# Patient Record
Sex: Female | Born: 1940
Health system: Southern US, Community
[De-identification: ages and names within clinical notes are randomized; demographics above are authoritative.]

## PROBLEM LIST (undated history)

## (undated) DIAGNOSIS — M1990 Unspecified osteoarthritis, unspecified site: Secondary | ICD-10-CM

## (undated) DIAGNOSIS — Z860100 Personal history of colon polyps, unspecified: Secondary | ICD-10-CM

## (undated) DIAGNOSIS — Q428 Congenital absence, atresia and stenosis of other parts of large intestine: Secondary | ICD-10-CM

## (undated) DIAGNOSIS — E7800 Pure hypercholesterolemia, unspecified: Secondary | ICD-10-CM

## (undated) DIAGNOSIS — I441 Atrioventricular block, second degree: Secondary | ICD-10-CM

## (undated) DIAGNOSIS — K589 Irritable bowel syndrome without diarrhea: Secondary | ICD-10-CM

## (undated) DIAGNOSIS — M810 Age-related osteoporosis without current pathological fracture: Secondary | ICD-10-CM

## (undated) DIAGNOSIS — M542 Cervicalgia: Secondary | ICD-10-CM

## (undated) DIAGNOSIS — Z973 Presence of spectacles and contact lenses: Secondary | ICD-10-CM

## (undated) DIAGNOSIS — I471 Supraventricular tachycardia, unspecified: Secondary | ICD-10-CM

## (undated) DIAGNOSIS — K449 Diaphragmatic hernia without obstruction or gangrene: Secondary | ICD-10-CM

## (undated) DIAGNOSIS — Z8719 Personal history of other diseases of the digestive system: Secondary | ICD-10-CM

## (undated) DIAGNOSIS — I10 Essential (primary) hypertension: Secondary | ICD-10-CM

## (undated) DIAGNOSIS — G4733 Obstructive sleep apnea (adult) (pediatric): Secondary | ICD-10-CM

## (undated) DIAGNOSIS — K219 Gastro-esophageal reflux disease without esophagitis: Secondary | ICD-10-CM

## (undated) HISTORY — DX: Essential (primary) hypertension: I10

## (undated) HISTORY — PX: CATARACT EXTRACTION W/ INTRAOCULAR LENS  IMPLANT, BILATERAL: SHX1307

## (undated) HISTORY — DX: Pure hypercholesterolemia, unspecified: E78.00

## (undated) HISTORY — PX: TUBAL LIGATION: SHX77

## (undated) HISTORY — DX: Age-related osteoporosis without current pathological fracture: M81.0

## (undated) HISTORY — PX: FOOT SURGERY: SHX648

## (undated) HISTORY — PX: COLONOSCOPY: SHX174

## (undated) HISTORY — DX: Unspecified osteoarthritis, unspecified site: M19.90

## (undated) HISTORY — PX: OTHER SURGICAL HISTORY: SHX169

## (undated) HISTORY — DX: Cervicalgia: M54.2

## (undated) HISTORY — PX: ESOPHAGOGASTRODUODENOSCOPY: SHX1529

## (undated) HISTORY — DX: Diaphragmatic hernia without obstruction or gangrene: K44.9

---

## 1998-05-15 ENCOUNTER — Other Ambulatory Visit: Admission: RE | Admit: 1998-05-15 | Discharge: 1998-05-15 | Payer: Self-pay

## 2000-10-06 ENCOUNTER — Ambulatory Visit (HOSPITAL_COMMUNITY): Admission: RE | Admit: 2000-10-06 | Discharge: 2000-10-06 | Payer: Self-pay | Admitting: Gastroenterology

## 2000-10-18 ENCOUNTER — Encounter: Admission: RE | Admit: 2000-10-18 | Discharge: 2000-10-18 | Payer: Self-pay | Admitting: Gastroenterology

## 2000-10-18 ENCOUNTER — Encounter: Payer: Self-pay | Admitting: Gastroenterology

## 2000-11-24 ENCOUNTER — Other Ambulatory Visit: Admission: RE | Admit: 2000-11-24 | Discharge: 2000-11-24 | Payer: Self-pay | Admitting: Gynecology

## 2000-11-24 ENCOUNTER — Encounter (INDEPENDENT_AMBULATORY_CARE_PROVIDER_SITE_OTHER): Payer: Self-pay

## 2001-01-19 ENCOUNTER — Encounter: Payer: Self-pay | Admitting: Gastroenterology

## 2001-01-19 ENCOUNTER — Encounter: Admission: RE | Admit: 2001-01-19 | Discharge: 2001-01-19 | Payer: Self-pay | Admitting: Gastroenterology

## 2001-10-19 ENCOUNTER — Encounter: Admission: RE | Admit: 2001-10-19 | Discharge: 2001-10-19 | Payer: Self-pay | Admitting: Family Medicine

## 2001-10-19 ENCOUNTER — Encounter: Payer: Self-pay | Admitting: Family Medicine

## 2001-12-04 ENCOUNTER — Encounter: Admission: RE | Admit: 2001-12-04 | Discharge: 2001-12-04 | Payer: Self-pay | Admitting: Neurosurgery

## 2001-12-04 ENCOUNTER — Encounter: Payer: Self-pay | Admitting: Neurosurgery

## 2002-01-16 ENCOUNTER — Encounter: Admission: RE | Admit: 2002-01-16 | Discharge: 2002-01-16 | Payer: Self-pay | Admitting: Neurosurgery

## 2002-01-16 ENCOUNTER — Encounter: Payer: Self-pay | Admitting: Neurosurgery

## 2002-01-31 ENCOUNTER — Encounter: Payer: Self-pay | Admitting: Neurosurgery

## 2002-01-31 ENCOUNTER — Encounter: Admission: RE | Admit: 2002-01-31 | Discharge: 2002-01-31 | Payer: Self-pay | Admitting: Neurosurgery

## 2002-02-13 ENCOUNTER — Other Ambulatory Visit: Admission: RE | Admit: 2002-02-13 | Discharge: 2002-02-13 | Payer: Self-pay | Admitting: Gynecology

## 2003-03-06 ENCOUNTER — Other Ambulatory Visit: Admission: RE | Admit: 2003-03-06 | Discharge: 2003-03-06 | Payer: Self-pay | Admitting: Gynecology

## 2004-01-01 ENCOUNTER — Ambulatory Visit (HOSPITAL_BASED_OUTPATIENT_CLINIC_OR_DEPARTMENT_OTHER): Admission: RE | Admit: 2004-01-01 | Discharge: 2004-01-01 | Payer: Self-pay | Admitting: Family Medicine

## 2004-01-13 ENCOUNTER — Ambulatory Visit (HOSPITAL_BASED_OUTPATIENT_CLINIC_OR_DEPARTMENT_OTHER): Admission: RE | Admit: 2004-01-13 | Discharge: 2004-01-13 | Payer: Self-pay | Admitting: Family Medicine

## 2004-04-14 ENCOUNTER — Other Ambulatory Visit: Admission: RE | Admit: 2004-04-14 | Discharge: 2004-04-14 | Payer: Self-pay | Admitting: Gynecology

## 2004-07-07 ENCOUNTER — Other Ambulatory Visit: Admission: RE | Admit: 2004-07-07 | Discharge: 2004-07-07 | Payer: Self-pay | Admitting: Gynecology

## 2005-05-23 ENCOUNTER — Other Ambulatory Visit: Admission: RE | Admit: 2005-05-23 | Discharge: 2005-05-23 | Payer: Self-pay | Admitting: Gynecology

## 2005-06-30 ENCOUNTER — Ambulatory Visit (HOSPITAL_COMMUNITY): Admission: RE | Admit: 2005-06-30 | Discharge: 2005-06-30 | Payer: Self-pay | Admitting: Orthopedic Surgery

## 2005-06-30 ENCOUNTER — Ambulatory Visit (HOSPITAL_BASED_OUTPATIENT_CLINIC_OR_DEPARTMENT_OTHER): Admission: RE | Admit: 2005-06-30 | Discharge: 2005-06-30 | Payer: Self-pay | Admitting: Orthopedic Surgery

## 2007-05-22 ENCOUNTER — Encounter: Admission: RE | Admit: 2007-05-22 | Discharge: 2007-05-22 | Payer: Self-pay | Admitting: Orthopedic Surgery

## 2010-10-06 ENCOUNTER — Encounter
Admission: RE | Admit: 2010-10-06 | Discharge: 2010-10-06 | Payer: Self-pay | Source: Home / Self Care | Attending: Family Medicine | Admitting: Family Medicine

## 2010-10-12 ENCOUNTER — Encounter
Admission: RE | Admit: 2010-10-12 | Discharge: 2010-10-12 | Payer: Self-pay | Source: Home / Self Care | Attending: Family Medicine | Admitting: Family Medicine

## 2011-02-11 NOTE — Op Note (Signed)
NAMEGUSTIE, Erika [name]                    ACCOUNT NO.:  [id number]   MEDICAL RECORD NO.:  [id number]          PATIENT TYPE:  AMB   LOCATION:  DSC                          FACILITY:  [facility]   PHYSICIAN:  Nadara Mustard, MD     DATE OF BIRTH:  1941-01-24   DATE OF PROCEDURE:  06/30/2005  DATE OF DISCHARGE:                                 OPERATIVE REPORT   PREOPERATIVE DIAGNOSIS:  1.  Right foot bunion, right great toe.  2.  Clawing of the right second toe.   PROCEDURE:  1.  Right first metatarsal Ludloff osteotomy.  2.  Right first proximal phalanx Aiken osteotomy.  3.  Second metatarsal Weil osteotomy.   SURGEON:  Nadara Mustard, M.D.   ANESTHESIA:  Popliteal block plus general.   ESTIMATED BLOOD LOSS:  Minimal.   ANTIBIOTICS:  1 gram of Kefzol.   TOURNIQUET TIME:  None.   DISPOSITION:  To PACU in stable condition.   INDICATIONS FOR PROCEDURE:  The patient is a 70 year old woman with a severe  hallux valgus deformity of the great toe on the right foot and clawing of  the second toe. She was overlapping the great toe over the second toe. She  has inner metatarsal angle of 20 degrees, hallux valgus angle of 40 degrees.  She has failed conservative care and presents at this time for surgical  intervention. The risks and benefits were discussed including infection,  neurovascular injury, persistent pain, need for additional surgery. The  patient states he understands and wished proceed at this time.   DESCRIPTION OF PROCEDURE:  The patient was brought to OR room #1 after  undergoing a popliteal block. The patient then underwent general anesthetic.  After adequate level of anesthesia obtained, the patient's right lower  extremity was prepped using DuraPrep and draped into a sterile field. A  medial longitudinal incision was made over the first metatarsal and base of  the proximal phalanx. An ostectomy was first performed and then a Ludloff  osteotomy was performed. This was rotated  and secured with 2.7 screws x2 and  this was lag screw technique to stabilize the rotation osteotomy. The  incision was made over the first web space to release the soft tissue  laterally to allow for the rotation of the Ludloff osteotomy. After securing  the osteotomy, the remainder of the overlapping bone was removed with the  saw. The wound was irrigated with normal saline. The capsule was closed  using 2-0 Vicryl. Skin was closed using 3-0 nylon with a 4 far-near-near-far  suture. A Aiken osteotomy was performed over the proximal phalanx. A medial  wedge was removed. The toe was straightened and a 1.6 mm K-wire was used to  stabilize the Aiken osteotomy. Attention was then focused on the second toe.  A Weil osteotomy was performed over the second metatarsal. This was secured  with a 2 mm screw 12 mm in length. The wound was irrigated. The incision  was closed using 3-0 nylon. The wounds were covered with Adaptic orthopedic  sponges, sterile Webril and a  Coban dressing. The patient was extubated,  taken to PACU in stable condition. Plan for discharge to home ice,  elevation, touchdown weightbearing on the right with crutches. Follow-up in  the office in two weeks.      Nadara Mustard, MD  Electronically Signed     MVD/MEDQ  D:  06/30/2005  T:  06/30/2005  Job:  [id number]

## 2011-02-11 NOTE — Procedures (Signed)
[facility]  Patient:    Erika [name], Erika [name]                             MRN: [id number] Proc. Date: 10/06/00 Adm. Date:  [id number] Attending:  Nelda Marseille CC:         Hadassah Pais. [name], M.D.   Procedure Report  PROCEDURE:  Colonoscopy.  INDICATION:  Mild change in bowel habits, abdominal pain since August. Consent was signed after risks, benefits, methods, and options thoroughly discussed in the office.  MEDICINES USED:  Demerol 50 mg, Versed 5 mg.  DESCRIPTION OF PROCEDURE:  Rectal inspection is pertinent for external hemorrhoids.  Digital exam was negative.  Video colonoscope was inserted and with mild difficulty due to a tortuous colon was able to advance to the cecum, which required rolling her on her back and some abdominal pressure.  Cecum was identified by the appendiceal orifice and the ileocecal valve.  In fact, the scope was inserted a short way into the terminal ileum, which was normal. Photo documentation was obtained.  No obvious abnormality was seen on insertion.  The scope was slowly withdrawn.  On slow withdrawal through the colon, the prep was good.  There were some bubbles that required washing with Mylicon wash and another little bit of liquid stool that required washing and suctioning, but on slow withdrawal through the colon no abnormalities were seen except for a rare diverticulum in both the transverse and the descending. There were no masses, polyps, or other abnormalities.  Once back in the rectum, the scope was then retroflexed, pertinent for some internal hemorrhoids.  The scope was straightened, readvanced a short way up the sigmoid, air was suctioned, and the scope removed.  The patient tolerated the procedure well.  There was no obvious immediate complication.  ENDOSCOPIC DIAGNOSES: 1. Internal-external hemorrhoids. 2. Rare transverse and descending diverticula seen. 3. Otherwise within normal limits to the  terminal ileum.  PLAN:  Repeat screening in five to 10 years.  Yearly rectals and guaiacs per Dr. [name].  Probably would get a CT scan next.  Happy to see back p.r.n. Possibly this pain is just due to adhesions, and might need if CT scan negative a one-time upper GI/small bowel follow-through. DD:  10/06/00 TD:  10/07/00 Job: [id number]

## 2011-03-22 ENCOUNTER — Other Ambulatory Visit: Payer: Self-pay | Admitting: Family Medicine

## 2011-03-22 DIAGNOSIS — R921 Mammographic calcification found on diagnostic imaging of breast: Secondary | ICD-10-CM

## 2011-03-29 ENCOUNTER — Ambulatory Visit
Admission: RE | Admit: 2011-03-29 | Discharge: 2011-03-29 | Disposition: A | Discharge status: Home / Self Care | Payer: Medicare Other | Source: Ambulatory Visit | Attending: Family Medicine | Admitting: Family Medicine

## 2011-03-29 DIAGNOSIS — R921 Mammographic calcification found on diagnostic imaging of breast: Secondary | ICD-10-CM

## 2011-08-30 ENCOUNTER — Other Ambulatory Visit: Payer: Self-pay | Admitting: Family Medicine

## 2011-08-30 DIAGNOSIS — R921 Mammographic calcification found on diagnostic imaging of breast: Secondary | ICD-10-CM

## 2011-10-25 ENCOUNTER — Ambulatory Visit
Admission: RE | Admit: 2011-10-25 | Discharge: 2011-10-25 | Disposition: A | Discharge status: Home / Self Care | Payer: Medicare Other | Source: Ambulatory Visit | Attending: Family Medicine | Admitting: Family Medicine

## 2011-10-25 DIAGNOSIS — R921 Mammographic calcification found on diagnostic imaging of breast: Secondary | ICD-10-CM

## 2012-02-02 ENCOUNTER — Other Ambulatory Visit: Payer: Self-pay | Admitting: Gynecology

## 2012-02-02 ENCOUNTER — Encounter: Payer: Self-pay | Admitting: Gynecology

## 2012-02-02 ENCOUNTER — Ambulatory Visit (INDEPENDENT_AMBULATORY_CARE_PROVIDER_SITE_OTHER): Payer: Medicare Other | Admitting: Gynecology

## 2012-02-02 VITALS — BP 140/82 | Ht 63.0 in | Wt 158.0 lb

## 2012-02-02 DIAGNOSIS — N949 Unspecified condition associated with female genital organs and menstrual cycle: Secondary | ICD-10-CM

## 2012-02-02 DIAGNOSIS — R1031 Right lower quadrant pain: Secondary | ICD-10-CM

## 2012-02-02 DIAGNOSIS — Z780 Asymptomatic menopausal state: Secondary | ICD-10-CM

## 2012-02-02 DIAGNOSIS — R5381 Other malaise: Secondary | ICD-10-CM

## 2012-02-02 DIAGNOSIS — R5383 Other fatigue: Secondary | ICD-10-CM

## 2012-02-02 LAB — URINALYSIS W MICROSCOPIC + REFLEX CULTURE
Crystals: NONE SEEN
Ketones, ur: NEGATIVE mg/dL
Nitrite: NEGATIVE
Protein, ur: NEGATIVE mg/dL
Specific Gravity, Urine: <1.005 — ABNORMAL LOW (ref 1.005–1.030)
Urobilinogen, UA: 0.2 mg/dL (ref 0.0–1.0)

## 2012-02-02 NOTE — Patient Instructions (Addendum)
Follow up for ultrasound and bone density study.

## 2012-02-02 NOTE — Progress Notes (Signed)
Erika [name] 1940-12-23 [id number]        71 y.o.  Presents having not been seen in the office for a number of years complaining of right lower quadrant pain. Patient notes over the last 6 months or so a nagging right lower quadrant discomfort on a daily basis. She is being followed for IBS by Dr. Claretha [name]. She reports having a recent colonoscopy last fall. No weight gain weight loss nausea vomiting diarrhea constipation associated with the pain. No urinary symptoms. I asked her about diverticulitis or diverticulosis and she's never been diagnosed with this.  Recently saw Dr. Jeannetta [name] and had blood work drawn but is not sure of the results. She is noting some fatigue over the past year also although is very active taking care of her grandchildren.  Past medical history,surgical history, medications, allergies, family history and social history were all reviewed and documented in the EPIC chart. ROS:  Was performed and pertinent positives and negatives are included in the history.  Exam: Erika [name] chaperone present Filed Vitals:   02/02/12 1100  BP: 140/82   General appearance  Normal Skin grossly normal Head/Neck normal with no cervical or supraclavicular adenopathy thyroid normal Lungs  clear Cardiac RR, without RMG Abdominal  soft, mild tenderness over McBurney's point, without masses, organomegaly or hernia Breasts  examined lying and sitting without masses, retractions, discharge or axillary adenopathy. Pelvic  Ext/BUS/vagina  normal with atrophic genital changes  Cervix  normal   Uterus  axial, normal size, shape and contour, midline and mobile nontender   Adnexa  Without masses or tenderness    Anus and perineum  normal   Rectovaginal  normal sphincter tone without palpated masses or tenderness.    Assessment/Plan:  71 y.o. with  1. 6 months of right lower quadrant discomfort over McBurney's point. No acute changes.  Is actively being followed for IBS with recent colonoscopy. Will start with  GYN ultrasound to rule out nonpalpable abnormalities. Assuming negative I have asked her to see Dr. Claretha [name] in follow up to see if any other tests such as CT scan would be appropriate for her and his recommendation.  A urinalysis today did show some trace bacteria and will follow up with a culture and treat if appropriate 2. Mammogram. Patient had her mammography in January has been followed at a six-month interval 4 stable changes. She'll continue follow up with them per their recommendation. SBE monthly reviewed. 3. Pap smear. Patient has no history of abnormal Pap smears and had historically been getting them up until the last several years. Discussed current screening guidelines and a Pap smear was done today as she is over the age of 32 and we'll plan on no further screening. 4. DEXA. Patient had a DEXA historically 8 years ago. We'll repeat now and she will schedule. Increase calcium vitamin D reviewed. 5. Fatigue. She's not sure what blood work Dr. Jeannetta [name] ordered and I recommended a CBC and TSH and she can follow up for this. 6. Health maintenance. No other blood work was done as a result on for Dr. Milus [name] office who she sees a regular basis. To follow up with me for her ultrasound and we'll go from there.   Erika Lords MD, 11:24 AM 02/02/2012

## 2012-02-02 NOTE — Progress Notes (Signed)
Addended by: [name] on: 02/02/2012 11:46 AM   Modules accepted: Orders

## 2012-02-04 LAB — URINE CULTURE: Organism ID, Bacteria: NO GROWTH

## 2012-02-07 ENCOUNTER — Ambulatory Visit (INDEPENDENT_AMBULATORY_CARE_PROVIDER_SITE_OTHER): Payer: Medicare Other

## 2012-02-07 DIAGNOSIS — M8580 Other specified disorders of bone density and structure, unspecified site: Secondary | ICD-10-CM

## 2012-02-07 DIAGNOSIS — Z780 Asymptomatic menopausal state: Secondary | ICD-10-CM

## 2012-02-07 DIAGNOSIS — M949 Disorder of cartilage, unspecified: Secondary | ICD-10-CM

## 2012-02-08 ENCOUNTER — Telehealth: Payer: Self-pay | Admitting: Gynecology

## 2012-02-08 DIAGNOSIS — R5383 Other fatigue: Secondary | ICD-10-CM

## 2012-02-08 DIAGNOSIS — M8580 Other specified disorders of bone density and structure, unspecified site: Secondary | ICD-10-CM

## 2012-02-08 NOTE — Telephone Encounter (Signed)
Patient's DEXA showed osteopenia with an indication to consider medication treatment. Asked patient to make appointment to see me for discussion. She also needs to have the CBC and TSH done that apparently was not drawn at her visit as well as a vitamin D level. I put in for the vitamin D and the CBC and TSH are listed as and overdo result.  Have these done before her office visit to see me.

## 2012-02-08 NOTE — Telephone Encounter (Signed)
Pt informed with the below note. 

## 2012-02-10 ENCOUNTER — Ambulatory Visit (INDEPENDENT_AMBULATORY_CARE_PROVIDER_SITE_OTHER): Payer: Medicare Other

## 2012-02-10 ENCOUNTER — Encounter: Payer: Self-pay | Admitting: Gynecology

## 2012-02-10 ENCOUNTER — Ambulatory Visit (INDEPENDENT_AMBULATORY_CARE_PROVIDER_SITE_OTHER): Payer: Medicare Other | Admitting: Gynecology

## 2012-02-10 DIAGNOSIS — E559 Vitamin D deficiency, unspecified: Secondary | ICD-10-CM

## 2012-02-10 DIAGNOSIS — R5383 Other fatigue: Secondary | ICD-10-CM

## 2012-02-10 DIAGNOSIS — R1031 Right lower quadrant pain: Secondary | ICD-10-CM

## 2012-02-10 DIAGNOSIS — M899 Disorder of bone, unspecified: Secondary | ICD-10-CM

## 2012-02-10 DIAGNOSIS — R5381 Other malaise: Secondary | ICD-10-CM

## 2012-02-10 DIAGNOSIS — M8580 Other specified disorders of bone density and structure, unspecified site: Secondary | ICD-10-CM

## 2012-02-10 LAB — CBC WITH DIFFERENTIAL/PLATELET
Basophils Absolute: 0 10*3/uL (ref 0.0–0.1)
Basophils Relative: 1 % (ref 0–1)
Eosinophils Absolute: 0.2 10*3/uL (ref 0.0–0.7)
Hemoglobin: 12 g/dL (ref 12.0–15.0)
MCH: 27.3 pg (ref 26.0–34.0)
MCHC: 32.6 g/dL (ref 30.0–36.0)
Neutro Abs: 4.5 10*3/uL (ref 1.7–7.7)
Neutrophils Relative %: 58 % (ref 43–77)
Platelets: 318 10*3/uL (ref 150–400)
RDW: 13.8 % (ref 11.5–15.5)

## 2012-02-10 LAB — TSH: TSH: 1.539 u[IU]/mL (ref 0.350–4.500)

## 2012-02-10 NOTE — Progress Notes (Signed)
Patient presents for 2 issues: 1. History right lower quadrant pain. I think his GI historically per prior office note but ordered an ultrasound just to make sure his not ovarian in origin. 2. Recent DEXA shows osteopenia with T score -2.3. FRAX with 10 year probability of fracture overall 14% and hip fracture 3.5%  Assessment and plan: 1. Ultrasound shows endometrial echo 4.9 mm with no abnormalities. Right and left ovaries visualized and postmenopausal without abnormalities. No free fluid in the cul-de-sac. Reviewed with patient I think her pain is GI related and she is going to follow up with her gastroenterologist. 2. Osteopenia. I reviewed her FRAX showing an increased risk at the hip and the options for treatment reviewed. After lengthy discussion she wants to go ahead and start on alendronate 70 mg weekly. I reviewed how to take the medication and the side effect profile. The risks of exacerbating her GERD, long-term risk of esophageal cancer, osteonecrosis of the jaw and atypical fractures were all reviewed understood and accepted. Patient going to start the medication and let me know if she has any issues. We'll plan on repeating her DEXA in 2 years. I did order a baseline vitamin D TSH in reference to this.

## 2012-02-10 NOTE — Patient Instructions (Signed)
Start Fosamax (alendronate) as directed. Call me if you have any issues. Follow up with your gastroenterologist in reference to your abdominal pain.

## 2012-02-11 LAB — VITAMIN D 25 HYDROXY (VIT D DEFICIENCY, FRACTURES): Vit D, 25-Hydroxy: 27 ng/mL — ABNORMAL LOW (ref 30–89)

## 2012-02-14 NOTE — Progress Notes (Signed)
Addended by: Venora Maples on: 02/14/2012 03:41 PM   Modules accepted: Orders

## 2012-02-15 ENCOUNTER — Other Ambulatory Visit: Payer: Self-pay | Admitting: Gastroenterology

## 2012-02-15 ENCOUNTER — Telehealth: Payer: Self-pay | Admitting: *Deleted

## 2012-02-15 MED ORDER — ALENDRONATE SODIUM 70 MG PO TABS: 70.0000 mg | ORAL_TABLET | ORAL | Status: AC

## 2012-02-15 NOTE — Telephone Encounter (Signed)
Pt said that pharmacy never received her rx for fosamax 70 mg 1 weekly, rx sent per OV 02/10/12, left this on pt voicemail.

## 2012-02-21 ENCOUNTER — Ambulatory Visit
Admission: RE | Admit: 2012-02-21 | Discharge: 2012-02-21 | Disposition: A | Discharge status: Home / Self Care | Payer: Medicare Other | Source: Ambulatory Visit | Attending: Gastroenterology | Admitting: Gastroenterology

## 2012-02-21 MED ORDER — IOHEXOL 300 MG/ML  SOLN
100.0000 mL | Freq: Once | INTRAMUSCULAR | Status: AC | PRN
  Administered 2012-02-21: 100 mL via INTRAVENOUS

## 2012-08-28 ENCOUNTER — Other Ambulatory Visit: Payer: Self-pay | Admitting: Cardiology

## 2012-08-28 ENCOUNTER — Ambulatory Visit
Admission: RE | Admit: 2012-08-28 | Discharge: 2012-08-28 | Disposition: A | Discharge status: Home / Self Care | Payer: Medicare Other | Source: Ambulatory Visit | Attending: Cardiology | Admitting: Cardiology

## 2012-08-28 DIAGNOSIS — E785 Hyperlipidemia, unspecified: Secondary | ICD-10-CM | POA: Insufficient documentation

## 2012-08-28 DIAGNOSIS — R079 Chest pain, unspecified: Secondary | ICD-10-CM

## 2012-08-28 DIAGNOSIS — K589 Irritable bowel syndrome without diarrhea: Secondary | ICD-10-CM | POA: Insufficient documentation

## 2012-11-02 ENCOUNTER — Other Ambulatory Visit: Payer: Self-pay | Admitting: Family Medicine

## 2012-11-02 DIAGNOSIS — R921 Mammographic calcification found on diagnostic imaging of breast: Secondary | ICD-10-CM

## 2012-11-16 ENCOUNTER — Ambulatory Visit
Admission: RE | Admit: 2012-11-16 | Discharge: 2012-11-16 | Disposition: A | Discharge status: Home / Self Care | Payer: Medicare Other | Source: Ambulatory Visit | Attending: Family Medicine | Admitting: Family Medicine

## 2012-11-16 DIAGNOSIS — R921 Mammographic calcification found on diagnostic imaging of breast: Secondary | ICD-10-CM

## 2013-04-22 ENCOUNTER — Encounter: Payer: Self-pay | Admitting: Neurology

## 2013-04-23 ENCOUNTER — Ambulatory Visit (INDEPENDENT_AMBULATORY_CARE_PROVIDER_SITE_OTHER): Payer: Medicare Other | Admitting: Neurology

## 2013-04-23 ENCOUNTER — Encounter: Payer: Self-pay | Admitting: Neurology

## 2013-04-23 VITALS — BP 141/88 | HR 98 | Ht 63.0 in | Wt 142.0 lb

## 2013-04-23 DIAGNOSIS — M25511 Pain in right shoulder: Secondary | ICD-10-CM | POA: Insufficient documentation

## 2013-04-23 DIAGNOSIS — E785 Hyperlipidemia, unspecified: Secondary | ICD-10-CM

## 2013-04-23 DIAGNOSIS — M542 Cervicalgia: Secondary | ICD-10-CM

## 2013-04-23 DIAGNOSIS — K589 Irritable bowel syndrome without diarrhea: Secondary | ICD-10-CM

## 2013-04-23 DIAGNOSIS — M25519 Pain in unspecified shoulder: Secondary | ICD-10-CM

## 2013-04-23 MED ORDER — BUPRENORPHINE 5 MCG/HR TD PTWK: 5.0000 ug | MEDICATED_PATCH | TRANSDERMAL | Status: DC

## 2013-04-23 NOTE — Progress Notes (Signed)
  History of Present Illness: Erika [name] is a 72 years old right-handed Caucasian female, referred by her primary care physician and orthopedic surgeon  Dr. Donnella Sham for evaluation of right shoulder pain and right neck pain  Since Summer of 2013, without clear trigger event, she began to notice subacute onset of difficulty raising her right arm overhead, few days later, she noticed tenderness in her right deltoid region, later radiating to right shoulder blade, she was evaluated by orthopedic surgeon Dr. August Saucer, MRI of right shoulder showed some tendinitis, she was given Tylenol 3, shoulder injection without improvement, she complains of severe pain as if sawblade at her right glenohumeral joint , also radiating pain to her right neck, MRI cervical spine at [facility] imaging showed multiple degenerative disc disease, no significant canal or foraminal stenosis,  She had 2 right shoulder injection, 1 right upper cervical epidural injection without improvement, she denies gait difficulty, no right hand paresthesia, no incontinence, no radiating pain to her right arm  UPDATE July 29th 2014:   She is overall doing about the same, continued to have right-sided neck pain, right shoulder pain, has to lie down with her right arm stretched to relieve her right shoulder pain, she denies bilateral upper extremity motor or sensory deficit.  EMG nerve conduction study was normal, there was no evidence of right upper extremity neuropathy, or right cervical radiculopathy.    Physical Exam  Neck: supple no carotid bruits Respiratory: clear to auscultation bilaterally Cardiovascular: regular rate rhythm  Neurologic Exam  Mental Status: pleasant, awake, alert, cooperative to history, talking, and casual conversation. Cranial Nerves: CN II-XII pupils were equal round reactive to light.  Fundi were sharp bilaterally.  Extraocular movements were full.  Visual fields were full on confrontational test.  Facial sensation and  strength were normal.  Hearing was intact to finger rubbing bilaterally.  Uvula tongue were midline.  Head turning and shoulder shrugging were normal and symmetric.  Tongue protrusion into the cheeks strength were normal.  Motor: Normal tone, bulk, and strength. Tenderness of right biceps tendon upon deep palpitation Sensory: Normal to light touch, pinprick, proprioception, and vibratory sensation. Coordination: Normal finger-to-nose, heel-to-shin.  There was no dysmetria noticed. Gait and Station: Narrow based and steady, was able to perform tiptoe, heel, and tandem walking without difficulty.  Romberg sign: Negative Reflexes: Deep tendon reflexes: Biceps: 2/2, Brachioradialis: 2/2, Triceps: 2/2, Pateller: 2/2, Achilles: 2/2.  Plantar responses are flexor.   Assessment and Plan: 72 years old right-handed Caucasian female, with right biceps tendon tenderness upon deep palpation, normal neurological examination,  1, Most likely right shoulder tendinitis (right shoulder pathology), less likely due to right cervical radiculopathy 2. Refer her to physical therapy,. 3. Buprenorphine patach q week. 4. RTC in 3 months

## 2013-04-26 ENCOUNTER — Telehealth: Payer: Self-pay | Admitting: Neurology

## 2013-04-26 NOTE — Telephone Encounter (Signed)
This is a controlled substance medication with no generic available.  The patient is requesting Morphine Sulfate, which is a CII narcotic.  This cannot be called in either.  Dr Terrace Arabia is out of the office.  Forwarding request to Dr Pearlean Brownie, [facility] for review.

## 2013-04-26 NOTE — Telephone Encounter (Signed)
Jess,   Can you call in the generic or do the physician need to? Please advise.

## 2013-05-13 ENCOUNTER — Telehealth: Payer: Self-pay | Admitting: Neurology

## 2013-05-13 DIAGNOSIS — M542 Cervicalgia: Secondary | ICD-10-CM | POA: Insufficient documentation

## 2013-05-20 NOTE — Telephone Encounter (Signed)
Chart reviewed, I will not write morphine

## 2013-06-18 NOTE — Addendum Note (Signed)
Addended byHermenia Fiscal on: 06/18/2013 09:16 AM   Modules accepted: Orders

## 2013-08-09 ENCOUNTER — Other Ambulatory Visit: Payer: Self-pay | Admitting: Gastroenterology

## 2013-08-09 NOTE — Addendum Note (Signed)
Addended by: Joseeduardo Brix on: 08/09/2013 01:47 PM   Modules accepted: Orders  

## 2013-08-14 ENCOUNTER — Ambulatory Visit (HOSPITAL_COMMUNITY)
Admission: RE | Admit: 2013-08-14 | Discharge: 2013-08-14 | Disposition: A | Discharge status: Home / Self Care | Payer: Medicare Other | Source: Ambulatory Visit | Attending: Gastroenterology | Admitting: Gastroenterology

## 2013-08-14 ENCOUNTER — Encounter (HOSPITAL_COMMUNITY): Payer: Self-pay

## 2013-08-14 ENCOUNTER — Encounter (HOSPITAL_COMMUNITY)
Admission: RE | Disposition: A | Discharge status: Home / Self Care | Payer: Self-pay | Source: Ambulatory Visit | Attending: Gastroenterology

## 2013-08-14 DIAGNOSIS — K449 Diaphragmatic hernia without obstruction or gangrene: Secondary | ICD-10-CM | POA: Insufficient documentation

## 2013-08-14 DIAGNOSIS — D131 Benign neoplasm of stomach: Secondary | ICD-10-CM | POA: Insufficient documentation

## 2013-08-14 DIAGNOSIS — D1330 Benign neoplasm of unspecified part of small intestine: Secondary | ICD-10-CM | POA: Insufficient documentation

## 2013-08-14 DIAGNOSIS — K2940 Chronic atrophic gastritis without bleeding: Secondary | ICD-10-CM | POA: Insufficient documentation

## 2013-08-14 DIAGNOSIS — A048 Other specified bacterial intestinal infections: Secondary | ICD-10-CM | POA: Insufficient documentation

## 2013-08-14 HISTORY — PX: HOT HEMOSTASIS: SHX5433

## 2013-08-14 HISTORY — PX: ESOPHAGOGASTRODUODENOSCOPY: SHX5428

## 2013-08-14 SURGERY — EGD (ESOPHAGOGASTRODUODENOSCOPY)
Anesthesia: Moderate Sedation

## 2013-08-14 MED ORDER — FENTANYL CITRATE 0.05 MG/ML IJ SOLN
INTRAMUSCULAR | Status: DC | PRN
  Administered 2013-08-14 (×2): 25 ug via INTRAVENOUS

## 2013-08-14 MED ORDER — SODIUM CHLORIDE 0.9 % IV SOLN
INTRAVENOUS | Status: DC
  Administered 2013-08-14: 500 mL via INTRAVENOUS

## 2013-08-14 MED ORDER — MIDAZOLAM HCL 10 MG/2ML IJ SOLN
INTRAMUSCULAR | Status: AC
  Filled 2013-08-14: qty 2

## 2013-08-14 MED ORDER — BUTAMBEN-TETRACAINE-BENZOCAINE 2-2-14 % EX AERO
INHALATION_SPRAY | CUTANEOUS | Status: DC | PRN
  Administered 2013-08-14: 2 via TOPICAL

## 2013-08-14 MED ORDER — MIDAZOLAM HCL 10 MG/2ML IJ SOLN
INTRAMUSCULAR | Status: DC | PRN
  Administered 2013-08-14: 1 mg via INTRAVENOUS
  Administered 2013-08-14: 2 mg via INTRAVENOUS
  Administered 2013-08-14: 1 mg via INTRAVENOUS
  Administered 2013-08-14: 2 mg via INTRAVENOUS

## 2013-08-14 MED ORDER — FENTANYL CITRATE 0.05 MG/ML IJ SOLN
INTRAMUSCULAR | Status: AC
  Filled 2013-08-14: qty 2

## 2013-08-14 NOTE — Op Note (Signed)
[facility] [address] [place], [address]   ENDOSCOPY PROCEDURE REPORT  PATIENT: [name], Erika B.  MR#: [id number] BIRTHDATE: 07-02-1941 , 72  yrs. old GENDER: Female  ENDOSCOPIST: Vida Rigger, MD REFERRED [id number] [name], M.D.  PROCEDURE DATE:  08/14/2013 PROCEDURE:   EGD w/ biopsy and EGD w/ snare technique ASA CLASS:   Class II INDICATIONS:therapy of benign tumor /polyp of duodenum.  MEDICATIONS: Fentanyl 50 mcg IV and Versed 6 mg IV  TOPICAL ANESTHETIC:used  DESCRIPTION OF PROCEDURE:   After the risks benefits and alternatives of the procedure were thoroughly explained, informed consent was obtained.  The Pentax Gastroscope [id number]  endoscope was introduced through the mouth and advanced to the third portion of the duodenum , limited by Without limitations.   The instrument was slowly withdrawn as the mucosa was fully examined.the findings are reported below and the duodenal polyp was seen without worrisome stigmata and initially two cold snares were done and then 1 minimal hot snare using a setting of 150 and 15 and then a few more cold snare and most but probably not all of the pieces were recovered and then we cold biopsied the base multiple times and then we completed the endoscopy in the customary fashion and the patient tolerated the procedure well there was no obvious immediate complication            FINDINGS:1. Moderately large hiatal hernia 2. Small proximal gastric polyp status post biopsy 3. Mild gastritis 4. Small second portion of the duodenum polyp status post 1 minimal hot snare few cold snares and a few cold biopsy 5 otherwise within normal limits to the third part of the duodenum  COMPLICATIONS:no  ENDOSCOPIC IMPRESSION:above   RECOMMENDATIONS:await pathology probably repeat EGD in 2 years GI followup when necessary   REPEAT EXAM: when necessary or pending biopsy   _______________________________ Vida Rigger,  MD eSigned:  Vida Rigger, MD 08/14/2013 9:21 AM    FA:[id number] [name], MD  PATIENT NAME:  [name], Erika B. MR#: [id number]

## 2013-08-15 ENCOUNTER — Encounter (HOSPITAL_COMMUNITY): Payer: Self-pay | Admitting: Gastroenterology

## 2013-10-07 ENCOUNTER — Other Ambulatory Visit: Payer: Self-pay

## 2013-10-24 ENCOUNTER — Ambulatory Visit: Payer: Medicare Other | Admitting: Neurology

## 2013-11-28 ENCOUNTER — Other Ambulatory Visit: Payer: Self-pay

## 2013-11-28 ENCOUNTER — Other Ambulatory Visit: Payer: Self-pay | Admitting: Family Medicine

## 2013-11-28 DIAGNOSIS — Z1231 Encounter for screening mammogram for malignant neoplasm of breast: Secondary | ICD-10-CM

## 2013-12-13 ENCOUNTER — Ambulatory Visit
Admission: RE | Admit: 2013-12-13 | Discharge: 2013-12-13 | Disposition: A | Discharge status: Home / Self Care | Payer: Medicare Other | Source: Ambulatory Visit

## 2013-12-13 DIAGNOSIS — Z1231 Encounter for screening mammogram for malignant neoplasm of breast: Secondary | ICD-10-CM

## 2014-07-28 ENCOUNTER — Encounter (HOSPITAL_COMMUNITY): Payer: Self-pay | Admitting: Gastroenterology

## 2014-09-09 ENCOUNTER — Other Ambulatory Visit: Payer: Self-pay | Admitting: Family Medicine

## 2014-09-09 ENCOUNTER — Ambulatory Visit
Admission: RE | Admit: 2014-09-09 | Discharge: 2014-09-09 | Disposition: A | Discharge status: Home / Self Care | Payer: Medicare Other | Source: Ambulatory Visit | Attending: Family Medicine | Admitting: Family Medicine

## 2014-09-09 DIAGNOSIS — R51 Headache: Principal | ICD-10-CM

## 2014-09-09 DIAGNOSIS — R413 Other amnesia: Secondary | ICD-10-CM

## 2014-09-09 DIAGNOSIS — R519 Headache, unspecified: Secondary | ICD-10-CM

## 2014-10-27 ENCOUNTER — Other Ambulatory Visit: Payer: Self-pay

## 2014-10-27 DIAGNOSIS — Z1231 Encounter for screening mammogram for malignant neoplasm of breast: Secondary | ICD-10-CM

## 2014-10-30 ENCOUNTER — Other Ambulatory Visit: Payer: Self-pay | Admitting: Family Medicine

## 2014-10-30 DIAGNOSIS — N6320 Unspecified lump in the left breast, unspecified quadrant: Secondary | ICD-10-CM

## 2014-11-26 ENCOUNTER — Ambulatory Visit
Admission: RE | Admit: 2014-11-26 | Discharge: 2014-11-26 | Disposition: A | Discharge status: Home / Self Care | Payer: Medicare Other | Source: Ambulatory Visit | Attending: Family Medicine | Admitting: Family Medicine

## 2014-11-26 DIAGNOSIS — N6320 Unspecified lump in the left breast, unspecified quadrant: Secondary | ICD-10-CM

## 2015-03-27 ENCOUNTER — Other Ambulatory Visit: Payer: Self-pay | Admitting: Family Medicine

## 2015-03-27 DIAGNOSIS — R221 Localized swelling, mass and lump, neck: Secondary | ICD-10-CM

## 2015-03-28 ENCOUNTER — Ambulatory Visit
Admission: RE | Admit: 2015-03-28 | Discharge: 2015-03-28 | Disposition: A | Discharge status: Home / Self Care | Payer: Medicare Other | Source: Ambulatory Visit | Attending: Family Medicine | Admitting: Family Medicine

## 2015-03-28 DIAGNOSIS — R221 Localized swelling, mass and lump, neck: Secondary | ICD-10-CM

## 2015-03-28 MED ORDER — GADOBENATE DIMEGLUMINE 529 MG/ML IV SOLN
14.0000 mL | Freq: Once | INTRAVENOUS | Status: AC | PRN
  Administered 2015-03-28: 14 mL via INTRAVENOUS

## 2015-07-24 ENCOUNTER — Other Ambulatory Visit: Payer: Self-pay | Admitting: Otolaryngology

## 2015-07-24 DIAGNOSIS — R221 Localized swelling, mass and lump, neck: Secondary | ICD-10-CM

## 2015-07-31 ENCOUNTER — Ambulatory Visit
Admission: RE | Admit: 2015-07-31 | Discharge: 2015-07-31 | Disposition: A | Discharge status: Home / Self Care | Payer: Medicare Other | Source: Ambulatory Visit | Attending: Otolaryngology | Admitting: Otolaryngology

## 2015-07-31 DIAGNOSIS — R221 Localized swelling, mass and lump, neck: Secondary | ICD-10-CM

## 2015-07-31 MED ORDER — IOPAMIDOL (ISOVUE-300) INJECTION 61%
75.0000 mL | Freq: Once | INTRAVENOUS | Status: AC | PRN
  Administered 2015-07-31: 75 mL via INTRAVENOUS

## 2015-09-17 ENCOUNTER — Encounter (HOSPITAL_COMMUNITY): Payer: Self-pay | Admitting: *Deleted

## 2015-09-29 ENCOUNTER — Other Ambulatory Visit: Payer: Self-pay | Admitting: Gastroenterology

## 2015-09-29 ENCOUNTER — Ambulatory Visit (HOSPITAL_COMMUNITY): Payer: Medicare Other | Admitting: Certified Registered Nurse Anesthetist

## 2015-09-29 ENCOUNTER — Ambulatory Visit (HOSPITAL_COMMUNITY)
Admission: RE | Admit: 2015-09-29 | Discharge: 2015-09-29 | Disposition: A | Discharge status: Home / Self Care | Payer: Medicare Other | Source: Ambulatory Visit | Attending: Gastroenterology | Admitting: Gastroenterology

## 2015-09-29 ENCOUNTER — Encounter (HOSPITAL_COMMUNITY): Payer: Self-pay | Admitting: Anesthesiology

## 2015-09-29 ENCOUNTER — Encounter (HOSPITAL_COMMUNITY)
Admission: RE | Disposition: A | Discharge status: Home / Self Care | Payer: Self-pay | Source: Ambulatory Visit | Attending: Gastroenterology

## 2015-09-29 DIAGNOSIS — A048 Other specified bacterial intestinal infections: Secondary | ICD-10-CM | POA: Insufficient documentation

## 2015-09-29 DIAGNOSIS — K449 Diaphragmatic hernia without obstruction or gangrene: Secondary | ICD-10-CM | POA: Insufficient documentation

## 2015-09-29 DIAGNOSIS — I10 Essential (primary) hypertension: Secondary | ICD-10-CM | POA: Diagnosis not present

## 2015-09-29 DIAGNOSIS — K317 Polyp of stomach and duodenum: Secondary | ICD-10-CM | POA: Insufficient documentation

## 2015-09-29 DIAGNOSIS — Z79899 Other long term (current) drug therapy: Secondary | ICD-10-CM | POA: Insufficient documentation

## 2015-09-29 DIAGNOSIS — R12 Heartburn: Secondary | ICD-10-CM | POA: Diagnosis present

## 2015-09-29 DIAGNOSIS — K589 Irritable bowel syndrome without diarrhea: Secondary | ICD-10-CM | POA: Diagnosis not present

## 2015-09-29 DIAGNOSIS — K219 Gastro-esophageal reflux disease without esophagitis: Secondary | ICD-10-CM | POA: Insufficient documentation

## 2015-09-29 DIAGNOSIS — Z9851 Tubal ligation status: Secondary | ICD-10-CM | POA: Diagnosis not present

## 2015-09-29 DIAGNOSIS — D132 Benign neoplasm of duodenum: Secondary | ICD-10-CM | POA: Diagnosis not present

## 2015-09-29 HISTORY — PX: HOT HEMOSTASIS: SHX5433

## 2015-09-29 HISTORY — DX: Irritable bowel syndrome, unspecified: K58.9

## 2015-09-29 HISTORY — DX: Gastro-esophageal reflux disease without esophagitis: K21.9

## 2015-09-29 HISTORY — PX: ESOPHAGOGASTRODUODENOSCOPY (EGD) WITH PROPOFOL: SHX5813

## 2015-09-29 SURGERY — ESOPHAGOGASTRODUODENOSCOPY (EGD) WITH PROPOFOL
Anesthesia: Monitor Anesthesia Care

## 2015-09-29 MED ORDER — SODIUM CHLORIDE 0.9 % IJ SOLN
INTRAMUSCULAR | Status: AC
  Filled 2015-09-29: qty 10

## 2015-09-29 MED ORDER — LIDOCAINE HCL (CARDIAC) 20 MG/ML IV SOLN
INTRAVENOUS | Status: AC
  Filled 2015-09-29: qty 5

## 2015-09-29 MED ORDER — PROPOFOL 500 MG/50ML IV EMUL
INTRAVENOUS | Status: DC | PRN
  Administered 2015-09-29: 100 ug/kg/min via INTRAVENOUS

## 2015-09-29 MED ORDER — PROPOFOL 10 MG/ML IV BOLUS
INTRAVENOUS | Status: DC | PRN
  Administered 2015-09-29: 20 mg via INTRAVENOUS
  Administered 2015-09-29 (×2): 10 mg via INTRAVENOUS

## 2015-09-29 MED ORDER — LACTATED RINGERS IV SOLN
INTRAVENOUS | Status: DC
  Administered 2015-09-29: 1000 mL via INTRAVENOUS

## 2015-09-29 MED ORDER — ONDANSETRON HCL 4 MG/2ML IJ SOLN
INTRAMUSCULAR | Status: AC
  Filled 2015-09-29: qty 2

## 2015-09-29 MED ORDER — PROPOFOL 10 MG/ML IV BOLUS
INTRAVENOUS | Status: AC
  Filled 2015-09-29: qty 20

## 2015-09-29 MED ORDER — SODIUM CHLORIDE 0.9 % IV SOLN: INTRAVENOUS | Status: DC

## 2015-09-29 MED ORDER — PROPOFOL 10 MG/ML IV BOLUS
INTRAVENOUS | Status: AC
  Filled 2015-09-29: qty 40

## 2015-09-29 MED ORDER — EPHEDRINE SULFATE 50 MG/ML IJ SOLN
INTRAMUSCULAR | Status: AC
  Filled 2015-09-29: qty 1

## 2015-09-29 MED ORDER — LIDOCAINE HCL (CARDIAC) 20 MG/ML IV SOLN
INTRAVENOUS | Status: DC | PRN
  Administered 2015-09-29: 80 mg via INTRAVENOUS

## 2015-09-29 MED ORDER — ONDANSETRON HCL 4 MG/2ML IJ SOLN
INTRAMUSCULAR | Status: DC | PRN
  Administered 2015-09-29: 4 mg via INTRAVENOUS

## 2015-09-29 SURGICAL SUPPLY — 15 items

## 2015-09-29 NOTE — Op Note (Signed)
[facility] [place], [address]   ENDOSCOPY PROCEDURE REPORT  PATIENT: Erika [name], Erika [name]  MR#: [id number] BIRTHDATE: 03/06/1941 , 74  yrs. old GENDER: female ENDOSCOPIST: Clarene Essex, MD REFERRED BY:  Claris Gower, M.D. PROCEDURE DATE:  10/27/2015 PROCEDURE:  EGD w/ snare polypectomy and EGD w/APC ablation ASA CLASS:     Class II INDICATIONS:  heartburn and follow-up of benign tumor of duodenum. MEDICATIONS: Propofol 180 mg IV  idocaine 80 mg TOPICAL ANESTHETIC: none  DESCRIPTION OF PROCEDURE: After the risks benefits and alternatives of the procedure were thoroughly explained, informed consent was obtained.  The Pentax Gastroscope [id number] endoscope was introduced through the mouth and advanced to the second portion of the duodenum , Without limitations.  The instrument was slowly withdrawn as the mucosa was fully examined. Estimated blood loss is zero unless otherwise noted in this procedure report.    the findings are recorded below       Retroflexed views revealed a hiatal hernia and small polyp.     The scope was then withdrawn from the patient and the procedure completed.  COMPLICATIONS: There were no immediate complications.  ENDOSCOPIC IMPRESSION: 1. Moderate hiatal hernia 2. Small hiatal hernia polyp status post hot snareand APC base to control oozing after polypectomy3. Second portion of the duodenum small polyp status post to hot snares 1 cold snare and APC 4. Otherwise within normal limits EGD except for some mild gastritis as well  RECOMMENDATIONS: await pathology probably repeat endoscopy in 2-3 years call me when necessary and follow up when necessary  REPEAT EXAM: as needed   pending pathology  eSigned:  Clarene Essex, MD 27-Oct-2015 1:53 PM    CC:  CPT CODES: ICD CODES:  The ICD and CPT codes recommended by this software are interpretations from the data that the clinical staff has captured with the software.  The  verification of the translation of this report to the ICD and CPT codes and modifiers is the sole responsibility of the health care institution and practicing physician where this report was generated.  [employer] will not be held responsible for the validity of the ICD and CPT codes included on this report.  AMA assumes no liability for data contained or not contained herein. CPT is a Designer, television/film set of the [employer].  PATIENT NAME:  [name], [name] MR#: [id number]

## 2015-09-29 NOTE — Addendum Note (Signed)
Addended byClarene Essex on: 09/29/2015 10:30 AM   Modules accepted: Orders

## 2015-09-29 NOTE — Anesthesia Preprocedure Evaluation (Addendum)
Anesthesia Evaluation  Patient identified by MRN, date of birth, ID band Patient awake    Reviewed: Allergy & Precautions, NPO status , Patient's Chart, lab work & pertinent test results  Airway Mallampati: II  TM Distance: >3 FB Neck ROM: Full   Comment: CT soft tissue neck 07-31-15 reviewed. Dental no notable dental hx.    Pulmonary neg pulmonary ROS,    Pulmonary exam normal breath sounds clear to auscultation       Cardiovascular Exercise Tolerance: Good hypertension, Normal cardiovascular exam Rhythm:Regular Rate:Normal     Neuro/Psych negative neurological ROS  negative psych ROS   GI/Hepatic Neg liver ROS, GERD  Medicated,  Endo/Other  negative endocrine ROS  Renal/GU negative Renal ROS  negative genitourinary   Musculoskeletal negative musculoskeletal ROS (+)   Abdominal   Peds negative pediatric ROS (+)  Hematology negative hematology ROS (+)   Anesthesia Other Findings   Reproductive/Obstetrics negative OB ROS                            Anesthesia Physical Anesthesia Plan  ASA: II  Anesthesia Plan: MAC   Post-op Pain Management:    Induction: Intravenous  Airway Management Planned: Natural Airway  Additional Equipment:   Intra-op Plan:   Post-operative Plan:   Informed Consent: I have reviewed the patients History and Physical, chart, labs and discussed the procedure including the risks, benefits and alternatives for the proposed anesthesia with the patient or authorized representative who has indicated his/her understanding and acceptance.   Dental advisory given  Plan Discussed with: CRNA  Anesthesia Plan Comments:         Anesthesia Quick Evaluation

## 2015-09-29 NOTE — Discharge Instructions (Signed)
Esophagogastroduodenoscopy, Care After Refer to this sheet in the next few weeks. These instructions provide you with information about caring for yourself after your procedure. Your health care provider [place] also give you more specific instructions. Your treatment has been planned according to current medical practices, but problems sometimes occur. Call your health care provider if you have any problems or questions after your procedure. WHAT TO EXPECT AFTER THE PROCEDURE After your procedure, it is typical to feel:  Soreness in your throat.  Pain with swallowing.  Sick to your stomach (nauseous).  Bloated.  Dizzy.  Fatigued. HOME CARE INSTRUCTIONS  Do not eat or drink anything until the numbing medicine (local anesthetic) has worn off and your gag reflex has returned. You will know that the local anesthetic has worn off when you can swallow comfortably.  Do not drive or operate machinery until directed by your health care provider.  Take medicines only as directed by your health care provider. SEEK MEDICAL CARE IF:   You cannot stop coughing.  You are not urinating at all or less than usual. SEEK IMMEDIATE MEDICAL CARE IF:  You have difficulty swallowing.  You cannot eat or drink.  You have worsening throat or chest pain.  You have dizziness or lightheadedness or you faint.  You have nausea or vomiting.  You have chills.  You have a fever.  You have severe abdominal pain.  You have black, tarry, or bloody stools.   This information is not intended to replace advice given to you by your health care provider. Make sure you discuss any questions you have with your health care provider.   Document Released: 08/29/2012 Document Revised: 10/03/2014 Document Reviewed: 08/29/2012 Elsevier Interactive Patient Education [employer]. Call if question or problem otherwise call in 1 week for biopsy results and follow-up in the office as needed and no aspirin or  nonsteroidals for 2 weeks and Tylenol okay and only for now

## 2015-09-29 NOTE — Anesthesia Postprocedure Evaluation (Signed)
Anesthesia Post Note  Patient: Erika [name]  Procedure(s) Performed: Procedure(s) (LRB): ESOPHAGOGASTRODUODENOSCOPY (EGD) WITH PROPOFOL (N/A) HOT HEMOSTASIS (ARGON PLASMA COAGULATION/BICAP) (N/A)  Patient location during evaluation: PACU Anesthesia Type: MAC Level of consciousness: awake and alert Pain management: pain level controlled Vital Signs Assessment: post-procedure vital signs reviewed and stable Respiratory status: spontaneous breathing, nonlabored ventilation, respiratory function stable and patient connected to nasal cannula oxygen Cardiovascular status: stable and blood pressure returned to baseline Anesthetic complications: no    Last Vitals:  Filed Vitals:   09/29/15 1400 09/29/15 1410  BP: 147/100 164/71  Pulse: 65 70  Temp:    Resp: 13 19    Last Pain: There were no vitals filed for this visit.               [name] J

## 2015-09-29 NOTE — Progress Notes (Signed)
Erika [name] 1:04 PM  Subjective: Patient feeling better now that she is off her nonsteroidals and no new complaints  Objective: Vital signs stable afebrile no acute distress exam please see preassessment evaluation  Assessment: Resolved upper tract symptoms off nonsteroidals inpatient due for endoscopic screening for her history of duodenal polyp  Plan: Okay to proceed with endoscopy with anesthesia assistance  [facility]  Pager [phone] After 5PM or if no answer call [phone]

## 2015-09-29 NOTE — Transfer of Care (Signed)
Immediate Anesthesia Transfer of Care Note  Patient: Lonie Peak Tsao  Procedure(s) Performed: Procedure(s): ESOPHAGOGASTRODUODENOSCOPY (EGD) WITH PROPOFOL (N/A) HOT HEMOSTASIS (ARGON PLASMA COAGULATION/BICAP) (N/A)  Patient Location: ENDO  Anesthesia Type:MAC  Level of Consciousness:  sedated, patient cooperative and responds to stimulation  Airway & Oxygen Therapy:Patient Spontanous Breathing and Patient connected to face mask oxgen  Post-op Assessment:  Report given to ENDO RN and Post -op Vital signs reviewed and stable  Post vital signs:  Reviewed and stable  Last Vitals:  Filed Vitals:   09/29/15 1210  BP: 149/66  Pulse: 70  Temp: 36.8 C  Resp: 18    Complications: No apparent anesthesia complications

## 2015-09-30 ENCOUNTER — Encounter (HOSPITAL_COMMUNITY): Payer: Self-pay | Admitting: Gastroenterology

## 2015-12-17 ENCOUNTER — Other Ambulatory Visit: Payer: Self-pay | Admitting: Gastroenterology

## 2015-12-17 DIAGNOSIS — R1084 Generalized abdominal pain: Secondary | ICD-10-CM

## 2015-12-23 ENCOUNTER — Ambulatory Visit
Admission: RE | Admit: 2015-12-23 | Discharge: 2015-12-23 | Disposition: A | Discharge status: Home / Self Care | Payer: Medicare Other | Source: Ambulatory Visit | Attending: Gastroenterology | Admitting: Gastroenterology

## 2015-12-23 DIAGNOSIS — R1084 Generalized abdominal pain: Secondary | ICD-10-CM

## 2016-02-16 ENCOUNTER — Ambulatory Visit
Admission: RE | Admit: 2016-02-16 | Discharge: 2016-02-16 | Disposition: A | Discharge status: Home / Self Care | Payer: Medicare Other | Source: Ambulatory Visit | Attending: Family Medicine | Admitting: Family Medicine

## 2016-02-16 ENCOUNTER — Other Ambulatory Visit: Payer: Self-pay | Admitting: Family Medicine

## 2016-02-16 DIAGNOSIS — K37 Unspecified appendicitis: Secondary | ICD-10-CM

## 2016-02-16 MED ORDER — IOPAMIDOL (ISOVUE-300) INJECTION 61%
100.0000 mL | Freq: Once | INTRAVENOUS | Status: AC | PRN
  Administered 2016-02-16: 100 mL via INTRAVENOUS

## 2016-05-02 ENCOUNTER — Other Ambulatory Visit: Payer: Self-pay | Admitting: Gastroenterology

## 2016-05-02 DIAGNOSIS — R1310 Dysphagia, unspecified: Secondary | ICD-10-CM

## 2016-05-05 ENCOUNTER — Ambulatory Visit
Admission: RE | Admit: 2016-05-05 | Discharge: 2016-05-05 | Disposition: A | Discharge status: Home / Self Care | Payer: Medicare Other | Source: Ambulatory Visit | Attending: Gastroenterology | Admitting: Gastroenterology

## 2016-05-05 DIAGNOSIS — R1310 Dysphagia, unspecified: Secondary | ICD-10-CM

## 2016-07-11 ENCOUNTER — Other Ambulatory Visit: Payer: Self-pay | Admitting: Family Medicine

## 2016-07-11 DIAGNOSIS — R109 Unspecified abdominal pain: Secondary | ICD-10-CM

## 2016-07-11 DIAGNOSIS — R102 Pelvic and perineal pain: Secondary | ICD-10-CM

## 2016-07-12 ENCOUNTER — Other Ambulatory Visit: Payer: Self-pay | Admitting: Family Medicine

## 2016-07-12 DIAGNOSIS — R109 Unspecified abdominal pain: Secondary | ICD-10-CM

## 2016-07-14 ENCOUNTER — Other Ambulatory Visit: Payer: Medicare Other

## 2016-07-18 ENCOUNTER — Ambulatory Visit
Admission: RE | Admit: 2016-07-18 | Discharge: 2016-07-18 | Disposition: A | Discharge status: Home / Self Care | Payer: Medicare Other | Source: Ambulatory Visit | Attending: Family Medicine | Admitting: Family Medicine

## 2016-07-18 DIAGNOSIS — R109 Unspecified abdominal pain: Secondary | ICD-10-CM

## 2016-08-31 ENCOUNTER — Other Ambulatory Visit: Payer: Self-pay | Admitting: Gastroenterology

## 2016-08-31 DIAGNOSIS — R109 Unspecified abdominal pain: Secondary | ICD-10-CM

## 2016-09-06 ENCOUNTER — Ambulatory Visit
Admission: RE | Admit: 2016-09-06 | Discharge: 2016-09-06 | Disposition: A | Discharge status: Home / Self Care | Payer: Medicare Other | Source: Ambulatory Visit | Attending: Gastroenterology | Admitting: Gastroenterology

## 2016-09-06 DIAGNOSIS — R109 Unspecified abdominal pain: Secondary | ICD-10-CM

## 2016-12-26 DIAGNOSIS — Z0000 Encounter for general adult medical examination without abnormal findings: Secondary | ICD-10-CM | POA: Diagnosis not present

## 2016-12-26 DIAGNOSIS — K589 Irritable bowel syndrome without diarrhea: Secondary | ICD-10-CM | POA: Diagnosis not present

## 2016-12-26 DIAGNOSIS — K219 Gastro-esophageal reflux disease without esophagitis: Secondary | ICD-10-CM | POA: Diagnosis not present

## 2017-04-13 ENCOUNTER — Other Ambulatory Visit (HOSPITAL_COMMUNITY): Payer: Self-pay | Admitting: Gastroenterology

## 2017-04-13 DIAGNOSIS — K210 Gastro-esophageal reflux disease with esophagitis: Secondary | ICD-10-CM | POA: Diagnosis not present

## 2017-04-13 DIAGNOSIS — R109 Unspecified abdominal pain: Secondary | ICD-10-CM | POA: Diagnosis not present

## 2017-04-13 DIAGNOSIS — R112 Nausea with vomiting, unspecified: Secondary | ICD-10-CM

## 2017-04-13 DIAGNOSIS — R1084 Generalized abdominal pain: Secondary | ICD-10-CM

## 2017-04-21 ENCOUNTER — Ambulatory Visit (HOSPITAL_COMMUNITY)
Admission: RE | Admit: 2017-04-21 | Discharge: 2017-04-21 | Disposition: A | Discharge status: Home / Self Care | Payer: Medicare Other | Source: Ambulatory Visit | Attending: Gastroenterology | Admitting: Gastroenterology

## 2017-04-21 DIAGNOSIS — R109 Unspecified abdominal pain: Secondary | ICD-10-CM | POA: Insufficient documentation

## 2017-04-21 DIAGNOSIS — R112 Nausea with vomiting, unspecified: Secondary | ICD-10-CM | POA: Insufficient documentation

## 2017-04-21 DIAGNOSIS — R634 Abnormal weight loss: Secondary | ICD-10-CM | POA: Diagnosis not present

## 2017-04-21 DIAGNOSIS — R1084 Generalized abdominal pain: Secondary | ICD-10-CM | POA: Diagnosis not present

## 2017-04-21 MED ORDER — TECHNETIUM TC 99M MEBROFENIN IV KIT
5.0000 | PACK | Freq: Once | INTRAVENOUS | Status: AC | PRN
  Administered 2017-04-21: 5 via INTRAVENOUS

## 2017-05-17 DIAGNOSIS — N3946 Mixed incontinence: Secondary | ICD-10-CM | POA: Diagnosis not present

## 2017-05-17 DIAGNOSIS — Z79899 Other long term (current) drug therapy: Secondary | ICD-10-CM | POA: Diagnosis not present

## 2017-05-17 DIAGNOSIS — Z0000 Encounter for general adult medical examination without abnormal findings: Secondary | ICD-10-CM | POA: Diagnosis not present

## 2017-05-17 DIAGNOSIS — G3184 Mild cognitive impairment, so stated: Secondary | ICD-10-CM | POA: Diagnosis not present

## 2017-05-17 DIAGNOSIS — R300 Dysuria: Secondary | ICD-10-CM | POA: Diagnosis not present

## 2017-06-01 ENCOUNTER — Ambulatory Visit: Payer: Medicare Other | Admitting: Gynecology

## 2017-06-01 DIAGNOSIS — H6123 Impacted cerumen, bilateral: Secondary | ICD-10-CM | POA: Diagnosis not present

## 2017-06-01 DIAGNOSIS — R1084 Generalized abdominal pain: Secondary | ICD-10-CM | POA: Diagnosis not present

## 2017-06-26 DIAGNOSIS — M810 Age-related osteoporosis without current pathological fracture: Secondary | ICD-10-CM

## 2017-06-26 HISTORY — DX: Age-related osteoporosis without current pathological fracture: M81.0

## 2017-07-06 ENCOUNTER — Encounter: Payer: Self-pay | Admitting: Gynecology

## 2017-07-06 ENCOUNTER — Ambulatory Visit (INDEPENDENT_AMBULATORY_CARE_PROVIDER_SITE_OTHER): Payer: Medicare Other | Admitting: Gynecology

## 2017-07-06 VITALS — BP 122/78 | Ht 63.5 in | Wt 126.0 lb

## 2017-07-06 DIAGNOSIS — Z01411 Encounter for gynecological examination (general) (routine) with abnormal findings: Secondary | ICD-10-CM | POA: Diagnosis not present

## 2017-07-06 DIAGNOSIS — N952 Postmenopausal atrophic vaginitis: Secondary | ICD-10-CM

## 2017-07-06 DIAGNOSIS — R350 Frequency of micturition: Secondary | ICD-10-CM

## 2017-07-06 NOTE — Progress Notes (Signed)
    Erika [name] [birth year] [id number]        76 y.o.  G1P1 for annual gynecologic exam.  Has not been the office for a number of years. Initially made her appointment because she was having some vague suprapubic discomfort but this is resolved. She was having no nausea vomiting diarrhea constipation. No frequency dysuria or urgency low back pain fever or chills. Patient described is more pressure symptoms that lasted for a week or so but now is resolved.  Past medical history,surgical history, problem list, medications, allergies, family history and social history were all reviewed and documented as reviewed in the EPIC chart.  ROS:  Performed with pertinent positives and negatives included in the history, assessment and plan.   Additional significant findings :  None   Exam: [name] assistant Vitals:   07/06/17 1135  BP: 122/78  Weight: 126 lb (57.2 kg)  Height: 5' 3.5" (1.613 m)   Body mass index is 21.97 kg/m.  General appearance:  Normal affect, orientation and appearance. Skin: Grossly normal HEENT: Without gross lesions.  No cervical or supraclavicular adenopathy. Thyroid normal.  Lungs:  Clear without wheezing, rales or rhonchi Cardiac: RR, without RMG Abdominal:  Soft, nontender, without masses, guarding, rebound, organomegaly or hernia Breasts:  Examined lying and sitting without masses, retractions, discharge or axillary adenopathy. Pelvic:  Ext, BUS, Vagina: With atrophic changes  Cervix: With atrophic changes  Uterus: Anteverted, normal size, shape and contour, midline and mobile nontender   Adnexa: Without masses or tenderness    Anus and perineum: Normal   Rectovaginal: Normal sphincter tone without palpated masses or tenderness.    Assessment/Plan:  76 y.o. G1P1 female for annual gynecologic exam.   1. History of vague suprapubic discomfort. Transient now resolved. Exam is normal area no associated symptoms. Recommend observe for now and follow up if  recurs. 2.  Postmenopausal/atrophic genital changes. No significant hot flushes, night sweats, vaginal dryness or any vaginal bleeding. Continue to monitor report any issues. 3. Mammography 2016. Reminded patient she is overdue and she agrees to call and schedule. Breast exam normal today. 4. Pap smear a number of years ago. No history of abnormal Pap smears previously. No Pap smear done today. Reviewed current screening guidelines and we are both comfortable with stop screening based on age. 5. Colonoscopy 4 years ago. Repeat at their recommended interval. 6. DEXA 2013. Recommend follow up DEXA now and patient agrees to schedule. Increase calcium vitamin D. 7. Health maintenance. No routine lab work done as this is done through her primary physician's office. Follow up 1 year, sooner as needed.   Anastasio Auerbach MD, 12:00 PM 07/06/2017

## 2017-07-06 NOTE — Patient Instructions (Signed)
Follow up for bone density as scheduled  Schedule your mammogram

## 2017-07-06 NOTE — Addendum Note (Signed)
Addended by: Joaquin Music on: 07/06/2017 12:34 PM   Modules accepted: Orders

## 2017-07-25 ENCOUNTER — Ambulatory Visit (INDEPENDENT_AMBULATORY_CARE_PROVIDER_SITE_OTHER): Payer: Medicare Other

## 2017-07-25 ENCOUNTER — Telehealth: Payer: Self-pay | Admitting: Gynecology

## 2017-07-25 ENCOUNTER — Encounter: Payer: Self-pay | Admitting: Gynecology

## 2017-07-25 DIAGNOSIS — Z23 Encounter for immunization: Secondary | ICD-10-CM | POA: Diagnosis not present

## 2017-07-25 DIAGNOSIS — M810 Age-related osteoporosis without current pathological fracture: Secondary | ICD-10-CM

## 2017-07-25 DIAGNOSIS — Z01411 Encounter for gynecological examination (general) (routine) with abnormal findings: Secondary | ICD-10-CM

## 2017-07-25 NOTE — Telephone Encounter (Signed)
Tell patient her bone density shows osteoporosis.  Recommend office visit to discuss treatment options. 

## 2017-07-26 ENCOUNTER — Other Ambulatory Visit: Payer: Self-pay | Admitting: Gynecology

## 2017-07-26 DIAGNOSIS — M810 Age-related osteoporosis without current pathological fracture: Secondary | ICD-10-CM

## 2017-07-26 NOTE — Telephone Encounter (Signed)
Left detailed message on voicemail per DPR access.  

## 2017-08-31 ENCOUNTER — Ambulatory Visit: Payer: Medicare Other | Admitting: Gynecology

## 2017-10-23 ENCOUNTER — Ambulatory Visit (INDEPENDENT_AMBULATORY_CARE_PROVIDER_SITE_OTHER): Payer: Medicare Other | Admitting: Gynecology

## 2017-10-23 ENCOUNTER — Encounter: Payer: Self-pay | Admitting: Gynecology

## 2017-10-23 VITALS — BP 120/76

## 2017-10-23 DIAGNOSIS — M810 Age-related osteoporosis without current pathological fracture: Secondary | ICD-10-CM | POA: Diagnosis not present

## 2017-10-23 MED ORDER — ALENDRONATE SODIUM 70 MG PO TABS
70.0000 mg | ORAL_TABLET | ORAL | 4 refills | Status: DC
Start: 2017-10-23 — End: 2020-07-27

## 2017-10-23 NOTE — Progress Notes (Signed)
    Erika [name] [birth year] [id number]        77 y.o.  G1P1 presents to discuss her most recent bone density showing osteoporosis.  All measurements were osteoporotic with greatest T score -2.8.  She had a significant loss since her prior bone density.  Past medical history,surgical history, problem list, medications, allergies, family history and social history were all reviewed and documented in the EPIC chart.  Directed ROS with pertinent positives and negatives documented in the history of present illness/assessment and plan.  Exam: Vitals:   10/23/17 1119  BP: 120/76   General appearance:  Normal   Assessment/Plan:  77 y.o. G1P1 with osteoporosis on most recent bone density.  No history of fractures.  I reviewed the pathophysiology of osteoporosis and risks to include fracture and the sequela I of fracture particularly hip fracture and spinal fractures.  With osteoporosis measured at all sites she is at an increased risk for fracture.  I reviewed her bone density report with her.  We discussed treatment options to include bisphosphate's and Prolia.  This point do not feel teriparatide or Evista need to be considered.  She does have a history of hiatal hernia and the possible GERD side effects/risks with oral bisphosphonates discussed.  We also reviewed osteonecrosis of the jaw and atypical fractures particularly with prolonged use.  After lengthy discussion she will start on alendronate 70 mg weekly.  She will alert me if she has any side effects and we will consider Prolia at that point if needed.  Rashes and infections discussed also.  Assuming she does well with this then we will plan on repeating her bone density in 2 years and a course of treatment for 5-6 years with a subsequent drug-free holiday.  I also recommended baseline vitamin D level today and she will go ahead and have that drawn.  She does take extra vitamin D and will make sure she is in the therapeutic range.  Patient's  questions were all answered and she is comfortable with starting the alendronate.  Greater than 50% of my time was spent in direct face to face counseling and coordination of care with the patient.     Anastasio Auerbach MD, 11:55 AM 10/23/2017

## 2017-10-23 NOTE — Patient Instructions (Signed)
Start on the alendronate as we discussed.  Call if you have any issues with this.    Alendronate; Cholecalciferol tablets What is this medicine? ALENDRONATE; CHOLECALCIFEROL (a LEN droe nate; KOL e cal SIF er ol) has two medicines to help reduce calcium loss from bones and to increase the production of normal, healthy bone in patients with osteoporosis. This medicine Thackston be used for other purposes; ask your health care provider or pharmacist if you have questions. COMMON BRAND NAME(S): Fosamax Plus D What should I tell my health care provider before I take this medicine? They need to know if you have any of these conditions: - dental disease -kidney disease -low level or high level of blood calcium -problems sitting or standing for 30 minutes -problems swallowing -stomach, intestine, or esophagus problems like acid reflux or GERD -vitamin D toxicity -an unusual or allergic reaction to alendronate, cholecalciferol, medicines, foods, lactose, dyes, or preservatives -pregnant or trying to get pregnant -breast-feeding How should I use this medicine? You must take this medicine exactly as directed or you will lower the amount of medicine you absorb into your body or you Livesey cause yourself harm. Take your dose by mouth first thing in the morning, after you are up for the day. Do not eat or drink anything before you take this medicine. Swallow your medicine with a full glass (6 to 8 fluid ounces) of plain water. Do not take this tablet with any with any other drink. Follow the directions on the prescription label. After taking this medicine, do not eat breakfast, drink, or take any medicines or vitamins for at least 30 minutes. Stand or sit up for at least 30 minutes after you take this medicine; do not lie down. Take the medicine on the same day every week. Do not take your medicine more often than directed. Do not stop taking except on your doctor's advice. Talk to your pediatrician regarding the  use of this medicine in children. Special care Montoro be needed. Overdosage: If you think you have taken too much of this medicine contact a poison control center or emergency room at once. NOTE: This medicine is only for you. Do not share this medicine with others. What if I miss a dose? If you miss a dose, take the dose on the morning after you remember. Take your next dose on your regular chosen day of the week, but do not take 2 tablets on the same day. Do not take double or extra doses. What Longmire interact with this medicine? -antacids -aspirin and aspirin like drugs -calcium supplements, especially calcium with vitamin D -cholestyramine or colestipol -cimetidine, ranitidine, or other medicines used to decrease stomach acid -corticosteroids -iron supplements -magnesium supplements -mineral oil -NSAIDs, medicines for pain and inflammation, like ibuprofen or naproxen -orlistat -phenobarbital -phenytoin -phosphorous supplements -primidone -steroid medicines like prednisone or cortisone -thiazide diuretics -vitamins with minerals, especially with vitamin D This list Whittingham not describe all possible interactions. Give your health care provider a list of all the medicines, herbs, non-prescription drugs, or dietary supplements you use. Also tell them if you smoke, drink alcohol, or use illegal drugs. Some items Strozier interact with your medicine. What should I watch for while using this medicine? Visit your doctor or health care professional for regular checkups. It Greeley be some time before you see the benefit from this medicine. Do not stop taking your medicine unless your doctor tells you to. Your doctor Zimny order blood tests or other tests to see how  you are doing. You should make sure that you get enough calcium and vitamin D while you are taking this medicine. Discuss the foods you eat and the vitamins you take with your health care professional. If you have pain when swallowing, difficulty  swallowing, heartburn, or stomach pain, immediately call your doctor or health care professional. If you are taking an antacid, a mineral supplement like calcium or iron, or a vitamin with minerals, wait to take them at least 30 minutes after you take this medicine. Do not take them at same time. This medicine can make you more sensitive to the sun. If you get a rash while taking this medicine, sunlight Dost cause the rash to get worse. Keep out of the sun. If you cannot avoid being in the sun, wear protective clothing and use sunscreen. Do not use sun lamps or tanning beds/booths. Some people who take this medicine have severe bone, joint, and/or muscle pain. This medicine Saville also increase your risk for a broken thigh bone. Tell your doctor right away if you have pain in your upper leg or groin. Tell your doctor if you have any pain that does not go away or that gets worse. What side effects Sigman I notice from receiving this medicine? Side effects that you should report to your doctor or health care professional as soon as possible: -allergic reactions like skin rash, itching or hives, swelling of the face, lips, or tongue -bone, muscle, or joint pain -changes in vision -heartburn or chest pain -pain or difficulty swallowing -redness, blistering, peeling or loosening of the skin, including inside the mouth -stomach pain -unusual bleeding or bruising -unusually weak or tired -vomiting Side effects that usually do not require medical attention (report to your doctor or health care professional if they continue or are bothersome): -diarrhea or constipation -headache -nausea -stomach gas or fullness This list Alejandro not describe all possible side effects. Call your doctor for medical advice about side effects. You Nathanson report side effects to FDA at 1-800-FDA-1088. Where should I keep my medicine? Keep out of the reach of children. Store at room temperature between 20 and 25 degrees C (68 and 77  degrees F). Protect the medicine from moisture and light. Throw away any unused medicine after the expiration date. NOTE: This sheet is a summary. It Byus not cover all possible information. If you have questions about this medicine, talk to your doctor, pharmacist, or health care provider.  2018 Elsevier/Gold Standard (2011-03-11 08:57:31)

## 2017-10-24 LAB — VITAMIN D 25 HYDROXY (VIT D DEFICIENCY, FRACTURES): VIT D 25 HYDROXY: 53 ng/mL (ref 30–100)

## 2018-01-19 DIAGNOSIS — N393 Stress incontinence (female) (male): Secondary | ICD-10-CM | POA: Diagnosis not present

## 2018-01-19 DIAGNOSIS — K219 Gastro-esophageal reflux disease without esophagitis: Secondary | ICD-10-CM | POA: Diagnosis not present

## 2018-01-19 DIAGNOSIS — I10 Essential (primary) hypertension: Secondary | ICD-10-CM | POA: Diagnosis not present

## 2018-01-19 DIAGNOSIS — M810 Age-related osteoporosis without current pathological fracture: Secondary | ICD-10-CM | POA: Diagnosis not present

## 2018-01-19 DIAGNOSIS — K589 Irritable bowel syndrome without diarrhea: Secondary | ICD-10-CM | POA: Diagnosis not present

## 2018-01-19 DIAGNOSIS — R4189 Other symptoms and signs involving cognitive functions and awareness: Secondary | ICD-10-CM | POA: Diagnosis not present

## 2018-01-19 DIAGNOSIS — D510 Vitamin B12 deficiency anemia due to intrinsic factor deficiency: Secondary | ICD-10-CM | POA: Diagnosis not present

## 2018-01-19 DIAGNOSIS — K449 Diaphragmatic hernia without obstruction or gangrene: Secondary | ICD-10-CM | POA: Diagnosis not present

## 2018-02-06 ENCOUNTER — Telehealth: Payer: Self-pay | Admitting: Internal Medicine

## 2018-02-06 NOTE — Telephone Encounter (Signed)
Received referral to schedule an Office Visit for GERD. Patient says that her husband is a Dr. Henrene Pastor patient and is requesting to transfer to him. Records placed on Dr. Blanch Media desk for review.

## 2018-02-12 NOTE — Telephone Encounter (Signed)
Patient calling in regarding this.  °

## 2018-02-14 ENCOUNTER — Encounter: Payer: Self-pay | Admitting: Internal Medicine

## 2018-02-14 NOTE — Telephone Encounter (Signed)
Dr. Henrene Pastor reviewed records and has accepted patient. OV scheduled.

## 2018-04-17 ENCOUNTER — Encounter (INDEPENDENT_AMBULATORY_CARE_PROVIDER_SITE_OTHER): Payer: Self-pay

## 2018-04-17 ENCOUNTER — Ambulatory Visit (INDEPENDENT_AMBULATORY_CARE_PROVIDER_SITE_OTHER): Payer: Medicare Other | Admitting: Internal Medicine

## 2018-04-17 ENCOUNTER — Other Ambulatory Visit (INDEPENDENT_AMBULATORY_CARE_PROVIDER_SITE_OTHER): Payer: Medicare Other

## 2018-04-17 ENCOUNTER — Encounter: Payer: Self-pay | Admitting: Internal Medicine

## 2018-04-17 VITALS — BP 130/82 | HR 72 | Ht 63.0 in | Wt 124.1 lb

## 2018-04-17 DIAGNOSIS — Z860100 Personal history of colon polyps, unspecified: Secondary | ICD-10-CM

## 2018-04-17 DIAGNOSIS — K449 Diaphragmatic hernia without obstruction or gangrene: Secondary | ICD-10-CM | POA: Diagnosis not present

## 2018-04-17 DIAGNOSIS — R109 Unspecified abdominal pain: Secondary | ICD-10-CM

## 2018-04-17 DIAGNOSIS — R112 Nausea with vomiting, unspecified: Secondary | ICD-10-CM

## 2018-04-17 LAB — BASIC METABOLIC PANEL
BUN: 16 mg/dL (ref 6–23)
CO2: 27 mEq/L (ref 19–32)
Calcium: 9.6 mg/dL (ref 8.4–10.5)
Chloride: 101 mEq/L (ref 96–112)
Creatinine, Ser: 0.99 mg/dL (ref 0.40–1.20)
GFR: 57.56 mL/min — AB (ref >60.00)
Glucose, Bld: 93 mg/dL (ref 70–99)
Potassium: 4.1 mEq/L (ref 3.5–5.1)
Sodium: 137 mEq/L (ref 135–145)

## 2018-04-17 MED ORDER — NA SULFATE-K SULFATE-MG SULF 17.5-3.13-1.6 GM/177ML PO SOLN: 1.0000 | Freq: Once | ORAL | 0 refills | Status: AC

## 2018-04-17 NOTE — Patient Instructions (Signed)
Your provider has requested that you go to the basement level for lab work before leaving today. Press "B" on the elevator. The lab is located at the first door on the left as you exit the elevator.  You have been scheduled for a CT scan of the abdomen and pelvis at [facility] ([address]---this is in the same building as Press photographer).   You are scheduled on 04/24/2018 at  3:30pm. You should arrive 15 minutes prior to your appointment time for registration. Please follow the written instructions below on the day of your exam:  WARNING: IF YOU ARE ALLERGIC TO IODINE/X-RAY DYE, PLEASE NOTIFY RADIOLOGY IMMEDIATELY AT [phone]! YOU WILL BE GIVEN A 13 HOUR PREMEDICATION PREP.  1) Do not eat anything after 11:30am (4 hours prior to your test) 2) You have been given 2 bottles of oral contrast to drink. The solution Yeary taste               better if refrigerated, but do NOT add ice or any other liquid to this solution. Shake  well before drinking.    Drink 1 bottle of contrast @ 1:30pm (2 hours prior to your exam)  Drink 1 bottle of contrast @ 2:30pm (1 hour prior to your exam)  You Fawaz take any medications as prescribed with a small amount of water except for the following: Metformin, Glucophage, Glucovance, Avandamet, Riomet, Fortamet, Actoplus Met, Janumet, Glumetza or Metaglip. The above medications must be held the day of the exam AND 48 hours after the exam.  The purpose of you drinking the oral contrast is to aid in the visualization of your intestinal tract. The contrast solution Preziosi cause some diarrhea. Before your exam is started, you will be given a small amount of fluid to drink. Depending on your individual set of symptoms, you Hohman also receive an intravenous injection of x-ray contrast/dye. Plan on being at [facility] for 30 minutes or long, depending on the type of exam you are having performed.  If you have any questions regarding your exam or if you  need to reschedule, you Girgenti call the CT department at [phone] between the hours of 8:00 am and 5:00 pm, Monday-Friday.  .You have been scheduled for an endoscopy and colonoscopy. Please follow the written instructions given to you at your visit today. Please pick up your prep supplies at the pharmacy within the next 1-3 days. If you use inhalers (even only as needed), please bring them with you on the day of your procedure. Your physician has requested that you go to www.startemmi.com and enter the access code given to you at your visit today. This web site gives a general overview about your procedure. However, you should still follow specific instructions given to you by our office regarding your preparation for the procedure.   ________________________________________________________________________

## 2018-04-17 NOTE — Progress Notes (Signed)
HISTORY OF PRESENT ILLNESS:  Erika [name] is a pleasant 77 y.o. female , retired from [employer] and wife of Erika [name] past medical history as listed below who presents today to establish care with this office regarding problems with recurrent abdominal pain, nausea, and vomiting. Previous patient of [facility], Dr. Watt Climes. Patient has extensive outside records which I have reviewed. 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 The patient's husband, Erika [name], is a patient of mine and requested that she be seen here for a second opinion. Patient tells me that her problems began in the second half of 2017. Approximately once per month, often when out eating, she develops epigastric pain followed by what sounds like vomiting or regurgitation of mucoid material but not food. This is followed by 3-4 hours of severe generalized abdominal pain which eventually abates. Thereafter she feels fine. She tells me that she has had 20 pound weight loss over the past 2 years. Her last episode was 1 month ago. Review of outside records shows colonoscopy from 2012 with adenomatous polyps removed. Last upper endoscopy 2017 with hyperplastic gastric polyp and duodenal adenoma (second portion). Follow-up in 2-3 years recommended. The patient has no history of abdominal surgery multiple imaging studies have been performed to work up her recurrent problems with pain and vomiting. Abdominal ultrasound March 2017 was normal. Upper GI series performed August 2017 revealed large hiatal hernia with 30% of the stomach in her chest. Normal hepatobiliary scan 2018, July. CT scan December 2017 with large hiatal hernia and colonic diverticulosis. No acute findings. Blood work from 2017 with unremarkable CBC and comprehensive metabolic panel. Her bowel habits are regular. She does take omeprazole 40 mg daily with no  classic reflux symptoms.  REVIEW OF SYSTEMS:  All non-GI ROS negative unless otherwise stated in the history of present illness except for act pain, muscle cramps, urinary leakage  Past Medical History:  Diagnosis Date  . Arthritis   . Colon polyps   . GERD (gastroesophageal reflux disease)   . Hiatal hernia   . Hypercholesterolemia   . Hypertension    NO MEDICINE  . Irritable bowel syndrome    off and on abdominal pain -past 2 months  . Neck pain    right sided neck pain  . Osteoporosis 06/2017   T score -2.8  . Shoulder pain    09-17-15- no shoulder pain at present  . Sleep apnea     Past Surgical History:  Procedure Laterality Date  . CATARACT EXTRACTION, BILATERAL Bilateral   . COLONOSCOPY    . cortisone shots in back    . ESOPHAGOGASTRODUODENOSCOPY    . ESOPHAGOGASTRODUODENOSCOPY N/A 08/14/2013   Procedure: ESOPHAGOGASTRODUODENOSCOPY (EGD);  Surgeon: Jeryl Columbia, MD;  Location: [facility];  Service: Endoscopy;  Laterality: N/A;  . ESOPHAGOGASTRODUODENOSCOPY (EGD) WITH PROPOFOL N/A 09/29/2015   Procedure: ESOPHAGOGASTRODUODENOSCOPY (EGD) WITH PROPOFOL;  Surgeon: Clarene Essex, MD;  Location: [facility];  Service: Endoscopy;  Laterality: N/A;  . FOOT SURGERY     BILATERAL  . HOT HEMOSTASIS N/A 08/14/2013   Procedure: HOT HEMOSTASIS (ARGON PLASMA COAGULATION/BICAP);  Surgeon: Jeryl Columbia, MD;  Location: [facility];  Service: Endoscopy;  Laterality: N/A;  . HOT HEMOSTASIS N/A 09/29/2015   Procedure: HOT HEMOSTASIS (ARGON PLASMA COAGULATION/BICAP);  Surgeon: Clarene Essex, MD;  Location: [facility];  Service: Endoscopy;  Laterality: N/A;  . TUBAL LIGATION  Social History Erika [name]  reports that she has never smoked. She has never used smokeless tobacco. She reports that she does not drink alcohol or use drugs.  family history includes Heart disease in her father, paternal uncle, paternal uncle, paternal uncle, and paternal uncle; Kidney failure in her  mother.  No Known Allergies     PHYSICAL EXAMINATION: Vital signs: BP 130/82   Pulse 72   Ht 5\' 3"  (1.6 m)   Wt 124 lb 2 oz (56.3 kg)   BMI 21.99 kg/m   Constitutional: pleasant,generally well-appearing, no acute distress Psychiatric: alert and oriented x3, cooperative Eyes: extraocular movements intact, anicteric, conjunctiva pink Mouth: oral pharynx moist, no lesions Neck: supple no lymphadenopathy Cardiovascular: heart regular rate and rhythm, no murmur Lungs: clear to auscultation bilaterally Abdomen: soft, nontender, nondistended, no obvious ascites, no peritoneal signs, normal bowel sounds, no organomegaly Rectal:deferred until colonoscopy Extremities: no clubbing, cyanosis, or lower extremity edema bilaterally Skin: no lesions on visible extremities Neuro: No focal deficits. Cranial nerves intact  ASSESSMENT:  #1. Problems with severe abdominal pain and regurgitation/vomiting as described. I'm concerned that she has intermittent obstructive symptoms from her large hiatal hernia as a cause #2. GERD. No classic symptoms on PPI #3. History of duodenal adenoma requiring follow-up. Last exam 2017 #4. History of adenomatous colon polyps. Last exam 2012 #5. 20 pound weight loss over several years   PLAN:  #1. Continue PPI #2. Upper endoscopy to evaluate hiatal hernia and surveyed duodenal adenoma.The nature of the procedure, as well as the risks, benefits, and alternatives were carefully and thoroughly reviewed with the patient. Ample time for discussion and questions allowed. The patient understood, was satisfied, and agreed to proceed. #3. Surveillance colonoscopy. Due.The nature of the procedure, as well as the risks, benefits, and alternatives were carefully and thoroughly reviewed with the patient. Ample time for discussion and questions allowed. The patient understood, was satisfied, and agreed to proceed. #4. Contrast-enhanced CT scan of the abdomen and pelvis to  evaluate ongoing pain and weight loss. #5. Further recommendations to follow. Oliva need surgical opinion  60 minutes spent face-to-face with the patient. Greater than 50% a time use for counseling regarding her chronic recurrent problems with pain and vomiting. As well, discussed surveillance of duodenal adenoma, evaluation of hiatal hernia, assessment of weight loss, and colon polyp surveillance

## 2018-04-24 ENCOUNTER — Ambulatory Visit (INDEPENDENT_AMBULATORY_CARE_PROVIDER_SITE_OTHER)
Admission: RE | Admit: 2018-04-24 | Discharge: 2018-04-24 | Disposition: A | Discharge status: Home / Self Care | Payer: Medicare Other | Source: Ambulatory Visit | Attending: Internal Medicine | Admitting: Internal Medicine

## 2018-04-24 DIAGNOSIS — K449 Diaphragmatic hernia without obstruction or gangrene: Secondary | ICD-10-CM | POA: Diagnosis not present

## 2018-04-24 DIAGNOSIS — R112 Nausea with vomiting, unspecified: Secondary | ICD-10-CM

## 2018-04-24 DIAGNOSIS — Z86010 Personal history of colonic polyps: Secondary | ICD-10-CM

## 2018-04-24 DIAGNOSIS — R109 Unspecified abdominal pain: Secondary | ICD-10-CM | POA: Diagnosis not present

## 2018-04-24 MED ORDER — IOPAMIDOL (ISOVUE-300) INJECTION 61%
100.0000 mL | Freq: Once | INTRAVENOUS | Status: AC | PRN
  Administered 2018-04-24: 100 mL via INTRAVENOUS

## 2018-06-07 DIAGNOSIS — Z23 Encounter for immunization: Secondary | ICD-10-CM | POA: Diagnosis not present

## 2018-06-18 ENCOUNTER — Encounter: Payer: Self-pay | Admitting: Internal Medicine

## 2018-06-18 ENCOUNTER — Ambulatory Visit (AMBULATORY_SURGERY_CENTER): Payer: Medicare Other | Admitting: Internal Medicine

## 2018-06-18 VITALS — BP 152/66 | HR 58 | Temp 97.7°F | Resp 8 | Ht 63.0 in | Wt 124.0 lb

## 2018-06-18 DIAGNOSIS — R109 Unspecified abdominal pain: Secondary | ICD-10-CM

## 2018-06-18 DIAGNOSIS — D132 Benign neoplasm of duodenum: Secondary | ICD-10-CM | POA: Diagnosis not present

## 2018-06-18 DIAGNOSIS — K449 Diaphragmatic hernia without obstruction or gangrene: Secondary | ICD-10-CM | POA: Diagnosis not present

## 2018-06-18 DIAGNOSIS — Z86010 Personal history of colonic polyps: Secondary | ICD-10-CM | POA: Diagnosis not present

## 2018-06-18 DIAGNOSIS — I10 Essential (primary) hypertension: Secondary | ICD-10-CM | POA: Diagnosis not present

## 2018-06-18 DIAGNOSIS — D122 Benign neoplasm of ascending colon: Secondary | ICD-10-CM

## 2018-06-18 DIAGNOSIS — K317 Polyp of stomach and duodenum: Secondary | ICD-10-CM | POA: Diagnosis not present

## 2018-06-18 DIAGNOSIS — R112 Nausea with vomiting, unspecified: Secondary | ICD-10-CM | POA: Diagnosis not present

## 2018-06-18 MED ORDER — SODIUM CHLORIDE 0.9 % IV SOLN: 500.0000 mL | Freq: Once | INTRAVENOUS | Status: DC

## 2018-06-18 NOTE — Patient Instructions (Signed)
Thank you for allowing [place] to care for you today!  Await pathology results by mail, 1-2 weeks.  Resume previous diet and medications.  Return to normal activities tomorrow.  Dr Blanch Media office will call regarding scheduling an upper GI series to rule out obstructing hiatal hernia      YOU HAD AN ENDOSCOPIC PROCEDURE TODAY AT [facility]:   Refer to the procedure report that was given to you for any specific questions about what was found during the examination.  If the procedure report does not answer your questions, please call your gastroenterologist to clarify.  If you requested that your care partner not be given the details of your procedure findings, then the procedure report has been included in a sealed envelope for you to review at your convenience later.  YOU SHOULD EXPECT: Some feelings of bloating in the abdomen. Passage of more gas than usual.  Walking can help get rid of the air that was put into your GI tract during the procedure and reduce the bloating. If you had a lower endoscopy (such as a colonoscopy or flexible sigmoidoscopy) you [place] notice spotting of blood in your stool or on the toilet paper. If you underwent a bowel prep for your procedure, you Michelle not have a normal bowel movement for a few days.  Please Note:  You might notice some irritation and congestion in your nose or some drainage.  This is from the oxygen used during your procedure.  There is no need for concern and it should clear up in a day or so.  SYMPTOMS TO REPORT IMMEDIATELY:   Following lower endoscopy (colonoscopy or flexible sigmoidoscopy):  Excessive amounts of blood in the stool  Significant tenderness or worsening of abdominal pains  Swelling of the abdomen that is new, acute  Fever of 100F or higher   Following upper endoscopy (EGD)  Vomiting of blood or coffee ground material  New chest pain or pain under the shoulder blades  Painful or persistently difficult  swallowing  New shortness of breath  Fever of 100F or higher  Black, tarry-looking stools  For urgent or emergent issues, a gastroenterologist can be reached at any hour by calling [phone].   DIET:  We do recommend a small meal at first, but then you [place] proceed to your regular diet.  Drink plenty of fluids but you should avoid alcoholic beverages for 24 hours.  ACTIVITY:  You should plan to take it easy for the rest of today and you should NOT DRIVE or use heavy machinery until tomorrow (because of the sedation medicines used during the test).    FOLLOW UP: Our staff will call the number listed on your records the next business day following your procedure to check on you and address any questions or concerns that you Prasad have regarding the information given to you following your procedure. If we do not reach you, we will leave a message.  However, if you are feeling well and you are not experiencing any problems, there is no need to return our call.  We will assume that you have returned to your regular daily activities without incident.  If any biopsies were taken you will be contacted by phone or by letter within the next 1-3 weeks.  Please call us at [phone] if you have not heard about the biopsies in 3 weeks.    SIGNATURES/CONFIDENTIALITY: You and/or your care partner have signed paperwork which will be entered into your electronic  medical record.  These signatures attest to the fact that that the information above on your After Visit Summary has been reviewed and is understood.  Full responsibility of the confidentiality of this discharge information lies with you and/or your care-partner.

## 2018-06-18 NOTE — Op Note (Signed)
[facility] Patient Name: Erika [name] Procedure Date: 06/18/2018 1:17 PM MRN: [id number] Endoscopist: Docia Chuck. Henrene Pastor , MD Age: 77 Referring MD:  Date of Birth: 09-Dec-1940 Gender: Female Account #: [id number] Procedure:                Upper GI endoscopy, with biopsies Indications:              Epigastric abdominal pain, Nausea with vomiting.                            Problems are episodic, approximately once per month                            with symptoms lasting about 4 hours Medicines:                Monitored Anesthesia Care Procedure:                Pre-Anesthesia Assessment:                           - Prior to the procedure, a History and Physical                            was performed, and patient medications and                            allergies were reviewed. The patient's tolerance of                            previous anesthesia was also reviewed. The risks                            and benefits of the procedure and the sedation                            options and risks were discussed with the patient.                            All questions were answered, and informed consent                            was obtained. Prior Anticoagulants: The patient has                            taken no previous anticoagulant or antiplatelet                            agents. ASA Grade Assessment: II - A patient with                            mild systemic disease. After reviewing the risks                            and benefits, the patient was deemed in  satisfactory condition to undergo the procedure.                           After obtaining informed consent, the endoscope was                            passed under direct vision. Throughout the                            procedure, the patient's blood pressure, pulse, and                            oxygen saturations were monitored continuously. The                            Model  GIF-HQ190 [id number]) scope was introduced                            through the mouth, and advanced to the second part                            of duodenum. The upper GI endoscopy was                            accomplished without difficulty. The patient                            tolerated the procedure well. Scope In: Scope Out: Findings:                 One benign-appearing, intrinsic moderate stenosis                            was found 30 cm from the incisors. This stenosis                            measured 1.5 cm (inner diameter). The stenosis was                            traversed.                           The stomach revealed a large hiatal hernia                            measuring approximately 9 cm.                           The cardia and gastric fundus were normal on                            retroflexion.                           A single 12 mm sessile polyp was found in the  second portion of the duodenum. Biopsies were taken                            with a cold forceps for histology. Complications:            No immediate complications. Estimated Blood Loss:     Estimated blood loss: none. Impression:               - Benign-appearing esophageal stenosis.                           - Large hiatal hernia, question intermittent                            obstruction.                           - A single duodenal polyp. Biopsied. Recommendation:           1. Follow-up pathology. [place] need repeat upper                            endoscopy with polyp excision if adenomatous, which                            I suspect.                           2. Schedule upper GI series with barium tablet."                            Rule out obstructing hiatal hernia"                           3. Follow-up to be determined after the above Hyatt Capobianco N. Henrene Pastor, MD 06/18/2018 2:08:20 PM This report has been signed electronically.

## 2018-06-18 NOTE — Op Note (Signed)
[facility] Patient Name: Erika [name] Procedure Date: 06/18/2018 1:18 PM MRN: [id number] Endoscopist: Docia Chuck. Henrene Pastor , MD Age: 77 Referring MD:  Date of Birth: [birth year] Gender: Female Account #: [id number] Procedure:                Colonoscopy, with cold snare polypectomy x 1 Indications:              High risk colon cancer surveillance: Personal                            history of non-advanced adenoma. Previous                            examination elsewhere 2012 Medicines:                Monitored Anesthesia Care Procedure:                Pre-Anesthesia Assessment:                           - Prior to the procedure, a History and Physical                            was performed, and patient medications and                            allergies were reviewed. The patient's tolerance of                            previous anesthesia was also reviewed. The risks                            and benefits of the procedure and the sedation                            options and risks were discussed with the patient.                            All questions were answered, and informed consent                            was obtained. Prior Anticoagulants: The patient has                            taken no previous anticoagulant or antiplatelet                            agents. ASA Grade Assessment: II - A patient with                            mild systemic disease. After reviewing the risks                            and benefits, the patient was deemed in  satisfactory condition to undergo the procedure.                           After obtaining informed consent, the colonoscope                            was passed under direct vision. Throughout the                            procedure, the patient's blood pressure, pulse, and                            oxygen saturations were monitored continuously. The                            Colonoscope was  introduced through the anus and                            advanced to the the cecum, identified by                            appendiceal orifice and ileocecal valve. The                            ileocecal valve, appendiceal orifice, and rectum                            were photographed. The quality of the bowel                            preparation was excellent. The colonoscopy was                            performed without difficulty. The patient tolerated                            the procedure well. The bowel preparation used was                            SUPREP. Scope In: 1:31:28 PM Scope Out: 1:44:55 PM Scope Withdrawal Time: 0 hours 10 minutes 26 seconds  Total Procedure Duration: 0 hours 13 minutes 27 seconds  Findings:                 A 3 mm polyp was found in the ascending colon. The                            polyp was removed with a cold snare. Resection and                            retrieval were complete.                           Multiple diverticula were found in the sigmoid  colon.                           Internal hemorrhoids were found during retroflexion.                           The exam was otherwise without abnormality on                            direct and retroflexion views. Complications:            No immediate complications. Estimated blood loss:                            None. Estimated Blood Loss:     Estimated blood loss: none. Impression:               - One 3 mm polyp in the ascending colon, removed                            with a cold snare. Resected and retrieved.                           - Diverticulosis in the sigmoid colon.                           - Internal hemorrhoids.                           - The examination was otherwise normal on direct                            and retroflexion views. Recommendation:           - Repeat colonoscopy is not recommended for                             surveillance.                           - Patient has a contact number available for                            emergencies. The signs and symptoms of potential                            delayed complications were discussed with the                            patient. Return to normal activities tomorrow.                            Written discharge instructions were provided to the                            patient.                           -  Resume previous diet.                           - Continue present medications.                           - Await pathology results. Docia Chuck. Henrene Pastor, MD 06/18/2018 2:01:48 PM This report has been signed electronically.

## 2018-06-18 NOTE — Progress Notes (Signed)
Called to room to assist during endoscopic procedure.  Patient ID and intended procedure confirmed with present staff. Received instructions for my participation in the procedure from the performing physician.  

## 2018-06-18 NOTE — Progress Notes (Signed)
To recovery, report to RN, VSS. 

## 2018-06-19 ENCOUNTER — Telehealth: Payer: Self-pay

## 2018-06-19 ENCOUNTER — Other Ambulatory Visit: Payer: Self-pay

## 2018-06-19 DIAGNOSIS — R1310 Dysphagia, unspecified: Secondary | ICD-10-CM

## 2018-06-19 NOTE — Telephone Encounter (Signed)
  Follow up Call-  Call back number 06/18/2018  Post procedure Call Back phone  # [phone]  Permission to leave phone message Yes  Some recent data might be hidden     Patient questions:  Do you have a fever, pain , or abdominal swelling? No. Pain Score  0 *  Have you tolerated food without any problems? Yes.    Have you been able to return to your normal activities? Yes.    Do you have any questions about your discharge instructions: Diet   No. Medications  No. Follow up visit  No.  Do you have questions or concerns about your Care? No.  Actions: * If pain score is 4 or above: No action needed, pain <4.  No problems noted per pt. maw

## 2018-06-19 NOTE — Telephone Encounter (Signed)
Pt scheduled for Upper GI series at [facility] 07/05/18@10 :30am, pt to arrive there at 10:15am and be NPO after midnight. Left message for pt to call back.

## 2018-06-19 NOTE — Telephone Encounter (Signed)
Spoke with pt and she is aware.

## 2018-06-21 ENCOUNTER — Encounter: Payer: Self-pay | Admitting: Internal Medicine

## 2018-07-05 ENCOUNTER — Ambulatory Visit (HOSPITAL_COMMUNITY)
Admission: RE | Admit: 2018-07-05 | Discharge: 2018-07-05 | Disposition: A | Discharge status: Home / Self Care | Payer: Medicare Other | Source: Ambulatory Visit | Attending: Internal Medicine | Admitting: Internal Medicine

## 2018-07-05 DIAGNOSIS — K228 Other specified diseases of esophagus: Secondary | ICD-10-CM | POA: Diagnosis not present

## 2018-07-05 DIAGNOSIS — R1310 Dysphagia, unspecified: Secondary | ICD-10-CM | POA: Diagnosis not present

## 2018-07-05 DIAGNOSIS — K449 Diaphragmatic hernia without obstruction or gangrene: Secondary | ICD-10-CM | POA: Insufficient documentation

## 2018-07-09 ENCOUNTER — Ambulatory Visit (AMBULATORY_SURGERY_CENTER): Payer: Self-pay

## 2018-07-09 VITALS — Ht 62.5 in | Wt 126.0 lb

## 2018-07-09 DIAGNOSIS — K317 Polyp of stomach and duodenum: Secondary | ICD-10-CM

## 2018-07-09 NOTE — Progress Notes (Signed)
Per pt, no allergies to soy or egg products.Pt not taking any weight loss meds or using  O2 at home.  Pt refused emmi video.  Pt came into the office today for her pre-visit prior to her endoscopy on 07/17/18. The husband was with the pt. Pt was very confused about her procedure date, and was asking a lot questions about surgery. Pt states she was waiting for the results from her GI series last week and a surgery date. Informed pt I would have Dr Blanch Media nurse call her and review results with her and help answer all her questions. Pt understood. I answered all her questions and reviewed instructions regarding her endoscopy on 07/17/18. Gwyndolyn Saxon

## 2018-07-17 ENCOUNTER — Ambulatory Visit (AMBULATORY_SURGERY_CENTER): Payer: Medicare Other | Admitting: Internal Medicine

## 2018-07-17 ENCOUNTER — Encounter: Payer: Self-pay | Admitting: Internal Medicine

## 2018-07-17 VITALS — BP 136/62 | HR 64 | Temp 97.5°F | Resp 11 | Ht 62.0 in | Wt 126.0 lb

## 2018-07-17 DIAGNOSIS — K317 Polyp of stomach and duodenum: Secondary | ICD-10-CM | POA: Diagnosis not present

## 2018-07-17 DIAGNOSIS — K3189 Other diseases of stomach and duodenum: Secondary | ICD-10-CM

## 2018-07-17 DIAGNOSIS — D132 Benign neoplasm of duodenum: Secondary | ICD-10-CM

## 2018-07-17 MED ORDER — OMEPRAZOLE 40 MG PO CPDR: 40.0000 mg | DELAYED_RELEASE_CAPSULE | Freq: Every day | ORAL | 12 refills | Status: DC

## 2018-07-17 MED ORDER — SODIUM CHLORIDE 0.9 % IV SOLN: 500.0000 mL | Freq: Once | INTRAVENOUS | Status: DC

## 2018-07-17 MED ORDER — OMEPRAZOLE 40 MG PO CPDR: 40.0000 mg | DELAYED_RELEASE_CAPSULE | Freq: Every day | ORAL | 3 refills | Status: DC

## 2018-07-17 NOTE — Op Note (Signed)
[facility] Patient Name: Erika [name] Procedure Date: 07/17/2018 10:42 AM MRN: [id number] Endoscopist: Docia Chuck. Henrene Pastor , MD Age: 77 Referring MD:  Date of Birth: 1941/01/21 Gender: Female Account #: [id number] Procedure:                Upper GI endoscopy with cold biopsies and hot snare                            polypectomy x 1 Indications:              Therapeutic procedure, Benign duodenal tumor                            (adenoma). Previously incompletely resected                            elsewhere Medicines:                Monitored Anesthesia Care Procedure:                Pre-Anesthesia Assessment:                           - Prior to the procedure, a History and Physical                            was performed, and patient medications and                            allergies were reviewed. The patient's tolerance of                            previous anesthesia was also reviewed. The risks                            and benefits of the procedure and the sedation                            options and risks were discussed with the patient.                            All questions were answered, and informed consent                            was obtained. Prior Anticoagulants: The patient has                            taken no previous anticoagulant or antiplatelet                            agents. ASA Grade Assessment: II - A patient with                            mild systemic disease. After reviewing the risks  and benefits, the patient was deemed in                            satisfactory condition to undergo the procedure.                           After obtaining informed consent, the endoscope was                            passed under direct vision. Throughout the                            procedure, the patient's blood pressure, pulse, and                            oxygen saturations were monitored continuously. The                    Endoscope was introduced through the mouth, and                            advanced to the second part of duodenum. The upper                            GI endoscopy was accomplished without difficulty.                            The patient tolerated the procedure well. Scope In: Scope Out: Findings:                 The esophagus revealed a benign stricture at the                            gastroesophageal junction.                           The stomach was normal except for a large hiatal                            hernia.                           The examined duodenum revealed a 12 mm x 5 mm                            sessile polyp in the second portion. Biopsies were                            taken with a cold forceps for histology. Polyp was                            subsequently removed with hot snare technique.                           The cardia and gastric fundus were normal on  retroflexion save large hiatal hernia. Complications:            No immediate complications. Estimated Blood Loss:     Estimated blood loss: none. Impression:               1. Esophageal stricture and large hiatal hernia as                            previously noted                           2. Duodenal adenoma status post biopsies and hot                            snare resection. Recommendation:           - Patient has a contact number available for                            emergencies. The signs and symptoms of potential                            delayed complications were discussed with the                            patient. Return to normal activities tomorrow.                            Written discharge instructions were provided to the                            patient.                           - Resume previous diet.                           - Continue present medications.                           - Await pathology results.                            - Refill omeprazole 40 mg daily; #30; 12 refills.                            Confirm proper pharmacy with patient                           - Keep your surgical appointment with Dr. Excell Seltzer                           - Repeat upper endoscopy to reevaluate the duodenum                            in 1 year [name], MD 07/17/2018 11:19:38 AM This report has been signed electronically.

## 2018-07-17 NOTE — Progress Notes (Signed)
PT taken to PACU. Monitors in place. VSS. Report given to RN.PT taken to PACU. Monitors in place. VSS. Report given to RN. 

## 2018-07-17 NOTE — Patient Instructions (Signed)
YOU HAD AN ENDOSCOPIC PROCEDURE TODAY AT [facility]:   Refer to the procedure report that was given to you for any specific questions about what was found during the examination.  If the procedure report does not answer your questions, please call your gastroenterologist to clarify.  If you requested that your care partner not be given the details of your procedure findings, then the procedure report has been included in a sealed envelope for you to review at your convenience later.  YOU SHOULD EXPECT: Some feelings of bloating in the abdomen. Passage of more gas than usual.  Walking can help get rid of the air that was put into your GI tract during the procedure and reduce the bloating. If you had a lower endoscopy (such as a colonoscopy or flexible sigmoidoscopy) you [place] notice spotting of blood in your stool or on the toilet paper. If you underwent a bowel prep for your procedure, you [place] not have a normal bowel movement for a few days.  Please Note:  You might notice some irritation and congestion in your nose or some drainage.  This is from the oxygen used during your procedure.  There is no need for concern and it should clear up in a day or so.  SYMPTOMS TO REPORT IMMEDIATELY:    Following upper endoscopy (EGD)  Vomiting of blood or coffee ground material  New chest pain or pain under the shoulder blades  Painful or persistently difficult swallowing  New shortness of breath  Fever of 100F or higher  Black, tarry-looking stools  For urgent or emergent issues, a gastroenterologist can be reached at any hour by calling [phone].   DIET:  We do recommend a small meal at first, but then you [place] proceed to your regular diet.  Drink plenty of fluids but you should avoid alcoholic beverages for 24 hours.  MEDICATIONS: Continue present medications. Refill for Omeprazole 40 mg by mouth daily (#30 tablets with 23 refills) electronically sent to your pharmacy of  choice.  Follow Up: Keep your appointment with Dr. Excell Seltzer and repeat upper endoscopy to reevaluate duodenum in 1 year.  Please see handouts given to you by your recovery nurse.  ACTIVITY:  You should plan to take it easy for the rest of today and you should NOT DRIVE or use heavy machinery until tomorrow (because of the sedation medicines used during the test).    FOLLOW UP: Our staff will call the number listed on your records the next business day following your procedure to check on you and address any questions or concerns that you Colin have regarding the information given to you following your procedure. If we do not reach you, we will leave a message.  However, if you are feeling well and you are not experiencing any problems, there is no need to return our call.  We will assume that you have returned to your regular daily activities without incident.  If any biopsies were taken you will be contacted by phone or by letter within the next 1-3 weeks.  Please call us at [phone] if you have not heard about the biopsies in 3 weeks.   Thank you for allowing [place] to provide for your healthcare needs today.  SIGNATURES/CONFIDENTIALITY: You and/or your care partner have signed paperwork which will be entered into your electronic medical record.  These signatures attest to the fact that that the information above on your After Visit Summary has been reviewed and is understood.  Full responsibility of the confidentiality of this discharge information lies with you and/or your care-partner.

## 2018-07-17 NOTE — Progress Notes (Signed)
Pt's states no medical or surgical changes since previsit or office visit. 

## 2018-07-17 NOTE — Progress Notes (Signed)
Called to room to assist during endoscopic procedure.  Patient ID and intended procedure confirmed with present staff. Received instructions for my participation in the procedure from the performing physician.  

## 2018-07-18 ENCOUNTER — Telehealth: Payer: Self-pay | Admitting: *Deleted

## 2018-07-18 ENCOUNTER — Telehealth: Payer: Self-pay

## 2018-07-18 NOTE — Telephone Encounter (Signed)
No answer, or answering machine.  Unable to leave message.

## 2018-07-18 NOTE — Telephone Encounter (Signed)
  Follow up Call-  Call back number 07/17/2018 06/18/2018  Post procedure Call Back phone  # [phone]  Permission to leave phone message Yes Yes  Some recent data might be hidden     Patient questions:  Do you have a fever, pain , or abdominal swelling? No. Pain Score  0 *  Have you tolerated food without any problems? Yes.    Have you been able to return to your normal activities? Yes.    Do you have any questions about your discharge instructions: Diet   No. Medications  No. Follow up visit  No.  Do you have questions or concerns about your Care? No.  Actions: * If pain score is 4 or above: No action needed, pain <4.

## 2018-07-20 ENCOUNTER — Encounter: Payer: Medicare Other | Admitting: Gynecology

## 2018-07-24 ENCOUNTER — Encounter: Payer: Self-pay | Admitting: Internal Medicine

## 2018-07-27 ENCOUNTER — Ambulatory Visit: Payer: Self-pay | Admitting: General Surgery

## 2018-07-27 DIAGNOSIS — K449 Diaphragmatic hernia without obstruction or gangrene: Secondary | ICD-10-CM | POA: Diagnosis not present

## 2018-09-03 NOTE — Patient Instructions (Addendum)
Erika [name]  09/03/2018   Your procedure is scheduled on: 09-12-18  Report to [facility] Main  Entrance  Report to admitting at  630 AM    Call this number if you have problems the morning of surgery [phone]   Remember: Do not eat food  :After Midnight. BRUSH YOUR TEETH MORNING OF SURGERY AND RINSE YOUR MOUTH OUT, NO CHEWING GUM CANDY OR MINTS.  NO SOLID FOOD AFTER MIDNIGHT THE NIGHT PRIOR TO SURGERY. NOTHING BY MOUTH EXCEPT CLEAR LIQUIDS UNTIL 3 HOURS PRIOR TO [place] SURGERY. PLEASE FINISH ENSURE DRINK PER SURGEON ORDER 3 HOURS PRIOR TO SCHEDULED SURGERY TIME WHICH NEEDS TO BE COMPLETED AT 530 AM    CLEAR LIQUID DIET   Foods Allowed                                                                     Foods Excluded  Coffee and tea, regular and decaf                             liquids that you cannot  Plain Jell-O in any flavor                                             see through such as: Fruit ices (not with fruit pulp)                                     milk, soups, orange juice  Iced Popsicles                                    All solid food Carbonated beverages, regular and diet                                    Cranberry, grape and apple juices Sports drinks like Gatorade Lightly seasoned clear broth or consume(fat free) Sugar, honey syrup  Sample Menu Breakfast                                Lunch                                     Supper Cranberry juice                    Beef broth                            Chicken broth Jell-O  Grape juice                           Apple juice Coffee or tea                        Jell-O                                      Popsicle                                                Coffee or tea                        Coffee or tea  _____________________________________________________________________    Take these medicines the morning of surgery with A SIP OF  WATER: OMEPRAZOLE (PRILOSEC)                                You Margraf not have any metal on your body including hair pins and              piercings  Do not wear jewelry, make-up, lotions, powders or perfumes, deodorant             Do not wear nail polish.  Do not shave  48 hours prior to surgery.              Men [place]ten shave face and neck.   Do not bring valuables to the hospital. [place].  Contacts, dentures or bridgework Shugart not be worn into surgery.  Leave suitcase in the car. After surgery it Stirling be brought to your room.                  Please read over the following fact sheets you were given: _____________________________________________________________________   [facility] - Preparing for Surgery Before surgery, you can play an important role.  Because skin is not sterile, your skin needs to be as free of germs as possible.  You can reduce the number of germs on your skin by washing with CHG (chlorahexidine gluconate) soap before surgery.  CHG is an antiseptic cleaner which kills germs and bonds with the skin to continue killing germs even after washing. Please DO NOT use if you have an allergy to CHG or antibacterial soaps.  If your skin becomes reddened/irritated stop using the CHG and inform your nurse when you arrive at Short Stay. Do not shave (including legs and underarms) for at least 48 hours prior to the first CHG shower.  You Quintanar shave your face/neck. Please follow these instructions carefully:  1.  Shower with CHG Soap the night before surgery and the  morning of Surgery.  2.  If you choose to wash your hair, wash your hair first as usual with your  normal  shampoo.  3.  After you shampoo, rinse your hair and body thoroughly to remove the  shampoo.  4.  Use CHG as you would any other liquid soap.  You can apply chg directly  to the skin and wash                       Gently with a scrungie or clean  washcloth.  5.  Apply the CHG Soap to your body ONLY FROM THE NECK DOWN.   Do not use on face/ open                           Wound or open sores. Avoid contact with eyes, ears mouth and genitals (private parts).                       Wash face,  Genitals (private parts) with your normal soap.             6.  Wash thoroughly, paying special attention to the area where your surgery  will be performed.  7.  Thoroughly rinse your body with warm water from the neck down.  8.  DO NOT shower/wash with your normal soap after using and rinsing off  the CHG Soap.                9.  Pat yourself dry with a clean towel.            10.  Wear clean pajamas.            11.  Place clean sheets on your bed the night of your first shower and do not  sleep with pets. Day of Surgery : Do not apply any lotions/deodorants the morning of surgery.  Please wear clean clothes to the hospital/surgery center.  FAILURE TO FOLLOW THESE INSTRUCTIONS [place] RESULT IN THE CANCELLATION OF YOUR SURGERY PATIENT SIGNATURE_________________________________  NURSE SIGNATURE__________________________________  ________________________________________________________________________

## 2018-09-05 ENCOUNTER — Encounter (HOSPITAL_COMMUNITY): Payer: Self-pay

## 2018-09-05 ENCOUNTER — Other Ambulatory Visit: Payer: Self-pay

## 2018-09-05 ENCOUNTER — Encounter (HOSPITAL_COMMUNITY)
Admission: RE | Admit: 2018-09-05 | Discharge: 2018-09-05 | Disposition: A | Discharge status: Home / Self Care | Payer: Medicare Other | Source: Ambulatory Visit | Attending: General Surgery | Admitting: General Surgery

## 2018-09-05 DIAGNOSIS — Z01818 Encounter for other preprocedural examination: Secondary | ICD-10-CM | POA: Diagnosis not present

## 2018-09-05 HISTORY — DX: Congenital absence, atresia and stenosis of other parts of large intestine: Q42.8

## 2018-09-05 LAB — CBC
HCT: 37.8 % (ref 36.0–46.0)
Hemoglobin: 11.3 g/dL — ABNORMAL LOW (ref 12.0–15.0)
MCH: 26.6 pg (ref 26.0–34.0)
MCHC: 29.9 g/dL — ABNORMAL LOW (ref 30.0–36.0)
MCV: 88.9 fL (ref 80.0–100.0)
Platelets: 239 10*3/uL (ref 150–400)
RBC: 4.25 MIL/uL (ref 3.87–5.11)
RDW: 13.9 % (ref 11.5–15.5)
WBC: 8.2 10*3/uL (ref 4.0–10.5)
nRBC: 0 % (ref 0.0–0.2)

## 2018-09-05 LAB — BASIC METABOLIC PANEL
Anion gap: 9 (ref 5–15)
BUN: 22 mg/dL (ref 8–23)
CO2: 25 mmol/L (ref 22–32)
Calcium: 9.3 mg/dL (ref 8.9–10.3)
Chloride: 109 mmol/L (ref 98–111)
Creatinine, Ser: 0.98 mg/dL (ref 0.44–1.00)
GFR calc Af Amer: >60 mL/min (ref >60)
GFR calc non Af Amer: 55 mL/min — ABNORMAL LOW (ref >60)
Glucose, Bld: 76 mg/dL (ref 70–99)
Potassium: 4.1 mmol/L (ref 3.5–5.1)
Sodium: 143 mmol/L (ref 135–145)

## 2018-09-05 NOTE — Progress Notes (Signed)
Called dr Arelia Sneddon and last ekg 1991, left message at dr Darron Doom office to fax if any old ekg done. Dr Darron Doom is patient's current family md

## 2018-09-06 ENCOUNTER — Encounter (HOSPITAL_COMMUNITY): Payer: Self-pay | Admitting: Anesthesiology

## 2018-09-06 ENCOUNTER — Ambulatory Visit: Payer: Medicare Other | Admitting: Cardiology

## 2018-09-06 NOTE — Progress Notes (Deleted)
Cardiology Office Note   Date:  09/06/2018   ID:  Erika [name], DOB [birth year], MRN [id number]  PCP:  Hayden Rasmussen, MD  Cardiologist:   No primary care provider on file. Referring:  ***  No chief complaint on file.     History of Present Illness: Erika [name] is a 77 y.o. female who presents for evaluation of an abnormal EKG.   This was noted yesterday when she went preoperatively for an EKG.  She was found to have second-degree heart block Mobitz type II and a new interventricular conduction delay that was not present on a previous EKG.  It was suggested that she come in for preoperative evaluation because of this finding.   Past Medical History:  Diagnosis Date  . Absence of appendix, congenital   . Arthritis   . Colon polyps   . GERD (gastroesophageal reflux disease)   . Hiatal hernia   . Hypercholesterolemia   . Hypertension    NO MEDICINE  . Irritable bowel syndrome    resolved last few years  . Neck pain    right sided neck pain  . Osteoporosis 06/2017   T score -2.8  . Shoulder pain    09-17-15- no shoulder pain at present  . Sleep apnea    does not use c-pap did not like    Past Surgical History:  Procedure Laterality Date  . CATARACT EXTRACTION, BILATERAL Bilateral   . COLONOSCOPY    . cortisone shots in back    . ESOPHAGOGASTRODUODENOSCOPY    . ESOPHAGOGASTRODUODENOSCOPY N/A 08/14/2013   Procedure: ESOPHAGOGASTRODUODENOSCOPY (EGD);  Surgeon: Jeryl Columbia, MD;  Location: [facility];  Service: Endoscopy;  Laterality: N/A;  . ESOPHAGOGASTRODUODENOSCOPY (EGD) WITH PROPOFOL N/A 09/29/2015   Procedure: ESOPHAGOGASTRODUODENOSCOPY (EGD) WITH PROPOFOL;  Surgeon: Clarene Essex, MD;  Location: [facility];  Service: Endoscopy;  Laterality: N/A;  . FOOT SURGERY     BILATERAL  . HOT HEMOSTASIS N/A 08/14/2013   Procedure: HOT HEMOSTASIS (ARGON PLASMA COAGULATION/BICAP);  Surgeon: Jeryl Columbia, MD;  Location: [facility];  Service: Endoscopy;  Laterality: N/A;  .  HOT HEMOSTASIS N/A 09/29/2015   Procedure: HOT HEMOSTASIS (ARGON PLASMA COAGULATION/BICAP);  Surgeon: Clarene Essex, MD;  Location: [facility];  Service: Endoscopy;  Laterality: N/A;  . TUBAL LIGATION       Current Outpatient Medications  Medication Sig Dispense Refill  . alendronate (FOSAMAX) 70 MG tablet Take 1 tablet (70 mg total) by mouth every 7 (seven) days. Take with a full glass of water on an empty stomach. 12 tablet 4  . calcium carbonate (OSCAL) 1500 (600 Ca) MG TABS tablet Take 600 mg of elemental calcium by mouth daily.     . carbonyl iron (FEOSOL) 45 MG TABS tablet Take 45 mg by mouth at bedtime.    . Cholecalciferol (VITAMIN D-3) 125 MCG (5000 UT) TABS Take 10,000 Units by mouth daily.    . Cyanocobalamin (VITAMIN B 12 PO) Take 1 tablet by mouth every evening.     Marland Kitchen omeprazole (PRILOSEC) 40 MG capsule Take 1 capsule (40 mg total) by mouth daily. 90 capsule 3  . Probiotic Product (HEALTHY COLON PO) Take 1 capsule by mouth daily.      No current facility-administered medications for this visit.     Allergies:   Patient has no known allergies.    Social History:  The patient  reports that she has never smoked. She has never used smokeless tobacco. She reports that she  does not drink alcohol or use drugs.   Family History:  The patient's ***family history includes Heart disease in her father, paternal uncle, paternal uncle, paternal uncle, and paternal uncle; Kidney failure in her mother.    ROS:  Please see the history of present illness.   Otherwise, review of systems are positive for {NONE DEFAULTED:18576::"none"}.   All other systems are reviewed and negative.    PHYSICAL EXAM: VS:  There were no vitals taken for this visit. , BMI There is no height or weight on file to calculate BMI. GENERAL:  Well appearing HEENT:  Pupils equal round and reactive, fundi not visualized, oral mucosa unremarkable NECK:  No jugular venous distention, waveform within normal limits, carotid  upstroke brisk and symmetric, no bruits, no thyromegaly LYMPHATICS:  No cervical, inguinal adenopathy LUNGS:  Clear to auscultation bilaterally BACK:  No CVA tenderness CHEST:  Unremarkable HEART:  PMI not displaced or sustained,S1 and S2 within normal limits, no S3, no S4, no clicks, no rubs, *** murmurs ABD:  Flat, positive bowel sounds normal in frequency in pitch, no bruits, no rebound, no guarding, no midline pulsatile mass, no hepatomegaly, no splenomegaly EXT:  2 plus pulses throughout, no edema, no cyanosis no clubbing SKIN:  No rashes no nodules NEURO:  Cranial nerves II through XII grossly intact, motor grossly intact throughout PSYCH:  Cognitively intact, oriented to person place and time    EKG:  EKG {ACTION; IS/IS WUJ:[id number]} ordered today. The ekg ordered today demonstrates ***   Recent Labs: 09/05/2018: BUN 22; Creatinine, Ser 0.98; Hemoglobin 11.3; Platelets 239; Potassium 4.1; Sodium 143    Lipid Panel No results found for: CHOL, TRIG, HDL, CHOLHDL, VLDL, LDLCALC, LDLDIRECT    Wt Readings from Last 3 Encounters:  09/05/18 126 lb 9.6 oz (57.4 kg)  07/17/18 126 lb (57.2 kg)  07/09/18 126 lb (57.2 kg)      Other studies Reviewed: Additional studies/ records that were reviewed today include: ***. Review of the above records demonstrates:  Please see elsewhere in the note.  ***   ASSESSMENT AND PLAN:  BRADYCARDIA:  ***   Current medicines are reviewed at length with the patient today.  The patient {ACTIONS; HAS/DOES NOT HAVE:19233} concerns regarding medicines.  The following changes have been made:  {PLAN; NO CHANGE:13088:s}  Labs/ tests ordered today include: *** No orders of the defined types were placed in this encounter.    Disposition:   FU with ***    Signed, Minus Breeding, MD  09/06/2018 12:52 PM    [facility]

## 2018-09-07 ENCOUNTER — Ambulatory Visit (INDEPENDENT_AMBULATORY_CARE_PROVIDER_SITE_OTHER): Payer: Medicare Other | Admitting: Internal Medicine

## 2018-09-07 ENCOUNTER — Encounter: Payer: Self-pay | Admitting: Internal Medicine

## 2018-09-07 ENCOUNTER — Ambulatory Visit: Payer: Medicare Other | Admitting: Internal Medicine

## 2018-09-07 VITALS — BP 158/80 | HR 66 | Ht 63.0 in | Wt 128.4 lb

## 2018-09-07 DIAGNOSIS — Z01810 Encounter for preprocedural cardiovascular examination: Secondary | ICD-10-CM

## 2018-09-07 DIAGNOSIS — I441 Atrioventricular block, second degree: Secondary | ICD-10-CM

## 2018-09-07 NOTE — Patient Instructions (Signed)
Medication Instructions:  Continue current medications If you need a refill on your cardiac medications before your next appointment, please call your pharmacy.   Testing/Procedures: Your physician has requested that you have an echocardiogram. Echocardiography is a painless test that uses sound waves to create images of your heart. It provides your doctor with information about the size and shape of your heart and how well your heart's chambers and valves are working. This procedure takes approximately one hour. There are no restrictions for this procedure.  Your physcian has requested that you wear a monitor for 3 days. (ZIO)  Both appointments will be at [address] - [address] Floor  Follow-Up: At [facility], you and your health needs are our priority.  As part of our continuing mission to provide you with exceptional heart care, we have created designated Provider Care Teams.  These Care Teams include your primary Cardiologist (physician) and Advanced Practice Providers (APPs -  Physician Assistants and Nurse Practitioners) who all work together to provide you with the care you need, when you need it. You will need a follow up appointment after your echo & monitor. You Gura see Dr. Debara Pickett or one of the following Advanced Practice Providers on your designated Care Team: Almyra Deforest, [place] . Fabian Sharp, PA-C  Any Other Special Instructions Will Be Listed Below (If Applicable).

## 2018-09-07 NOTE — Progress Notes (Signed)
Spoke with dr Laurie Panda anesthsia about 09-05-18 ekg results, patient needs to see cardiology as soon as possible per dr Ermalene Postin. Called dr Santiago Glad [facility]ter pcp office and spoke with Lauman and made aware 09-05-18 ekg results and patient needs to see cardiology as soon as possible per dr Ermalene Postin. Faxed 09-05-18 ekg to dr Darron Doom [phone] fax confirmation received and placed on chart. Called wendy at [facility] and made aware patient in second degree heart block and needs to see cardiology and patient pcp dr Horald Pollen has been notified.

## 2018-09-10 ENCOUNTER — Encounter: Payer: Self-pay | Admitting: Internal Medicine

## 2018-09-10 DIAGNOSIS — I441 Atrioventricular block, second degree: Secondary | ICD-10-CM | POA: Insufficient documentation

## 2018-09-10 DIAGNOSIS — Z01810 Encounter for preprocedural cardiovascular examination: Secondary | ICD-10-CM | POA: Insufficient documentation

## 2018-09-10 NOTE — Progress Notes (Signed)
OFFICE CONSULT NOTE  Chief Complaint:  Surgical clearance  Primary Care Physician: Hayden Rasmussen, MD  HPI:  Erika [name] is a 77 y.o. female who is being seen today for the evaluation of surgical risk at the request of Dr. Lavinia Sharps. This is a pleasant 77 year old female was kindly referred to me for surgical risk evaluation.  Past medical history significant for GERD and osteopenia as well as arthritis, dyslipidemia and sleep apnea.  She recently has been having problems with GERD and underwent upper endoscopy showing a large adenoma.  She was found to have a large hiatal hernia and surgery was recommended to repair it.  Dr. Excell Seltzer is planning a laparoscopic repair of the hernia with Nissen fundoplication.  During preoperative work-up she underwent an EKG which showed asymptomatic sinus bradycardia and type II AV block with mild interventricular conduction delay.  I repeated her EKG today which shows normal sinus rhythm at 66 with interventricular conduction delay and left bundle pattern.  There is no evidence of heart block today.  She denies any chest pain or shortness of breath.  She is on no AV nodal blocking agents.  PMHx:  Past Medical History:  Diagnosis Date  . Absence of appendix, congenital   . Arthritis   . Colon polyps   . GERD (gastroesophageal reflux disease)   . Hiatal hernia   . Hypercholesterolemia   . Hypertension    NO MEDICINE  . Irritable bowel syndrome    resolved last few years  . Neck pain    right sided neck pain  . Osteoporosis 06/2017   T score -2.8  . Shoulder pain    09-17-15- no shoulder pain at present  . Sleep apnea    does not use c-pap did not like    Past Surgical History:  Procedure Laterality Date  . CATARACT EXTRACTION, BILATERAL Bilateral   . COLONOSCOPY    . cortisone shots in back    . ESOPHAGOGASTRODUODENOSCOPY    . ESOPHAGOGASTRODUODENOSCOPY N/A 08/14/2013   Procedure: ESOPHAGOGASTRODUODENOSCOPY (EGD);  Surgeon: Jeryl Columbia,  MD;  Location: [facility];  Service: Endoscopy;  Laterality: N/A;  . ESOPHAGOGASTRODUODENOSCOPY (EGD) WITH PROPOFOL N/A 09/29/2015   Procedure: ESOPHAGOGASTRODUODENOSCOPY (EGD) WITH PROPOFOL;  Surgeon: Clarene Essex, MD;  Location: [facility];  Service: Endoscopy;  Laterality: N/A;  . FOOT SURGERY     BILATERAL  . HOT HEMOSTASIS N/A 08/14/2013   Procedure: HOT HEMOSTASIS (ARGON PLASMA COAGULATION/BICAP);  Surgeon: Jeryl Columbia, MD;  Location: [facility];  Service: Endoscopy;  Laterality: N/A;  . HOT HEMOSTASIS N/A 09/29/2015   Procedure: HOT HEMOSTASIS (ARGON PLASMA COAGULATION/BICAP);  Surgeon: Clarene Essex, MD;  Location: [facility];  Service: Endoscopy;  Laterality: N/A;  . TUBAL LIGATION      FAMHx:  Family History  Problem Relation Age of Onset  . Heart disease Father        MI  . Kidney failure Mother        pregnant with twins  . Heart disease Paternal Uncle        MI  . Heart disease Paternal Uncle        MI  . Heart disease Paternal Uncle        MI  . Heart disease Paternal Uncle        MI  . Colon cancer Neg Hx   . Liver cancer Neg Hx   . Esophageal cancer Neg Hx   . Rectal cancer Neg Hx   . Stomach  cancer Neg Hx     SOCHx:   reports that she has never smoked. She has never used smokeless tobacco. She reports that she does not drink alcohol or use drugs.  ALLERGIES:  No Known Allergies  ROS: Pertinent items noted in HPI and remainder of comprehensive ROS otherwise negative.  HOME MEDS: Current Outpatient Medications on File Prior to Visit  Medication Sig Dispense Refill  . alendronate (FOSAMAX) 70 MG tablet Take 1 tablet (70 mg total) by mouth every 7 (seven) days. Take with a full glass of water on an empty stomach. 12 tablet 4  . calcium carbonate (OSCAL) 1500 (600 Ca) MG TABS tablet Take 600 mg of elemental calcium by mouth daily.     . carbonyl iron (FEOSOL) 45 MG TABS tablet Take 45 mg by mouth at bedtime.    . Cholecalciferol (VITAMIN D-3) 125 MCG (5000  UT) TABS Take 10,000 Units by mouth daily.    . Cyanocobalamin (VITAMIN B 12 PO) Take 1 tablet by mouth every evening.     Marland Kitchen omeprazole (PRILOSEC) 40 MG capsule Take 1 capsule (40 mg total) by mouth daily. 90 capsule 3  . Probiotic Product (HEALTHY COLON PO) Take 1 capsule by mouth daily.      No current facility-administered medications on file prior to visit.     LABS/IMAGING: No results found for this or any previous visit (from the past 48 hour(s)). No results found.  LIPID PANEL: No results found for: CHOL, TRIG, HDL, CHOLHDL, VLDL, LDLCALC, LDLDIRECT  WEIGHTS: Wt Readings from Last 3 Encounters:  09/07/18 128 lb 6.4 oz (58.2 kg)  09/05/18 126 lb 9.6 oz (57.4 kg)  07/17/18 126 lb (57.2 kg)    VITALS: BP (!) 158/80   Pulse 66   Ht 5\' 3"  (1.6 m)   Wt 128 lb 6.4 oz (58.2 kg)   BMI 22.75 kg/m   EXAM: General appearance: alert and no distress Neck: no carotid bruit, no JVD and thyroid not enlarged, symmetric, no tenderness/mass/nodules Lungs: clear to auscultation bilaterally Heart: regular rate and rhythm Abdomen: soft, non-tender; bowel sounds normal; no masses,  no organomegaly Extremities: extremities normal, atraumatic, no cyanosis or edema Pulses: 2+ and symmetric Skin: Skin color, texture, turgor normal. No rashes or lesions Neurologic: Grossly normal Psych: Pleasant  EKG: Sinus rhythm at 66, IVCD- personally reviewed  ASSESSMENT: 1. Preoperative risk assessment 2. Intermittent Mobitz 2 AV block, asymptomatic  PLAN: 1.   Ms. [name] noted to have intermittent Mobitz 2 AV block for which she is asymptomatic.  This was not noted on EKG today.  She is planning on having a laparoscopic Nissen fundoplication for treatment of large hiatal hernia.  I would recommend we hold on that procedure for now until we can further assess her cardiovascular risk.  I like to place a 3-day monitor to see if she is having any recurrent high degree AV block.  Obviously avoid any AV  nodal blocking agents.  Will also obtain an echocardiogram.  If the echo is structurally normal, then I would feel more comfortable about her undergoing surgery.  Plan follow-up afterwards.  Thanks for the kind referral.  Pixie Casino, MD, FACC, [place] Director of the [facility] Lipid [facility]  Card[facility]ovascular [facility] Reduction [facility] Diplomate of the American Board of Clinical Lipidology Attending Cardiologist  Direct Dial: [phone]  Fax: [phone]  Website:  [email or web address] 09/10/2018, 2:10 PM

## 2018-09-12 ENCOUNTER — Ambulatory Visit (HOSPITAL_COMMUNITY): Admission: RE | Admit: 2018-09-12 | Payer: Medicare Other | Source: Home / Self Care | Admitting: General Surgery

## 2018-09-12 ENCOUNTER — Encounter (HOSPITAL_COMMUNITY): Admission: RE | Payer: Self-pay | Source: Home / Self Care

## 2018-09-12 SURGERY — REPAIR, HERNIA, HIATAL, LAPAROSCOPIC
Anesthesia: General

## 2018-09-24 ENCOUNTER — Ambulatory Visit (INDEPENDENT_AMBULATORY_CARE_PROVIDER_SITE_OTHER): Payer: Medicare Other

## 2018-09-24 ENCOUNTER — Ambulatory Visit (HOSPITAL_COMMUNITY): Payer: Medicare Other | Attending: Cardiology

## 2018-09-24 ENCOUNTER — Other Ambulatory Visit: Payer: Self-pay

## 2018-09-24 DIAGNOSIS — I441 Atrioventricular block, second degree: Secondary | ICD-10-CM | POA: Insufficient documentation

## 2018-09-24 DIAGNOSIS — Z01810 Encounter for preprocedural cardiovascular examination: Secondary | ICD-10-CM | POA: Insufficient documentation

## 2018-09-27 ENCOUNTER — Ambulatory Visit (INDEPENDENT_AMBULATORY_CARE_PROVIDER_SITE_OTHER): Payer: Medicare Other | Admitting: Physician Assistant

## 2018-09-27 ENCOUNTER — Encounter: Payer: Self-pay | Admitting: Physician Assistant

## 2018-09-27 ENCOUNTER — Telehealth: Payer: Self-pay

## 2018-09-27 VITALS — BP 134/82 | HR 77 | Ht 63.0 in | Wt 124.2 lb

## 2018-09-27 DIAGNOSIS — I519 Heart disease, unspecified: Secondary | ICD-10-CM | POA: Diagnosis not present

## 2018-09-27 DIAGNOSIS — Z01810 Encounter for preprocedural cardiovascular examination: Secondary | ICD-10-CM

## 2018-09-27 DIAGNOSIS — I441 Atrioventricular block, second degree: Secondary | ICD-10-CM

## 2018-09-27 DIAGNOSIS — R931 Abnormal findings on diagnostic imaging of heart and coronary circulation: Secondary | ICD-10-CM | POA: Diagnosis not present

## 2018-09-27 MED ORDER — ASPIRIN 81 MG PO TABS: 81.0000 mg | ORAL_TABLET | Freq: Every day | ORAL | 1 refills | Status: DC

## 2018-09-27 MED ORDER — LOSARTAN POTASSIUM 25 MG PO TABS: 25.0000 mg | ORAL_TABLET | Freq: Every day | ORAL | 3 refills | Status: DC

## 2018-09-27 NOTE — Telephone Encounter (Signed)
Called patient per Erika [name] to ask if patient wanted to move her appointment to next week to have all the monitor results and echo results in for her clearance. Patient requested to keep appointment as scheduled for today, and to call with the results of the monitor.

## 2018-09-27 NOTE — Progress Notes (Signed)
Cardiology Office Note   Date:  09/27/2018   ID:  Halfway Cohick Baack, DOB [birth year], MRN [id number]  PCP:  Hayden Rasmussen, MD Cardiologist:  Pixie Casino, MD  09/07/2018 Erika Ferries, PA-C   No chief complaint on file.   History of Present Illness: Erika [name] is a 78 y.o. female with a history of HTN, HLD, GERD, IBS, OSA not on CPAP  12/13 Office visit for preop eval for hilar hernia repair with Nissen fundoplication and removal of a large adenoma.  Sinus bradycardia was seen with a left bundle and cardiology valuation requested.  Remittent Mobitz 2 block was seen, 3-day monitor to evaluate for high rate AV block, no AV nodal blocking agents on board and echo ordered  Erika [name] presents for cardiology follow up.  She never gets light-headed or dizzy. No presyncope or syncope.   No palpitations, never aware of her heart rate.   She does things around the house. She attends bluegrass dances with her husband. He dances, she does not.   She walks about 1/2 mile 3-4 x week. No chest pain or SOB with this. Daughter lives down the road, she walks when she visits, so gets a mile by the time she goes and comes back.   Wakes w/ swelling in her feet sometimes, not on a regular basis. The last time she had chicken soup, she woke w/ the swelling the next day. She made the soup, but did add salt. She likes salt.   Feels tired a lot of the time. She feels a little weak at times. She normally does a lot of crafts, has not felt like doing much lately.   Past Medical History:  Diagnosis Date  . Absence of appendix, congenital   . Arthritis   . Colon polyps   . GERD (gastroesophageal reflux disease)   . Hiatal hernia   . Hypercholesterolemia   . Hypertension    NO MEDICINE  . Irritable bowel syndrome    resolved last few years  . Neck pain    right sided neck pain  . Osteoporosis 06/2017   T score -2.8  . Shoulder pain    09-17-15- no shoulder pain at present  . Sleep apnea    does not use c-pap did not like    Past Surgical History:  Procedure Laterality Date  . CATARACT EXTRACTION, BILATERAL Bilateral   . COLONOSCOPY    . cortisone shots in back    . ESOPHAGOGASTRODUODENOSCOPY    . ESOPHAGOGASTRODUODENOSCOPY N/A 08/14/2013   Procedure: ESOPHAGOGASTRODUODENOSCOPY (EGD);  Surgeon: Jeryl Columbia, MD;  Location: [facility];  Service: Endoscopy;  Laterality: N/A;  . ESOPHAGOGASTRODUODENOSCOPY (EGD) WITH PROPOFOL N/A 09/29/2015   Procedure: ESOPHAGOGASTRODUODENOSCOPY (EGD) WITH PROPOFOL;  Surgeon: Clarene Essex, MD;  Location: [facility];  Service: Endoscopy;  Laterality: N/A;  . FOOT SURGERY     BILATERAL  . HOT HEMOSTASIS N/A 08/14/2013   Procedure: HOT HEMOSTASIS (ARGON PLASMA COAGULATION/BICAP);  Surgeon: Jeryl Columbia, MD;  Location: [facility];  Service: Endoscopy;  Laterality: N/A;  . HOT HEMOSTASIS N/A 09/29/2015   Procedure: HOT HEMOSTASIS (ARGON PLASMA COAGULATION/BICAP);  Surgeon: Clarene Essex, MD;  Location: [facility];  Service: Endoscopy;  Laterality: N/A;  . TUBAL LIGATION      Current Outpatient Medications  Medication Sig Dispense Refill  . alendronate (FOSAMAX) 70 MG tablet Take 1 tablet (70 mg total) by mouth every 7 (seven) days. Take with a full glass of water on  an empty stomach. 12 tablet 4  . calcium carbonate (OSCAL) 1500 (600 Ca) MG TABS tablet Take 600 mg of elemental calcium by mouth daily.     . carbonyl iron (FEOSOL) 45 MG TABS tablet Take 45 mg by mouth at bedtime.    . Cholecalciferol (VITAMIN D-3) 125 MCG (5000 UT) TABS Take 10,000 Units by mouth daily.    . Cyanocobalamin (VITAMIN B 12 PO) Take 1 tablet by mouth every evening.     Marland Kitchen omeprazole (PRILOSEC) 40 MG capsule Take 1 capsule (40 mg total) by mouth daily. 90 capsule 3  . Probiotic Product (HEALTHY COLON PO) Take 1 capsule by mouth daily.      No current facility-administered medications for this visit.     Allergies:   Patient has no known allergies.    Social  History:  The patient  reports that she has never smoked. She has never used smokeless tobacco. She reports that she does not drink alcohol or use drugs.   Family History:  The patient's family history includes Heart disease in her father, paternal uncle, paternal uncle, paternal uncle, and paternal uncle; Kidney failure in her mother.  She indicated that her mother is deceased. She indicated that her father is deceased. She indicated that the status of her neg hx is unknown.   ROS:  Please see the history of present illness. All other systems are reviewed and negative.    PHYSICAL EXAM: VS:  BP 134/82   Pulse 77   Ht 5\' 3"  (1.6 m)   Wt 124 lb 3.2 oz (56.3 kg)   BMI 22.00 kg/m  , BMI Body mass index is 22 kg/m. GEN: Well nourished, well developed, female in no acute distress HEENT: normal for age  Neck: minimal JVD, no carotid bruit, no masses Cardiac: RRR; short systolic murmur, no rubs, or gallops Respiratory:  clear to auscultation bilaterally, normal work of breathing GI: soft, nontender, nondistended, + BS MS: no deformity or atrophy; no edema; distal pulses are 2+ in all 4 extremities  Skin: warm and dry, no rash Neuro:  Strength and sensation are intact Psych: euthymic mood, full affect   EKG:  EKG is not ordered today.  ECHO: 09/24/2018 - Left ventricle: The cavity size was normal. Wall thickness was   increased in a pattern of mild LVH. Systolic function was mildly   to moderately reduced. The estimated ejection fraction was in the   range of 40% to 45%. There is hypokinesis of the anteroseptal   myocardium. Doppler parameters are consistent with abnormal left   ventricular relaxation (grade 1 diastolic dysfunction). Doppler   parameters are consistent with high ventricular filling pressure. - Mitral valve: Calcified annulus. There was mild regurgitation. - Left atrium: The atrium was mildly dilated. - Pulmonary arteries: Systolic pressure was mildly increased. PA    peak pressure: 35 mm Hg (S). - Pericardium, extracardiac: A moderate pericardial effusion was   identified. There was right atrial chamber collapse.  Impressions:  - Hypokinesis of the septum with overall mild to moderate LV   dysfunction; mild LVH; mild diastolic dysfunction with elevated   LV filling pressure; mild MR; mild LAE; mild TR; mild pulmonary   hypertension; moderate pericardial effusion; evidence of RA   collapse and borderline RV collapse; howevere IVC collapses.   Discussed with DOD (Dr Lovena Le) who will arrange FU for pt;   previously seen by Dr Debara Pickett.   Recent Labs: 09/05/2018: BUN 22; Creatinine, Ser 0.98; Hemoglobin 11.3; Platelets  239; Potassium 4.1; Sodium 143  CBC    Component Value Date/Time   WBC 8.2 09/05/2018 1400   RBC 4.25 09/05/2018 1400   HGB 11.3 (L) 09/05/2018 1400   HCT 37.8 09/05/2018 1400   PLT 239 09/05/2018 1400   MCV 88.9 09/05/2018 1400   MCH 26.6 09/05/2018 1400   MCHC 29.9 (L) 09/05/2018 1400   RDW 13.9 09/05/2018 1400   LYMPHSABS 2.5 02/10/2012 1106   MONOABS 0.5 02/10/2012 1106   EOSABS 0.2 02/10/2012 1106   BASOSABS 0.0 02/10/2012 1106   CMP Latest Ref Rng & Units 09/05/2018 04/17/2018  Glucose 70 - 99 mg/dL 76 93  BUN 8 - 23 mg/dL 22 16  Creatinine 0.44 - 1.00 mg/dL 0.98 0.99  Sodium 135 - 145 mmol/L 143 137  Potassium 3.5 - 5.1 mmol/L 4.1 4.1  Chloride 98 - 111 mmol/L 109 101  CO2 22 - 32 mmol/L 25 27  Calcium 8.9 - 10.3 mg/dL 9.3 9.6     Lipid Panel No results found for: CHOL, HDL, LDLCALC, LDLDIRECT, TRIG, CHOLHDL    Wt Readings from Last 3 Encounters:  09/27/18 124 lb 3.2 oz (56.3 kg)  09/07/18 128 lb 6.4 oz (58.2 kg)  09/05/18 126 lb 9.6 oz (57.4 kg)     Other studies Reviewed: Additional studies/ records that were reviewed today include: office notes, hospital records and testing.  ASSESSMENT AND PLAN:  1.  LVD, abnormal echo: results reviewed w/ Dr Martinique  -With decreased EF and regional wall motion  abnormality, ischemic evaluation needed. - Cardiac catheterization is indicated. - The risks and benefits of a cardiac catheterization including, but not limited to, death, stroke, MI, kidney damage and bleeding were discussed with the patient who indicates understanding and agrees to proceed.  -We will get labs today, ECG is acceptable.  She is encouraged to get this done as soon as possible and she is agreeable to doing it 1 day next week.  No medications will need to be held except the losartan. -Add aspirin 81 mg daily, draw labs today.  2.  Second-degree AV block, type II, sinus bradycardia:  -No rate lowering medications -Unclear if symptoms are related to decreased heart rate or decreased EF - Follow-up on ZIO monitor results and let the patient know what was found. - Currently, no clear indication for pacemaker  3.  Left ventricular dysfunction: -No beta-blocker due to resting bradycardia -Minimal volume overload on exam and PAS only 35, will not add daily diuretic. -Patient to increase awareness of the amount of sodium in foods and let us know if she develops additional symptoms - Add low-dose losartan and see how tolerated   Current medicines are reviewed at length with the patient today.  The patient does not have concerns regarding medicines.  The following changes have been made: Add aspirin and losartan  Labs/ tests ordered today include:   Orders Placed This Encounter  Procedures  . CBC  . Basic metabolic panel     Disposition:   FU with Pixie Casino, MD  Signed, Erika Ferries, PA-C  09/27/2018 4:21 PM    [facility] Phone: [phone]; Fax: [phone]

## 2018-09-27 NOTE — H&P (View-Only) (Signed)
Cardiology Office Note   Date:  09/27/2018   ID:  Erika [name], DOB Hobbs 25, 1942, MRN [id number]  PCP:  Hayden Rasmussen, MD Cardiologist:  Pixie Casino, MD  09/07/2018 Rosaria Ferries, PA-C   No chief complaint on file.   History of Present Illness: Erika [name] is a 78 y.o. female with a history of HTN, HLD, GERD, IBS, OSA not on CPAP  12/13 Office visit for preop eval for hilar hernia repair with Nissen fundoplication and removal of a large adenoma.  Sinus bradycardia was seen with a left bundle and cardiology valuation requested.  Remittent Mobitz 2 block was seen, 3-day monitor to evaluate for high rate AV block, no AV nodal blocking agents on board and echo ordered  Erika [name] presents for cardiology follow up.  She never gets light-headed or dizzy. No presyncope or syncope.   No palpitations, never aware of her heart rate.   She does things around the house. She attends bluegrass dances with her husband. He dances, she does not.   She walks about 1/2 mile 3-4 x week. No chest pain or SOB with this. Daughter lives down the road, she walks when she visits, so gets a mile by the time she goes and comes back.   Wakes w/ swelling in her feet sometimes, not on a regular basis. The last time she had chicken soup, she woke w/ the swelling the next day. She made the soup, but did add salt. She likes salt.   Feels tired a lot of the time. She feels a little weak at times. She normally does a lot of crafts, has not felt like doing much lately.   Past Medical History:  Diagnosis Date  . Absence of appendix, congenital   . Arthritis   . Colon polyps   . GERD (gastroesophageal reflux disease)   . Hiatal hernia   . Hypercholesterolemia   . Hypertension    NO MEDICINE  . Irritable bowel syndrome    resolved last few years  . Neck pain    right sided neck pain  . Osteoporosis 06/2017   T score -2.8  . Shoulder pain    09-17-15- no shoulder pain at present  . Sleep apnea    does not use c-pap did not like    Past Surgical History:  Procedure Laterality Date  . CATARACT EXTRACTION, BILATERAL Bilateral   . COLONOSCOPY    . cortisone shots in back    . ESOPHAGOGASTRODUODENOSCOPY    . ESOPHAGOGASTRODUODENOSCOPY N/A 08/14/2013   Procedure: ESOPHAGOGASTRODUODENOSCOPY (EGD);  Surgeon: Jeryl Columbia, MD;  Location: [facility];  Service: Endoscopy;  Laterality: N/A;  . ESOPHAGOGASTRODUODENOSCOPY (EGD) WITH PROPOFOL N/A 09/29/2015   Procedure: ESOPHAGOGASTRODUODENOSCOPY (EGD) WITH PROPOFOL;  Surgeon: Clarene Essex, MD;  Location: [facility];  Service: Endoscopy;  Laterality: N/A;  . FOOT SURGERY     BILATERAL  . HOT HEMOSTASIS N/A 08/14/2013   Procedure: HOT HEMOSTASIS (ARGON PLASMA COAGULATION/BICAP);  Surgeon: Jeryl Columbia, MD;  Location: [facility];  Service: Endoscopy;  Laterality: N/A;  . HOT HEMOSTASIS N/A 09/29/2015   Procedure: HOT HEMOSTASIS (ARGON PLASMA COAGULATION/BICAP);  Surgeon: Clarene Essex, MD;  Location: [facility];  Service: Endoscopy;  Laterality: N/A;  . TUBAL LIGATION      Current Outpatient Medications  Medication Sig Dispense Refill  . alendronate (FOSAMAX) 70 MG tablet Take 1 tablet (70 mg total) by mouth every 7 (seven) days. Take with a full glass of water on  an empty stomach. 12 tablet 4  . calcium carbonate (OSCAL) 1500 (600 Ca) MG TABS tablet Take 600 mg of elemental calcium by mouth daily.     . carbonyl iron (FEOSOL) 45 MG TABS tablet Take 45 mg by mouth at bedtime.    . Cholecalciferol (VITAMIN D-3) 125 MCG (5000 UT) TABS Take 10,000 Units by mouth daily.    . Cyanocobalamin (VITAMIN B 12 PO) Take 1 tablet by mouth every evening.     Marland Kitchen omeprazole (PRILOSEC) 40 MG capsule Take 1 capsule (40 mg total) by mouth daily. 90 capsule 3  . Probiotic Product (HEALTHY COLON PO) Take 1 capsule by mouth daily.      No current facility-administered medications for this visit.     Allergies:   Patient has no known allergies.    Social  History:  The patient  reports that she has never smoked. She has never used smokeless tobacco. She reports that she does not drink alcohol or use drugs.   Family History:  The patient's family history includes Heart disease in her father, paternal uncle, paternal uncle, paternal uncle, and paternal uncle; Kidney failure in her mother.  She indicated that her mother is deceased. She indicated that her father is deceased. She indicated that the status of her neg hx is unknown.   ROS:  Please see the history of present illness. All other systems are reviewed and negative.    PHYSICAL EXAM: VS:  BP 134/82   Pulse 77   Ht 5\' 3"  (1.6 m)   Wt 124 lb 3.2 oz (56.3 kg)   BMI 22.00 kg/m  , BMI Body mass index is 22 kg/m. GEN: Well nourished, well developed, female in no acute distress HEENT: normal for age  Neck: minimal JVD, no carotid bruit, no masses Cardiac: RRR; short systolic murmur, no rubs, or gallops Respiratory:  clear to auscultation bilaterally, normal work of breathing GI: soft, nontender, nondistended, + BS MS: no deformity or atrophy; no edema; distal pulses are 2+ in all 4 extremities  Skin: warm and dry, no rash Neuro:  Strength and sensation are intact Psych: euthymic mood, full affect   EKG:  EKG is not ordered today.  ECHO: 09/24/2018 - Left ventricle: The cavity size was normal. Wall thickness was   increased in a pattern of mild LVH. Systolic function was mildly   to moderately reduced. The estimated ejection fraction was in the   range of 40% to 45%. There is hypokinesis of the anteroseptal   myocardium. Doppler parameters are consistent with abnormal left   ventricular relaxation (grade 1 diastolic dysfunction). Doppler   parameters are consistent with high ventricular filling pressure. - Mitral valve: Calcified annulus. There was mild regurgitation. - Left atrium: The atrium was mildly dilated. - Pulmonary arteries: Systolic pressure was mildly increased. PA    peak pressure: 35 mm Hg (S). - Pericardium, extracardiac: A moderate pericardial effusion was   identified. There was right atrial chamber collapse.  Impressions:  - Hypokinesis of the septum with overall mild to moderate LV   dysfunction; mild LVH; mild diastolic dysfunction with elevated   LV filling pressure; mild MR; mild LAE; mild TR; mild pulmonary   hypertension; moderate pericardial effusion; evidence of RA   collapse and borderline RV collapse; howevere IVC collapses.   Discussed with DOD (Dr Lovena Le) who will arrange FU for pt;   previously seen by Dr Debara Pickett.   Recent Labs: 09/05/2018: BUN 22; Creatinine, Ser 0.98; Hemoglobin 11.3; Platelets  239; Potassium 4.1; Sodium 143  CBC    Component Value Date/Time   WBC 8.2 09/05/2018 1400   RBC 4.25 09/05/2018 1400   HGB 11.3 (L) 09/05/2018 1400   HCT 37.8 09/05/2018 1400   PLT 239 09/05/2018 1400   MCV 88.9 09/05/2018 1400   MCH 26.6 09/05/2018 1400   MCHC 29.9 (L) 09/05/2018 1400   RDW 13.9 09/05/2018 1400   LYMPHSABS 2.5 02/10/2012 1106   MONOABS 0.5 02/10/2012 1106   EOSABS 0.2 02/10/2012 1106   BASOSABS 0.0 02/10/2012 1106   CMP Latest Ref Rng & Units 09/05/2018 04/17/2018  Glucose 70 - 99 mg/dL 76 93  BUN 8 - 23 mg/dL 22 16  Creatinine 0.44 - 1.00 mg/dL 0.98 0.99  Sodium 135 - 145 mmol/L 143 137  Potassium 3.5 - 5.1 mmol/L 4.1 4.1  Chloride 98 - 111 mmol/L 109 101  CO2 22 - 32 mmol/L 25 27  Calcium 8.9 - 10.3 mg/dL 9.3 9.6     Lipid Panel No results found for: CHOL, HDL, LDLCALC, LDLDIRECT, TRIG, CHOLHDL    Wt Readings from Last 3 Encounters:  09/27/18 124 lb 3.2 oz (56.3 kg)  09/07/18 128 lb 6.4 oz (58.2 kg)  09/05/18 126 lb 9.6 oz (57.4 kg)     Other studies Reviewed: Additional studies/ records that were reviewed today include: office notes, hospital records and testing.  ASSESSMENT AND PLAN:  1.  LVD, abnormal echo: results reviewed w/ Dr Martinique  -With decreased EF and regional wall motion  abnormality, ischemic evaluation needed. - Cardiac catheterization is indicated. - The risks and benefits of a cardiac catheterization including, but not limited to, death, stroke, MI, kidney damage and bleeding were discussed with the patient who indicates understanding and agrees to proceed.  -We will get labs today, ECG is acceptable.  She is encouraged to get this done as soon as possible and she is agreeable to doing it 1 day next week.  No medications will need to be held except the losartan. -Add aspirin 81 mg daily, draw labs today.  2.  Second-degree AV block, type II, sinus bradycardia:  -No rate lowering medications -Unclear if symptoms are related to decreased heart rate or decreased EF - Follow-up on ZIO monitor results and let the patient know what was found. - Currently, no clear indication for pacemaker  3.  Left ventricular dysfunction: -No beta-blocker due to resting bradycardia -Minimal volume overload on exam and PAS only 35, will not add daily diuretic. -Patient to increase awareness of the amount of sodium in foods and let us know if she develops additional symptoms - Add low-dose losartan and see how tolerated   Current medicines are reviewed at length with the patient today.  The patient does not have concerns regarding medicines.  The following changes have been made: Add aspirin and losartan  Labs/ tests ordered today include:   Orders Placed This Encounter  Procedures  . CBC  . Basic metabolic panel     Disposition:   FU with Pixie Casino, MD  Signed, Rosaria Ferries, PA-C  09/27/2018 4:21 PM    [facility] Phone: [phone]; Fax: [phone]

## 2018-09-27 NOTE — Patient Instructions (Addendum)
Medication Instructions:  Start Losartan 25 mg daily. (hold the day of cath) Start Aspirin 81 mg daily. If you need a refill on your cardiac medications before your next appointment, please call your pharmacy.   Lab work: BMET, CBC today. If you have labs (blood work) drawn today and your tests are completely normal, you will receive your results only by: Marland Kitchen MyChart Message (if you have MyChart) OR . A paper copy in the mail If you have any lab test that is abnormal or we need to change your treatment, we will call you to review the results.  Testing/Procedures: Your physician has requested that you have a cardiac catheterization. Cardiac catheterization is used to diagnose and/or treat various heart conditions. Doctors Catarino recommend this procedure for a number of different reasons. The most common reason is to evaluate chest pain. Chest pain can be a symptom of coronary artery disease (CAD), and cardiac catheterization can show whether plaque is narrowing or blocking your heart's arteries. This procedure is also used to evaluate the valves, as well as measure the blood flow and oxygen levels in different parts of your heart. For further information please visit [email or web address]. Please follow instruction sheet, as given.  Follow-Up: At [facility], you and your health needs are our priority.  As part of our continuing mission to provide you with exceptional heart care, we have created designated Provider Care Teams.  These Care Teams include your primary Cardiologist (physician) and Advanced Practice Providers (APPs -  Physician Assistants and Nurse Practitioners) who all work together to provide you with the care you need, when you need it. Marland Kitchen Keep appointment with Dr.Hilty as scheduled.    Any Other Special Instructions Will Be Listed Below (If Applicable).        [facility] [address]  [place] [address] Dept: [phone] Loc: [facility]  09/27/2018  You are scheduled for a Cardiac Catheterization on Tuesday, January 7 with Dr. Shelva Majestic.  1. Please arrive at the [facility] (Main Entrance A) at [facility] [address] [place], [place] [address] at 10:00 AM (This time is two hours before your procedure to ensure your preparation). Free valet parking service is available.   Special note: Every effort is made to have your procedure done on time. Please understand that emergencies sometimes delay scheduled procedures.  2. Diet: Do not eat solid foods after midnight.  The patient Carneal have clear liquids until 5am upon the day of the procedure.  3. Labs: You will need to have blood drawn today.  4. Medication instructions in preparation for your procedure:   Contrast Allergy: No   Hold losartan the day of the Cath.  On the morning of your procedure, take your Aspirin and any morning medicines NOT listed above.  You Shane use sips of water.  5. Plan for one night stay--bring personal belongings. 6. Bring a current list of your medications and current insurance cards. 7. You MUST have a responsible person to drive you home. 8. Someone MUST be with you the first 24 hours after you arrive home or your discharge will be delayed. 9. Please wear clothes that are easy to get on and off and wear slip-on shoes.  Thank you for allowing [place] to care for you!   -- [facility]

## 2018-09-28 ENCOUNTER — Telehealth: Payer: Self-pay

## 2018-09-28 LAB — CBC
Hematocrit: 36.9 % (ref 34.0–46.6)
Hemoglobin: 11.6 g/dL (ref 11.1–15.9)
MCH: 26.3 pg — ABNORMAL LOW (ref 26.6–33.0)
MCHC: 31.4 g/dL — ABNORMAL LOW (ref 31.5–35.7)
MCV: 84 fL (ref 79–97)
Platelets: 328 10*3/uL (ref 150–450)
RBC: 4.41 x10E6/uL (ref 3.77–5.28)
RDW: 13.6 % (ref 12.3–15.4)
WBC: 7.1 10*3/uL (ref 3.4–10.8)

## 2018-09-28 LAB — BASIC METABOLIC PANEL
BUN/Creatinine Ratio: 17 (ref 12–28)
BUN: 16 mg/dL (ref 8–27)
CO2: 24 mmol/L (ref 20–29)
Calcium: 9.2 mg/dL (ref 8.7–10.3)
Chloride: 103 mmol/L (ref 96–106)
Creatinine, Ser: 0.94 mg/dL (ref 0.57–1.00)
GFR calc Af Amer: 67 mL/min/{1.73_m2} (ref >59)
GFR calc non Af Amer: 58 mL/min/{1.73_m2} — ABNORMAL LOW (ref >59)
Glucose: 87 mg/dL (ref 65–99)
Potassium: 4.5 mmol/L (ref 3.5–5.2)
Sodium: 143 mmol/L (ref 134–144)

## 2018-09-28 NOTE — Telephone Encounter (Signed)
Left detailed message per DPR:  Patient contacted pre-catheterization at [facility] scheduled for: 10/02/2018 at 1200 Verified arrival time and place:  Admitting at 1000  Confirmed AM meds to be taken pre-cath with sip of water: Hold losartan Monday and Tuesday --GFR 58  Take aspirin day of procedure  Confirmed patient has responsible person to drive home post procedure and observe patient for 24 hours:  Yes  Addl concerns:  All questions answered

## 2018-10-02 ENCOUNTER — Encounter (HOSPITAL_COMMUNITY)
Admission: RE | Disposition: A | Discharge status: Home / Self Care | Payer: Self-pay | Source: Home / Self Care | Attending: Cardiovascular Disease

## 2018-10-02 ENCOUNTER — Other Ambulatory Visit: Payer: Self-pay

## 2018-10-02 ENCOUNTER — Ambulatory Visit (HOSPITAL_COMMUNITY)
Admission: RE | Admit: 2018-10-02 | Discharge: 2018-10-02 | Disposition: A | Discharge status: Home / Self Care | Payer: Medicare Other | Attending: Cardiovascular Disease | Admitting: Cardiovascular Disease

## 2018-10-02 DIAGNOSIS — R079 Chest pain, unspecified: Secondary | ICD-10-CM | POA: Diagnosis not present

## 2018-10-02 DIAGNOSIS — Z8249 Family history of ischemic heart disease and other diseases of the circulatory system: Secondary | ICD-10-CM | POA: Insufficient documentation

## 2018-10-02 DIAGNOSIS — I25119 Atherosclerotic heart disease of native coronary artery with unspecified angina pectoris: Secondary | ICD-10-CM | POA: Diagnosis not present

## 2018-10-02 DIAGNOSIS — K589 Irritable bowel syndrome without diarrhea: Secondary | ICD-10-CM | POA: Diagnosis not present

## 2018-10-02 DIAGNOSIS — Z9851 Tubal ligation status: Secondary | ICD-10-CM | POA: Diagnosis not present

## 2018-10-02 DIAGNOSIS — I441 Atrioventricular block, second degree: Secondary | ICD-10-CM | POA: Insufficient documentation

## 2018-10-02 DIAGNOSIS — E785 Hyperlipidemia, unspecified: Secondary | ICD-10-CM | POA: Insufficient documentation

## 2018-10-02 DIAGNOSIS — I5030 Unspecified diastolic (congestive) heart failure: Secondary | ICD-10-CM | POA: Insufficient documentation

## 2018-10-02 DIAGNOSIS — M1990 Unspecified osteoarthritis, unspecified site: Secondary | ICD-10-CM | POA: Diagnosis not present

## 2018-10-02 DIAGNOSIS — I110 Hypertensive heart disease with heart failure: Secondary | ICD-10-CM | POA: Insufficient documentation

## 2018-10-02 DIAGNOSIS — I272 Pulmonary hypertension, unspecified: Secondary | ICD-10-CM | POA: Diagnosis not present

## 2018-10-02 DIAGNOSIS — R0609 Other forms of dyspnea: Secondary | ICD-10-CM | POA: Diagnosis not present

## 2018-10-02 DIAGNOSIS — K219 Gastro-esophageal reflux disease without esophagitis: Secondary | ICD-10-CM | POA: Insufficient documentation

## 2018-10-02 DIAGNOSIS — Z79899 Other long term (current) drug therapy: Secondary | ICD-10-CM | POA: Insufficient documentation

## 2018-10-02 DIAGNOSIS — G4733 Obstructive sleep apnea (adult) (pediatric): Secondary | ICD-10-CM | POA: Diagnosis not present

## 2018-10-02 HISTORY — PX: RIGHT/LEFT HEART CATH AND CORONARY ANGIOGRAPHY: CATH118266

## 2018-10-02 LAB — POCT I-STAT 3, VENOUS BLOOD GAS (G3P V)
ACID-BASE EXCESS: 1 mmol/L (ref 0.0–2.0)
Acid-Base Excess: 2 mmol/L (ref 0.0–2.0)
BICARBONATE: 27.6 mmol/L (ref 20.0–28.0)
Bicarbonate: 26.5 mmol/L (ref 20.0–28.0)
O2 Saturation: 62 %
O2 Saturation: 65 %
PCO2 VEN: 47.4 mmHg (ref 44.0–60.0)
TCO2: 29 mmol/L (ref 22–32)
TCO2: 28 mmol/L (ref 22–32)
pCO2, Ven: 45.6 mmHg (ref 44.0–60.0)
pH, Ven: 7.373 (ref 7.250–7.430)
pH, Ven: 7.372 (ref 7.250–7.430)
pO2, Ven: 33 mmHg (ref 32.0–45.0)
pO2, Ven: 35 mmHg (ref 32.0–45.0)

## 2018-10-02 LAB — POCT I-STAT 3, ART BLOOD GAS (G3+)
Acid-Base Excess: 1 mmol/L (ref 0.0–2.0)
BICARBONATE: 26.2 mmol/L (ref 20.0–28.0)
O2 SAT: 91 %
TCO2: 27 mmol/L (ref 22–32)
pCO2 arterial: 43.3 mmHg (ref 32.0–48.0)
pH, Arterial: 7.389 (ref 7.350–7.450)
pO2, Arterial: 62 mmHg — ABNORMAL LOW (ref 83.0–108.0)

## 2018-10-02 SURGERY — RIGHT/LEFT HEART CATH AND CORONARY ANGIOGRAPHY
Anesthesia: LOCAL

## 2018-10-02 MED ORDER — SODIUM CHLORIDE 0.9 % IV SOLN: 250.0000 mL | INTRAVENOUS | Status: DC | PRN

## 2018-10-02 MED ORDER — ONDANSETRON HCL 4 MG/2ML IJ SOLN: 4.0000 mg | Freq: Four times a day (QID) | INTRAMUSCULAR | Status: DC | PRN

## 2018-10-02 MED ORDER — MIDAZOLAM HCL 2 MG/2ML IJ SOLN
INTRAMUSCULAR | Status: AC
  Filled 2018-10-02: qty 2

## 2018-10-02 MED ORDER — ACETAMINOPHEN 325 MG PO TABS: 650.0000 mg | ORAL_TABLET | ORAL | Status: DC | PRN

## 2018-10-02 MED ORDER — LIDOCAINE HCL (PF) 1 % IJ SOLN
INTRAMUSCULAR | Status: AC
  Filled 2018-10-02: qty 30

## 2018-10-02 MED ORDER — HEPARIN (PORCINE) IN NACL 1000-0.9 UT/500ML-% IV SOLN
INTRAVENOUS | Status: AC
  Filled 2018-10-02: qty 1000

## 2018-10-02 MED ORDER — SODIUM CHLORIDE 0.9% FLUSH: 3.0000 mL | INTRAVENOUS | Status: DC | PRN

## 2018-10-02 MED ORDER — VERAPAMIL HCL 2.5 MG/ML IV SOLN
INTRAVENOUS | Status: AC
  Filled 2018-10-02: qty 2

## 2018-10-02 MED ORDER — IOHEXOL 350 MG/ML SOLN
INTRAVENOUS | Status: DC | PRN
  Administered 2018-10-02: 45 mL via INTRAVENOUS

## 2018-10-02 MED ORDER — SODIUM CHLORIDE 0.9% FLUSH: 3.0000 mL | Freq: Two times a day (BID) | INTRAVENOUS | Status: DC

## 2018-10-02 MED ORDER — HEPARIN SODIUM (PORCINE) 1000 UNIT/ML IJ SOLN
INTRAMUSCULAR | Status: DC | PRN
  Administered 2018-10-02: 3000 [IU] via INTRAVENOUS

## 2018-10-02 MED ORDER — HEPARIN SODIUM (PORCINE) 1000 UNIT/ML IJ SOLN
INTRAMUSCULAR | Status: AC
  Filled 2018-10-02: qty 1

## 2018-10-02 MED ORDER — SODIUM CHLORIDE 0.9 % WEIGHT BASED INFUSION
3.0000 mL/kg/h | INTRAVENOUS | Status: AC
  Administered 2018-10-02: 3 mL/kg/h via INTRAVENOUS

## 2018-10-02 MED ORDER — SODIUM CHLORIDE 0.9 % WEIGHT BASED INFUSION: 1.0000 mL/kg/h | INTRAVENOUS | Status: DC

## 2018-10-02 MED ORDER — HEPARIN (PORCINE) IN NACL 1000-0.9 UT/500ML-% IV SOLN
INTRAVENOUS | Status: DC | PRN
  Administered 2018-10-02 (×2): 500 mL

## 2018-10-02 MED ORDER — ASPIRIN 81 MG PO CHEW: 81.0000 mg | CHEWABLE_TABLET | ORAL | Status: DC

## 2018-10-02 MED ORDER — SODIUM CHLORIDE 0.9 % IV SOLN: INTRAVENOUS | Status: DC

## 2018-10-02 MED ORDER — MIDAZOLAM HCL 2 MG/2ML IJ SOLN
INTRAMUSCULAR | Status: DC | PRN
  Administered 2018-10-02: 1 mg via INTRAVENOUS

## 2018-10-02 MED ORDER — FENTANYL CITRATE (PF) 100 MCG/2ML IJ SOLN
INTRAMUSCULAR | Status: DC | PRN
  Administered 2018-10-02: 25 ug via INTRAVENOUS

## 2018-10-02 MED ORDER — DIAZEPAM 5 MG PO TABS: 5.0000 mg | ORAL_TABLET | Freq: Four times a day (QID) | ORAL | Status: DC | PRN

## 2018-10-02 MED ORDER — LIDOCAINE HCL (PF) 1 % IJ SOLN
INTRAMUSCULAR | Status: DC | PRN
  Administered 2018-10-02: 5 mL

## 2018-10-02 MED ORDER — VERAPAMIL HCL 2.5 MG/ML IV SOLN
INTRAVENOUS | Status: DC | PRN
  Administered 2018-10-02: 10 mL via INTRA_ARTERIAL

## 2018-10-02 MED ORDER — FENTANYL CITRATE (PF) 100 MCG/2ML IJ SOLN
INTRAMUSCULAR | Status: AC
  Filled 2018-10-02: qty 2

## 2018-10-02 SURGICAL SUPPLY — 14 items
CATH BALLN WEDGE 5F 110CM (CATHETERS) ×1 IMPLANT
CATH INFINITI 5FR ANG PIGTAIL (CATHETERS) ×1 IMPLANT
CATH OPTITORQUE TIG 4.0 5F (CATHETERS) ×1 IMPLANT
DEVICE RAD COMP TR BAND LRG (VASCULAR PRODUCTS) ×1 IMPLANT
GLIDESHEATH SLEND SS 6F .021 (SHEATH) ×1 IMPLANT
GUIDEWIRE INQWIRE 1.5J.035X260 (WIRE) IMPLANT
INQWIRE 1.5J .035X260CM (WIRE) ×2
KIT HEART LEFT (KITS) ×2 IMPLANT
PACK CARDIAC CATHETERIZATION (CUSTOM PROCEDURE TRAY) ×2 IMPLANT
SHEATH GLIDE SLENDER 4/5FR (SHEATH) ×1 IMPLANT
SYR MEDRAD MARK 7 150ML (SYRINGE) ×2 IMPLANT
TRANSDUCER W/STOPCOCK (MISCELLANEOUS) ×2 IMPLANT
TUBING CIL FLEX 10 FLL-RA (TUBING) ×2 IMPLANT
WIRE HI TORQ VERSACORE-J 145CM (WIRE) ×1 IMPLANT

## 2018-10-02 NOTE — Discharge Instructions (Signed)
Drink plenty of fluids over next 48 hours and keep right wrist elevated at heart level for 24 hours  Radial Site Care  This sheet gives you information about how to care for yourself after your procedure. Your health care provider Estell also give you more specific instructions. If you have problems or questions, contact your health care provider. What can I expect after the procedure? After the procedure, it is common to have:  Bruising and tenderness at the catheter insertion area. Follow these instructions at home: Medicines  Take over-the-counter and prescription medicines only as told by your health care provider. Insertion site care  Follow instructions from your health care provider about how to take care of your insertion site. Make sure you: ? Wash your hands with soap and water before you change your bandage (dressing). If soap and water are not available, use hand sanitizer. ? Change your dressing as told by your health care provider. ? Leave stitches (sutures), skin glue, or adhesive strips in place. These skin closures Defino need to stay in place for 2 weeks or longer. If adhesive strip edges start to loosen and curl up, you Enzor trim the loose edges. Do not remove adhesive strips completely unless your health care provider tells you to do that.  Check your insertion site every day for signs of infection. Check for: ? Redness, swelling, or pain. ? Fluid or blood. ? Pus or a bad smell. ? Warmth.  Do not take baths, swim, or use a hot tub until your health care provider approves.  You [place] shower 24-48 hours after the procedure, or as directed by your health care provider. ? Remove the dressing and gently wash the site with plain soap and water. ? Pat the area dry with a clean towel. ? Do not rub the site. That could cause bleeding.  Do not apply powder or lotion to the site. Activity   For 24 hours after the procedure, or as directed by your health care provider: ? Do not  flex or bend the affected arm. ? Do not push or pull heavy objects with the affected arm. ? Do not drive yourself home from the hospital or clinic. You [place] drive 24 hours after the procedure unless your health care provider tells you not to. ? Do not operate machinery or power tools.  Do not lift anything that is heavier than 10 lb (4.5 kg), or the limit that you are told, until your health care provider says that it is safe.  Ask your health care provider when it is okay to: ? Return to work or school. ? Resume usual physical activities or sports. ? Resume sexual activity. General instructions  If the catheter site starts to bleed, raise your arm and put firm pressure on the site. If the bleeding does not stop, get help right away. This is a medical emergency.  If you went home on the same day as your procedure, a responsible adult should be with you for the first 24 hours after you arrive home.  Keep all follow-up visits as told by your health care provider. This is important. Contact a health care provider if:  You have a fever.  You have redness, swelling, or yellow drainage around your insertion site. Get help right away if:  You have unusual pain at the radial site.  The catheter insertion area swells very fast.  The insertion area is bleeding, and the bleeding does not stop when you hold steady pressure on  the area.  Your arm or hand becomes pale, cool, tingly, or numb. These symptoms Hicks represent a serious problem that is an emergency. Do not wait to see if the symptoms will go away. Get medical help right away. Call your local emergency services (911 in the [place] Do not drive yourself to the hospital. Summary  After the procedure, it is common to have bruising and tenderness at the site.  Follow instructions from your health care provider about how to take care of your radial site wound. Check the wound every day for signs of infection.  Do not lift anything that is  heavier than 10 lb (4.5 kg), or the limit that you are told, until your health care provider says that it is safe. This information is not intended to replace advice given to you by your health care provider. Make sure you discuss any questions you have with your health care provider. Document Released: 10/15/2010 Document Revised: 10/18/2017 Document Reviewed: 10/18/2017 Elsevier Interactive Patient Education  2019 [employer].

## 2018-10-02 NOTE — Interval H&P Note (Signed)
Cath Lab Visit (complete for each Cath Lab visit)  Clinical Evaluation Leading to the Procedure:   ACS: No.  Non-ACS:    Anginal Classification: CCS II  Anti-ischemic medical therapy: No Therapy  Non-Invasive Test Results: No non-invasive testing performed  Prior CABG: No previous CABG      History and Physical Interval Note:  10/02/2018 1:28 PM  Erika [name]  has presented today for surgery, with the diagnosis of Abnorm echo  The various methods of treatment have been discussed with the patient and family. After consideration of risks, benefits and other options for treatment, the patient has consented to  Procedure(s): RIGHT/LEFT HEART CATH AND CORONARY ANGIOGRAPHY (N/A) as a surgical intervention .  The patient's history has been reviewed, patient examined, no change in status, stable for surgery.  I have reviewed the patient's chart and labs.  Questions were answered to the patient's satisfaction.     Shelva Majestic

## 2018-10-03 ENCOUNTER — Encounter (HOSPITAL_COMMUNITY): Payer: Self-pay | Admitting: Cardiovascular Disease

## 2018-10-03 DIAGNOSIS — Z01810 Encounter for preprocedural cardiovascular examination: Secondary | ICD-10-CM | POA: Diagnosis not present

## 2018-10-03 DIAGNOSIS — I441 Atrioventricular block, second degree: Secondary | ICD-10-CM | POA: Diagnosis not present

## 2018-10-10 ENCOUNTER — Ambulatory Visit: Payer: Self-pay | Admitting: General Surgery

## 2018-10-17 ENCOUNTER — Ambulatory Visit: Payer: Medicare Other | Admitting: Internal Medicine

## 2018-10-22 ENCOUNTER — Ambulatory Visit: Payer: Self-pay | Admitting: General Surgery

## 2018-10-22 ENCOUNTER — Encounter (HOSPITAL_COMMUNITY): Payer: Self-pay

## 2018-10-22 NOTE — Patient Instructions (Signed)
Erika [name]  12-01-1940    Your procedure is scheduled on:  10-29-2018   Report to [facility] Main  Entrance,  Report to admitting at  5:30 AM    Call this number if you have problems the morning of surgery [phone]       Remember: Do not eat food or drink liquids :After Midnight.                                       BRUSH YOUR TEETH MORNING OF SURGERY AND RINSE YOUR MOUTH OUT, NO CHEWING GUM CANDY OR MINTS.        Take these medicines the morning of surgery with A SIP OF WATER:   Omeprazole                                   You [place] not have any metal on your body including hair pins and               piercings  Do not wear jewelry, make-up, lotions, powders or perfumes, deodorant              Do not wear nail polish.  Do not shave  48 hours prior to surgery.                    Do not bring valuables to the hospital. [place].  Contacts, dentures or bridgework [place] not be worn into surgery.  Leave suitcase in the car. After surgery it Sugg be brought to your room.            _____________________________________________________________________            [facility] - Preparing for Surgery Before surgery, you can play an important role.  Because skin is not sterile, your skin needs to be as free of germs as possible.  You can reduce the number of germs on your skin by washing with CHG (chlorahexidine gluconate) soap before surgery.  CHG is an antiseptic cleaner which kills germs and bonds with the skin to continue killing germs even after washing. Please DO NOT use if you have an allergy to CHG or antibacterial soaps.  If your skin becomes reddened/irritated stop using the CHG and inform your nurse when you arrive at Short Stay. Do not shave (including legs and underarms) for at least 48 hours prior to the first CHG shower.  You Mower shave your face/neck. Please follow these instructions  carefully:  1.  Shower with CHG Soap the night before surgery and the  morning of Surgery.  2.  If you choose to wash your hair, wash your hair first as usual with your  normal  shampoo.  3.  After you shampoo, rinse your hair and body thoroughly to remove the  shampoo.                            4.  Use CHG as you would any other liquid soap.  You can apply chg directly  to the skin and wash  Gently with a scrungie or clean washcloth.  5.  Apply the CHG Soap to your body ONLY FROM THE NECK DOWN.   Do not use on face/ open                           Wound or open sores. Avoid contact with eyes, ears mouth and genitals (private parts).                       Wash face,  Genitals (private parts) with your normal soap.             6.  Wash thoroughly, paying special attention to the area where your surgery  will be performed.  7.  Thoroughly rinse your body with warm water from the neck down.  8.  DO NOT shower/wash with your normal soap after using and rinsing off  the CHG Soap.             9.  Pat yourself dry with a clean towel.            10.  Wear clean pajamas.            11.  Place clean sheets on your bed the night of your first shower and do not  sleep with pets. Day of Surgery : Do not apply any lotions/deodorants the morning of surgery.  Please wear clean clothes to the hospital/surgery center.  FAILURE TO FOLLOW THESE INSTRUCTIONS [place] RESULT IN THE CANCELLATION OF YOUR SURGERY PATIENT SIGNATURE_________________________________  NURSE SIGNATURE__________________________________  ________________________________________________________________________

## 2018-10-23 ENCOUNTER — Other Ambulatory Visit: Payer: Self-pay

## 2018-10-23 ENCOUNTER — Encounter (HOSPITAL_COMMUNITY)
Admission: RE | Admit: 2018-10-23 | Discharge: 2018-10-23 | Disposition: A | Discharge status: Home / Self Care | Payer: Medicare Other | Source: Ambulatory Visit | Attending: General Surgery | Admitting: General Surgery

## 2018-10-23 ENCOUNTER — Encounter (HOSPITAL_COMMUNITY): Payer: Self-pay

## 2018-10-23 DIAGNOSIS — K449 Diaphragmatic hernia without obstruction or gangrene: Secondary | ICD-10-CM | POA: Insufficient documentation

## 2018-10-23 DIAGNOSIS — Z01812 Encounter for preprocedural laboratory examination: Secondary | ICD-10-CM | POA: Insufficient documentation

## 2018-10-23 DIAGNOSIS — K219 Gastro-esophageal reflux disease without esophagitis: Secondary | ICD-10-CM | POA: Diagnosis not present

## 2018-10-23 HISTORY — DX: Supraventricular tachycardia: I47.1

## 2018-10-23 HISTORY — DX: Presence of spectacles and contact lenses: Z97.3

## 2018-10-23 HISTORY — DX: Personal history of colon polyps, unspecified: Z86.0100

## 2018-10-23 HISTORY — DX: Atrioventricular block, second degree: I44.1

## 2018-10-23 HISTORY — DX: Supraventricular tachycardia, unspecified: I47.10

## 2018-10-23 HISTORY — DX: Personal history of colonic polyps: Z86.010

## 2018-10-23 HISTORY — DX: Obstructive sleep apnea (adult) (pediatric): G47.33

## 2018-10-23 HISTORY — DX: Personal history of other diseases of the digestive system: Z87.19

## 2018-10-23 LAB — BASIC METABOLIC PANEL
ANION GAP: 8 (ref 5–15)
BUN: 18 mg/dL (ref 8–23)
CO2: 26 mmol/L (ref 22–32)
Calcium: 9.3 mg/dL (ref 8.9–10.3)
Chloride: 104 mmol/L (ref 98–111)
Creatinine, Ser: 0.92 mg/dL (ref 0.44–1.00)
GFR calc non Af Amer: 59 mL/min — ABNORMAL LOW (ref >60)
Glucose, Bld: 80 mg/dL (ref 70–99)
Potassium: 4.5 mmol/L (ref 3.5–5.1)
Sodium: 138 mmol/L (ref 135–145)

## 2018-10-23 LAB — CBC
HCT: 39 % (ref 36.0–46.0)
Hemoglobin: 12.2 g/dL (ref 12.0–15.0)
MCH: 27.5 pg (ref 26.0–34.0)
MCHC: 31.3 g/dL (ref 30.0–36.0)
MCV: 87.8 fL (ref 80.0–100.0)
NRBC: 0 % (ref 0.0–0.2)
Platelets: 252 10*3/uL (ref 150–400)
RBC: 4.44 MIL/uL (ref 3.87–5.11)
RDW: 13.6 % (ref 11.5–15.5)
WBC: 6.6 10*3/uL (ref 4.0–10.5)

## 2018-10-23 MED ORDER — CHLORHEXIDINE GLUCONATE CLOTH 2 % EX PADS: 6.0000 | MEDICATED_PAD | Freq: Once | CUTANEOUS | Status: DC

## 2018-10-23 MED ORDER — CHLORHEXIDINE GLUCONATE CLOTH 2 % EX PADS
6.0000 | MEDICATED_PAD | Freq: Once | CUTANEOUS | Status: DC
  Filled 2018-10-23: qty 6

## 2018-10-23 NOTE — Progress Notes (Addendum)
EKG dated 09-07-2018 in epic. ECHO dated 09-24-2018 in epic. Event monitor results dated 09-24-2018 in epic. Cardiac Cath result dated 10-02-2018 in epic.  Telephone cardiac clearance, dr Debara Pickett, dated 10-04-2018 in chart and epic. [place] cardiologist note in epic dated 09-27-2018.

## 2018-10-24 ENCOUNTER — Other Ambulatory Visit (HOSPITAL_COMMUNITY): Payer: Self-pay | Admitting: *Deleted

## 2018-10-26 NOTE — Progress Notes (Signed)
Anesthesia Chart Review   Case:  [id number] Date/Time:  10/29/18 0715   Procedure:  LAPAROSCOPIC REPAIR OF HIATAL HERNIA WITH NISSEN FUNDOPLICATION (N/A )   Anesthesia type:  General   Pre-op diagnosis:  large hiatal hernia and GERD   Location:  [facility] ROOM 01 / [facility]   Surgeon:  Excell Seltzer, MD      DISCUSSION: 78 yo never smoker with h/o GERD, PSVT, HTN, OSA w/o CPAP use, large hiatal hernia scheduled for above procedure on 10/29/2018 with Dr. Excell Seltzer.   Surgery previously postponed until she could be evaluated by cardiology due to 2nd degree heart block on EKG.  Pt seen 09/08/19 by cardiology, Dr. Lyman Bishop.  Pt fitted with 3 day monitor and scheduled for Echo.  Echo 09/24/18 with EF 40-45%, stress test ordered.  Stress test 10/02/18, low risk study.  Per Dr. Debara Pickett 10/04/18 on results of long term monitor, "Acceptable risk for surgery from a cardiac standpoint."  Pt can proceed with planned procedure barring acute status change.  VS: BP (!) 150/79   Pulse 66   Temp 36.9 C (Oral)   Resp 12   Ht 5\' 3"  (1.6 m)   Wt 54.5 kg   SpO2 100%   BMI 21.28 kg/m   PROVIDERS: Hayden Rasmussen, MD is PCP   Lyman Bishop, MD is Cardiologist  LABS: Labs reviewed: Acceptable for surgery. (all labs ordered are listed, but only abnormal results are displayed)  Labs Reviewed  BASIC METABOLIC PANEL - Abnormal; Notable for the following components:      Result Value   GFR calc non Af Amer 59 (*)    All other components within normal limits  CBC     IMAGES: CT Abdomen 04/24/18 IMPRESSION: 1. No acute findings identified within the abdomen or pelvis. 2. No mass or adenopathy. 3. Prominent left gonadal and parametrial veins. Nonspecific but Call[place]ott be seen with pelvic venous insufficiency. Pelvic venous insufficiency Lax be associated with pelvic congestion syndrome that Brendlinger be an etiology for chronic pelvic pain. 4.  Aortic Atherosclerosis (ICD10-I70.0). 5. Large hiatal  hernia 6. Chronic L1 compression deformity.  EKG: 09/07/18 Rate 66 bpm Normal sinus rhythm  IVCD  CV: Cardiac Cath 10/02/18  Ost LAD to Prox LAD lesion is 10% stenosed.  The left ventricular ejection fraction is 50-55% by visual estimate.  LV end diastolic pressure is mildly elevated.   Normal right heart pressures.  No significant coronary obstructive disease with minimal 10% narrowing in the proximal LAD.  Low normal LV function with an EF of 50 to 55%.  RECOMMENDATION: Medical therapy. Echo 09/24/18 Study Conclusions  - Left ventricle: The cavity size was normal. Wall thickness was   increased in a pattern of mild LVH. Systolic function was mildly   to moderately reduced. The estimated ejection fraction was in the   range of 40% to 45%. There is hypokinesis of the anteroseptal   myocardium. Doppler parameters are consistent with abnormal left   ventricular relaxation (grade 1 diastolic dysfunction). Doppler   parameters are consistent with high ventricular filling pressure. - Mitral valve: Calcified annulus. There was mild regurgitation. - Left atrium: The atrium was mildly dilated. - Pulmonary arteries: Systolic pressure was mildly increased. PA   peak pressure: 35 mm Hg (S). - Pericardium, extracardiac: A moderate pericardial effusion was   identified. There was right atrial chamber collapse.  Impressions:  - Hypokinesis of the septum with overall mild to moderate LV   dysfunction; mild LVH;  mild diastolic dysfunction with elevated   LV filling pressure; mild MR; mild LAE; mild TR; mild pulmonary   hypertension; moderate pericardial effusion; evidence of RA   collapse and borderline RV collapse; howevere IVC collapses.   Discussed with DOD (Dr Lovena Le) who will arrange FU for pt;   previously seen by Dr Debara Pickett. Past Medical History:  Diagnosis Date  . Absence of appendix, congenital   . Arthritis   . GERD (gastroesophageal reflux disease)   . Hiatal  hernia   . History of colon polyps   . History of gastric polyp   . Hypercholesterolemia   . Hypertension    PER PT NO MEDICINE  . Irritable bowel syndrome    resolved last few years  . Mobitz type 2 second degree heart block    intermittant,  cardiac work-up done;  Cardiac cath 10-02-2018,  echo 09-24-2018, event monitor 09-24-2018 (all results in epic)  . Neck pain    right sided neck pain  . OSA (obstructive sleep apnea)    does not use c-pap did not like  . Osteoporosis 06/2017   T score -2.8  . PSVT (paroxysmal supraventricular tachycardia) ([place])    short run on event monitor 09-24-2018  . Wears glasses     Past Surgical History:  Procedure Laterality Date  . CATARACT EXTRACTION W/ INTRAOCULAR LENS  IMPLANT, BILATERAL    . COLONOSCOPY    . cortisone shots in back    . ESOPHAGOGASTRODUODENOSCOPY    . ESOPHAGOGASTRODUODENOSCOPY N/A 08/14/2013   Procedure: ESOPHAGOGASTRODUODENOSCOPY (EGD);  Surgeon: Jeryl Columbia, MD;  Location: [facility];  Service: Endoscopy;  Laterality: N/A;  . ESOPHAGOGASTRODUODENOSCOPY (EGD) WITH PROPOFOL N/A 09/29/2015   Procedure: ESOPHAGOGASTRODUODENOSCOPY (EGD) WITH PROPOFOL;  Surgeon: Clarene Essex, MD;  [facility];  Service: Endoscopy;  Laterality: N/A;  . FOOT SURGERY Right 06-30-2005   dr duda   osteotomy first and second toe's  . HOT HEMOSTASIS N/A 08/14/2013   Procedure: HOT HEMOSTASIS (ARGON PLASMA COAGULATION/BICAP);  Surgeon: Jeryl Columbia, MD;  Location: [facility];  Service: Endoscopy;  Laterality: N/A;  . HOT HEMOSTASIS N/A 09/29/2015   Procedure: HOT HEMOSTASIS (ARGON PLASMA COAGULATION/BICAP);  Surgeon: Clarene Essex, MD;  Location: [facility];  Service: Endoscopy;  Laterality: N/A;  . RIGHT/LEFT HEART CATH AND CORONARY ANGIOGRAPHY N/A 10/02/2018   Procedure: RIGHT/LEFT HEART CATH AND CORONARY ANGIOGRAPHY;  Surgeon: Troy Sine, MD;  Location: [facility];  Service: Cardiovascular;  Laterality: N/A;  . TUBAL LIGATION   yrs ago    MEDICATIONS: . alendronate (FOSAMAX) 70 MG tablet  . aspirin 81 MG tablet  . Calcium Carb-Cholecalciferol (CALCIUM 600+D) 600-800 MG-UNIT TABS  . carbonyl iron (FEOSOL) 45 MG TABS tablet  . Cyanocobalamin (VITAMIN B 12 PO)  . losartan (COZAAR) 25 MG tablet  . omeprazole (PRILOSEC) 40 MG capsule  . Probiotic Product (HEALTHY COLON PO)   No current facility-administered medications for this encounter.     Maia Plan [facility] Pre-Surgical Testing [phone] 10/26/18 1:47 PM

## 2018-10-26 NOTE — Anesthesia Preprocedure Evaluation (Addendum)
Anesthesia Evaluation  Patient identified by MRN, date of birth, ID band Patient awake    Reviewed: Allergy & Precautions, NPO status , Patient's Chart, lab work & pertinent test results  Airway Mallampati: II  TM Distance: <3 FB Neck ROM: Full    Dental no notable dental hx.    Pulmonary sleep apnea ,    Pulmonary exam normal breath sounds clear to auscultation       Cardiovascular hypertension, Normal cardiovascular exam Rhythm:Regular Rate:Normal     Neuro/Psych negative neurological ROS  negative psych ROS   GI/Hepatic Neg liver ROS, hiatal hernia, GERD  ,  Endo/Other  negative endocrine ROS  Renal/GU negative Renal ROS  negative genitourinary   Musculoskeletal negative musculoskeletal ROS (+)   Abdominal   Peds negative pediatric ROS (+)  Hematology negative hematology ROS (+)   Anesthesia Other Findings   Reproductive/Obstetrics negative OB ROS                            Anesthesia Physical Anesthesia Plan  ASA: III  Anesthesia Plan: General   Post-op Pain Management:    Induction: Intravenous  PONV Risk Score and Plan: 3 and Ondansetron, Dexamethasone and Treatment Geller vary due to age or medical condition  Airway Management Planned: Oral ETT  Additional Equipment:   Intra-op Plan:   Post-operative Plan: Extubation in OR  Informed Consent: I have reviewed the patients History and Physical, chart, labs and discussed the procedure including the risks, benefits and alternatives for the proposed anesthesia with the patient or authorized representative who has indicated his/her understanding and acceptance.     Dental advisory given  Plan Discussed with: CRNA and Surgeon  Anesthesia Plan Comments: (See PST note 10/23/18, Konrad Felix, PA-C)       Anesthesia Quick Evaluation

## 2018-10-28 MED ORDER — BUPIVACAINE LIPOSOME 1.3 % IJ SUSP
20.0000 mL | Freq: Once | INTRAMUSCULAR | Status: DC
  Filled 2018-10-28: qty 20

## 2018-10-29 ENCOUNTER — Ambulatory Visit (HOSPITAL_COMMUNITY): Payer: Medicare Other | Admitting: Physician Assistant

## 2018-10-29 ENCOUNTER — Ambulatory Visit (HOSPITAL_COMMUNITY): Payer: Medicare Other | Admitting: Anesthesiology

## 2018-10-29 ENCOUNTER — Other Ambulatory Visit: Payer: Self-pay

## 2018-10-29 ENCOUNTER — Encounter (HOSPITAL_COMMUNITY)
Admission: RE | Disposition: A | Discharge status: Home / Self Care | Payer: Self-pay | Source: Home / Self Care | Attending: General Surgery

## 2018-10-29 ENCOUNTER — Encounter (HOSPITAL_COMMUNITY): Payer: Self-pay

## 2018-10-29 ENCOUNTER — Inpatient Hospital Stay (HOSPITAL_COMMUNITY)
Admission: RE | Admit: 2018-10-29 | Discharge: 2018-11-02 | DRG: 328 | Disposition: A | Discharge status: Home / Self Care | Payer: Medicare Other | Attending: General Surgery | Admitting: General Surgery

## 2018-10-29 DIAGNOSIS — G4730 Sleep apnea, unspecified: Secondary | ICD-10-CM | POA: Diagnosis not present

## 2018-10-29 DIAGNOSIS — I10 Essential (primary) hypertension: Secondary | ICD-10-CM | POA: Diagnosis not present

## 2018-10-29 DIAGNOSIS — E785 Hyperlipidemia, unspecified: Secondary | ICD-10-CM | POA: Diagnosis not present

## 2018-10-29 DIAGNOSIS — G8918 Other acute postprocedural pain: Secondary | ICD-10-CM

## 2018-10-29 DIAGNOSIS — M1990 Unspecified osteoarthritis, unspecified site: Secondary | ICD-10-CM | POA: Diagnosis present

## 2018-10-29 DIAGNOSIS — K219 Gastro-esophageal reflux disease without esophagitis: Secondary | ICD-10-CM | POA: Diagnosis not present

## 2018-10-29 DIAGNOSIS — K440 Diaphragmatic hernia with obstruction, without gangrene: Principal | ICD-10-CM | POA: Diagnosis present

## 2018-10-29 DIAGNOSIS — R109 Unspecified abdominal pain: Secondary | ICD-10-CM

## 2018-10-29 DIAGNOSIS — K449 Diaphragmatic hernia without obstruction or gangrene: Secondary | ICD-10-CM | POA: Diagnosis not present

## 2018-10-29 HISTORY — PX: HIATAL HERNIA REPAIR: SHX195

## 2018-10-29 SURGERY — REPAIR, HERNIA, HIATAL, LAPAROSCOPIC
Anesthesia: General | Site: Abdomen

## 2018-10-29 MED ORDER — BUPIVACAINE-EPINEPHRINE (PF) 0.5% -1:200000 IJ SOLN
INTRAMUSCULAR | Status: AC
  Filled 2018-10-29: qty 30

## 2018-10-29 MED ORDER — SODIUM CHLORIDE (PF) 0.9 % IJ SOLN
INTRAMUSCULAR | Status: AC
  Filled 2018-10-29: qty 50

## 2018-10-29 MED ORDER — DEXAMETHASONE SODIUM PHOSPHATE 10 MG/ML IJ SOLN
INTRAMUSCULAR | Status: DC | PRN
  Administered 2018-10-29: 4 mg via INTRAVENOUS

## 2018-10-29 MED ORDER — ACETAMINOPHEN 500 MG PO TABS
1000.0000 mg | ORAL_TABLET | ORAL | Status: AC
  Administered 2018-10-29: 1000 mg via ORAL
  Filled 2018-10-29: qty 2

## 2018-10-29 MED ORDER — LACTATED RINGERS IV SOLN
INTRAVENOUS | Status: DC
  Administered 2018-10-29 (×2): via INTRAVENOUS

## 2018-10-29 MED ORDER — LACTATED RINGERS IR SOLN
Status: DC | PRN
  Administered 2018-10-29: 1000 mL

## 2018-10-29 MED ORDER — CEFAZOLIN SODIUM-DEXTROSE 2-4 GM/100ML-% IV SOLN
2.0000 g | INTRAVENOUS | Status: AC
  Administered 2018-10-29: 2 g via INTRAVENOUS
  Filled 2018-10-29: qty 100

## 2018-10-29 MED ORDER — LIDOCAINE HCL 2 % IJ SOLN
INTRAMUSCULAR | Status: AC
  Filled 2018-10-29: qty 20

## 2018-10-29 MED ORDER — ROCURONIUM BROMIDE 100 MG/10ML IV SOLN
INTRAVENOUS | Status: AC
  Filled 2018-10-29: qty 1

## 2018-10-29 MED ORDER — BUPIVACAINE-EPINEPHRINE (PF) 0.5% -1:200000 IJ SOLN
INTRAMUSCULAR | Status: AC
  Filled 2018-10-29: qty 1.8

## 2018-10-29 MED ORDER — HYDROMORPHONE HCL 1 MG/ML IJ SOLN: 0.2500 mg | INTRAMUSCULAR | Status: DC | PRN

## 2018-10-29 MED ORDER — 0.9 % SODIUM CHLORIDE (POUR BTL) OPTIME
TOPICAL | Status: DC | PRN
  Administered 2018-10-29: 1000 mL

## 2018-10-29 MED ORDER — KETAMINE HCL 10 MG/ML IJ SOLN
INTRAMUSCULAR | Status: AC
  Filled 2018-10-29: qty 1

## 2018-10-29 MED ORDER — PROMETHAZINE HCL 25 MG/ML IJ SOLN: 6.2500 mg | INTRAMUSCULAR | Status: DC | PRN

## 2018-10-29 MED ORDER — LIDOCAINE 20MG/ML (2%) 15 ML SYRINGE OPTIME
INTRAMUSCULAR | Status: DC | PRN
  Administered 2018-10-29: 1 mg/kg/h via INTRAVENOUS

## 2018-10-29 MED ORDER — BUPIVACAINE HCL (PF) 0.25 % IJ SOLN
INTRAMUSCULAR | Status: AC
  Filled 2018-10-29: qty 30

## 2018-10-29 MED ORDER — ONDANSETRON HCL 4 MG/2ML IJ SOLN
INTRAMUSCULAR | Status: AC
  Filled 2018-10-29: qty 2

## 2018-10-29 MED ORDER — SUGAMMADEX SODIUM 200 MG/2ML IV SOLN
INTRAVENOUS | Status: AC
  Filled 2018-10-29: qty 2

## 2018-10-29 MED ORDER — MORPHINE SULFATE (PF) 2 MG/ML IV SOLN: 2.0000 mg | INTRAVENOUS | Status: DC | PRN

## 2018-10-29 MED ORDER — PANTOPRAZOLE SODIUM 40 MG IV SOLR
40.0000 mg | Freq: Every day | INTRAVENOUS | Status: DC
  Administered 2018-10-29 – 2018-11-01 (×4): 40 mg via INTRAVENOUS
  Filled 2018-10-29 (×4): qty 40

## 2018-10-29 MED ORDER — POTASSIUM CHLORIDE IN NACL 20-0.9 MEQ/L-% IV SOLN
INTRAVENOUS | Status: DC
  Administered 2018-10-29 – 2018-11-01 (×5): via INTRAVENOUS
  Filled 2018-10-29 (×6): qty 1000

## 2018-10-29 MED ORDER — MIDAZOLAM HCL 5 MG/5ML IJ SOLN
INTRAMUSCULAR | Status: DC | PRN
  Administered 2018-10-29 (×2): 1 mg via INTRAVENOUS

## 2018-10-29 MED ORDER — BUPIVACAINE LIPOSOME 1.3 % IJ SUSP
INTRAMUSCULAR | Status: DC | PRN
  Administered 2018-10-29: 20 mL

## 2018-10-29 MED ORDER — OXYCODONE HCL 5 MG PO TABS
5.0000 mg | ORAL_TABLET | ORAL | Status: DC | PRN
  Administered 2018-10-30 (×3): 5 mg via ORAL
  Administered 2018-10-31: 10 mg via ORAL
  Administered 2018-10-31 (×2): 5 mg via ORAL
  Administered 2018-11-01 (×2): 10 mg via ORAL
  Administered 2018-11-01: 5 mg via ORAL
  Filled 2018-10-29 (×4): qty 1
  Filled 2018-10-29: qty 2
  Filled 2018-10-29: qty 1
  Filled 2018-10-29 (×2): qty 2
  Filled 2018-10-29: qty 1

## 2018-10-29 MED ORDER — FENTANYL CITRATE (PF) 250 MCG/5ML IJ SOLN
INTRAMUSCULAR | Status: AC
  Filled 2018-10-29: qty 5

## 2018-10-29 MED ORDER — MIDAZOLAM HCL 2 MG/2ML IJ SOLN
INTRAMUSCULAR | Status: AC
  Filled 2018-10-29: qty 2

## 2018-10-29 MED ORDER — ESMOLOL HCL 100 MG/10ML IV SOLN
INTRAVENOUS | Status: DC | PRN
  Administered 2018-10-29: 15 mg via INTRAVENOUS

## 2018-10-29 MED ORDER — SODIUM CHLORIDE (PF) 0.9 % IJ SOLN
INTRAMUSCULAR | Status: DC | PRN
  Administered 2018-10-29: 50 mL

## 2018-10-29 MED ORDER — ENOXAPARIN SODIUM 40 MG/0.4ML ~~LOC~~ SOLN
40.0000 mg | SUBCUTANEOUS | Status: DC
  Administered 2018-10-30 – 2018-11-02 (×4): 40 mg via SUBCUTANEOUS
  Filled 2018-10-29 (×4): qty 0.4

## 2018-10-29 MED ORDER — PROPOFOL 10 MG/ML IV BOLUS
INTRAVENOUS | Status: AC
  Filled 2018-10-29: qty 20

## 2018-10-29 MED ORDER — ONDANSETRON 4 MG PO TBDP: 4.0000 mg | ORAL_TABLET | Freq: Four times a day (QID) | ORAL | Status: DC | PRN

## 2018-10-29 MED ORDER — ONDANSETRON HCL 4 MG/2ML IJ SOLN
4.0000 mg | Freq: Four times a day (QID) | INTRAMUSCULAR | Status: DC | PRN
  Administered 2018-10-29: 4 mg via INTRAVENOUS
  Filled 2018-10-29: qty 2

## 2018-10-29 MED ORDER — PROPOFOL 10 MG/ML IV BOLUS
INTRAVENOUS | Status: DC | PRN
  Administered 2018-10-29: 20 mg via INTRAVENOUS
  Administered 2018-10-29: 110 mg via INTRAVENOUS

## 2018-10-29 MED ORDER — KETAMINE HCL 10 MG/ML IJ SOLN
INTRAMUSCULAR | Status: DC | PRN
  Administered 2018-10-29: 26 mg via INTRAVENOUS

## 2018-10-29 MED ORDER — EVICEL 5 ML EX KIT
PACK | Freq: Once | CUTANEOUS | Status: AC
  Administered 2018-10-29: 1
  Filled 2018-10-29: qty 1

## 2018-10-29 MED ORDER — BUPIVACAINE-EPINEPHRINE (PF) 0.5% -1:200000 IJ SOLN
INTRAMUSCULAR | Status: DC | PRN
  Administered 2018-10-29: 30 mL

## 2018-10-29 MED ORDER — LIDOCAINE HCL (CARDIAC) PF 100 MG/5ML IV SOSY
PREFILLED_SYRINGE | INTRAVENOUS | Status: DC | PRN
  Administered 2018-10-29: 30 mg via INTRAVENOUS

## 2018-10-29 MED ORDER — FENTANYL CITRATE (PF) 100 MCG/2ML IJ SOLN
INTRAMUSCULAR | Status: DC | PRN
  Administered 2018-10-29: 25 ug via INTRAVENOUS
  Administered 2018-10-29 (×3): 50 ug via INTRAVENOUS
  Administered 2018-10-29: 25 ug via INTRAVENOUS
  Administered 2018-10-29 (×5): 50 ug via INTRAVENOUS

## 2018-10-29 MED ORDER — ROCURONIUM BROMIDE 100 MG/10ML IV SOLN
INTRAVENOUS | Status: DC | PRN
  Administered 2018-10-29: 70 mg via INTRAVENOUS
  Administered 2018-10-29: 20 mg via INTRAVENOUS

## 2018-10-29 MED ORDER — GABAPENTIN 300 MG PO CAPS
300.0000 mg | ORAL_CAPSULE | ORAL | Status: AC
  Administered 2018-10-29: 300 mg via ORAL
  Filled 2018-10-29: qty 1

## 2018-10-29 MED ORDER — LIDOCAINE 2% (20 MG/ML) 5 ML SYRINGE
INTRAMUSCULAR | Status: AC
  Filled 2018-10-29: qty 5

## 2018-10-29 MED ORDER — EPHEDRINE SULFATE 50 MG/ML IJ SOLN
INTRAMUSCULAR | Status: DC | PRN
  Administered 2018-10-29 (×2): 10 mg via INTRAVENOUS

## 2018-10-29 MED ORDER — SUGAMMADEX SODIUM 200 MG/2ML IV SOLN
INTRAVENOUS | Status: DC | PRN
  Administered 2018-10-29: 200 mg via INTRAVENOUS

## 2018-10-29 MED ORDER — ONDANSETRON HCL 4 MG/2ML IJ SOLN
INTRAMUSCULAR | Status: DC | PRN
  Administered 2018-10-29 (×2): 4 mg via INTRAVENOUS

## 2018-10-29 SURGICAL SUPPLY — 54 items
ADH SKN CLS APL DERMABOND .7 (GAUZE/BANDAGES/DRESSINGS) ×1
APPLIER CLIP ROT 10 11.4 M/L (STAPLE)
APR CLP MED LRG 11.4X10 (STAPLE)
CABLE HIGH FREQUENCY MONO STRZ (ELECTRODE) ×1 IMPLANT
CLIP APPLIE ROT 10 11.4 M/L (STAPLE) IMPLANT
COVER SURGICAL LIGHT HANDLE (MISCELLANEOUS) ×3 IMPLANT
COVER WAND RF STERILE (DRAPES) ×2 IMPLANT
DECANTER SPIKE VIAL GLASS SM (MISCELLANEOUS) ×5 IMPLANT
DERMABOND ADVANCED (GAUZE/BANDAGES/DRESSINGS) ×2
DERMABOND ADVANCED .7 DNX12 (GAUZE/BANDAGES/DRESSINGS) ×1 IMPLANT
DEVICE SUT QUICK LOAD TK 5 (STAPLE) ×8 IMPLANT
DEVICE SUT TI-KNOT TK 5X26 (MISCELLANEOUS) ×2 IMPLANT
DEVICE SUTURE ENDOST 10MM (ENDOMECHANICALS) ×3 IMPLANT
DEVICE TI KNOT TK5 (MISCELLANEOUS) ×1
DISSECTOR BLUNT TIP ENDO 5MM (MISCELLANEOUS) ×3 IMPLANT
DRAIN PENROSE 18X1/2 LTX STRL (DRAIN) ×3 IMPLANT
ELECT PENCIL ROCKER SW 15FT (MISCELLANEOUS) ×2 IMPLANT
ELECT REM PT RETURN 15FT ADLT (MISCELLANEOUS) ×3 IMPLANT
GLOVE BIOGEL PI IND STRL 6.5 (GLOVE) IMPLANT
GLOVE BIOGEL PI IND STRL 7.0 (GLOVE) IMPLANT
GLOVE BIOGEL PI IND STRL 7.5 (GLOVE) ×1 IMPLANT
GLOVE BIOGEL PI INDICATOR 6.5 (GLOVE) ×2
GLOVE BIOGEL PI INDICATOR 7.0 (GLOVE) ×4
GLOVE BIOGEL PI INDICATOR 7.5 (GLOVE) ×2
GLOVE ECLIPSE 7.5 STRL STRAW (GLOVE) ×3 IMPLANT
GLOVE SURG ORTHO 8.0 STRL STRW (GLOVE) ×2 IMPLANT
GLOVE SURG SS PI 6.0 STRL IVOR (GLOVE) ×2 IMPLANT
GOWN STRL REUS W/ TWL LRG LVL3 (GOWN DISPOSABLE) IMPLANT
GOWN STRL REUS W/TWL LRG LVL3 (GOWN DISPOSABLE) ×3
GOWN STRL REUS W/TWL XL LVL3 (GOWN DISPOSABLE) ×10 IMPLANT
KIT BASIN OR (CUSTOM PROCEDURE TRAY) ×3 IMPLANT
MESH HERNIA 7X10 (Mesh General) ×2 IMPLANT
PAD POSITIONING PINK XL (MISCELLANEOUS) ×3 IMPLANT
QUICK LOAD TK 5 (STAPLE) ×8
SCISSORS LAP 5X35 DISP (ENDOMECHANICALS) ×3 IMPLANT
SET IRRIG TUBING LAPAROSCOPIC (IRRIGATION / IRRIGATOR) ×3 IMPLANT
SET TUBE SMOKE EVAC HIGH FLOW (TUBING) ×3 IMPLANT
SHEARS HARMONIC ACE PLUS 36CM (ENDOMECHANICALS) ×2 IMPLANT
SLEEVE ADV FIXATION 5X100MM (TROCAR) ×8 IMPLANT
STAPLER VISISTAT 35W (STAPLE) IMPLANT
SUT MNCRL AB 4-0 PS2 18 (SUTURE) ×3 IMPLANT
SUT SURGIDAC NAB ES-9 0 48 120 (SUTURE) ×16 IMPLANT
SUT VIC AB 3-0 SH 27 (SUTURE) ×12
SUT VIC AB 3-0 SH 27XBRD (SUTURE) IMPLANT
TIP INNERVISION DETACH 40FR (MISCELLANEOUS) IMPLANT
TIP RIGID 35CM EVICEL (HEMOSTASIS) ×2 IMPLANT
TIPS INNERVISION DETACH 40FR (MISCELLANEOUS)
TOWEL OR 17X26 10 PK STRL BLUE (TOWEL DISPOSABLE) ×3 IMPLANT
TOWEL OR NON WOVEN STRL DISP B (DISPOSABLE) ×3 IMPLANT
TRAY FOLEY MTR SLVR 14FR STAT (SET/KITS/TRAYS/PACK) ×2 IMPLANT
TRAY LAPAROSCOPIC (CUSTOM PROCEDURE TRAY) ×3 IMPLANT
TROCAR ADV FIXATION 11X100MM (TROCAR) ×2 IMPLANT
TROCAR ADV FIXATION 5X100MM (TROCAR) ×3 IMPLANT
TROCAR BLADELESS OPT 5 100 (ENDOMECHANICALS) ×3 IMPLANT

## 2018-10-29 NOTE — Transfer of Care (Signed)
Immediate Anesthesia Transfer of Care Note  Patient: Erika [name]  Procedure(s) Performed: LAPAROSCOPIC REPAIR OF HIATAL HERNIA WITH NISSEN FUNDOPLICATION, WITH MESH (N/A Abdomen)  Patient Location: PACU  Anesthesia Type:General  Level of Consciousness: awake, alert , oriented and drowsy  Airway & Oxygen Therapy: Patient Spontanous Breathing and Patient connected to face mask oxygen  Post-op Assessment: Report given to RN and Post -op Vital signs reviewed and stable  Post vital signs: Reviewed and stable  Last Vitals:  Vitals Value Taken Time  BP 147/72 10/29/2018 10:10 AM  Temp    Pulse 88 10/29/2018 10:13 AM  Resp 14 10/29/2018 10:13 AM  SpO2 100 % 10/29/2018 10:13 AM  Vitals shown include unvalidated device data.  Last Pain:  Vitals:   10/29/18 0609  TempSrc:   PainSc: 0-No pain         Complications: No apparent anesthesia complications

## 2018-10-29 NOTE — Op Note (Signed)
Preoperative Diagnosis: large hiatal hernia and GERD  Postoprative Diagnosis: large hiatal hernia and GERD  Procedure: Procedure(s): LAPAROSCOPIC REPAIR OF HIATAL HERNIA WITH NISSEN FUNDOPLICATION, WITH MESH   Surgeon: Excell Seltzer T   Assistants: Armandina Gemma  Anesthesia:  General endotracheal anesthesia  Indications: 78 year old female with 2 years of episodic severe epigastric and chest pain associated with regurgitation spontaneously resolving. Workup as above reveals a large sliding type hiatal hernia with approximately 60% of the stomach in the chest.. I strongly suspect as did Dr. Henrene Pastor that she is having episodes of obstruction from her hernia.  Extensive preoperative work-up and discussion detailed elsewhere we have elected to proceed with laparoscopic repair of her large hiatal hernia with fundoplication.    Procedure Detail: Patient was brought to the operating room, placed in supine position on the operating table, and general endotracheal anesthesia induced.  The abdomen was widely sterilely prepped and draped.  Foley catheter was placed.  She received broad-spectrum preoperative IV antibiotics.  Patient timeout was performed and correct procedure verified.  Access was obtained with a 5 mm Optiview trocar in the left upper quadrant without difficulty and pneumoperitoneum established.  There was no evidence of trocar injury.  Under direct vision a 5 mm trocar was placed laterally in the right upper quadrant and an 11 mm trocar in the right upper mid abdomen.  A 5 mm port was placed just to the left of the umbilicus for the camera and finally a 5 mm port in the lateral left upper quadrant.  Through a 5 mm subxiphoid site the [facility] retractor was placed in the left lobe of the liver elevated with excellent exposure of the hiatus.  A bilateral T AP block was performed with dilute Exparel .  There was a large hiatal defect with at least half of the stomach above the diaphragm.  The  stomach and omentum were reduced down into the abdomen without difficulty.  The gastrohepatic omentum was divided along an avascular area with harmonic scalpel and the right crus exposed.  Peritoneum along the edge of the right crus was incised with harmonic scalpel and the dissection carried up over the anterior hiatus and down along the anterior left crus.  The hernia sac was noted carefully bluntly and with some harmonic dissection dissected down out of the mediastinum.  A large hernia sac was dissected down below the hiatus.  The esophagus was identified and protected.  Following this beginning at the mid fundus short gastric vessels were divided with the harmonic scalpel and the lesser sac entered.  The dissection continued proximally along the fundus up toward the angle of Hiss.  The more posterior left crus was dissected and the hernia sac further reduced.  The dissection was continued posteriorly back to the confluence of the crura.  A Penrose drain was then passed behind the EG junction allowing further retraction.  The esophagus was mobilized extensively up into the mediastinum to the level of the azygos.  This allowed good esophageal length with a couple centimeters of esophagus lying in the abdominal cavity under no tension.  Hernia sac had been completely dissected.  This was mostly excised with the harmonic scalpel and removed.  A posterior crural repair was then done with interrupted 0 Ethibond sutures working anteriorly without undue tension until the hiatus was closed down to the 2-1/2 cm.  A Cook soft tissue patch was used, moistened and introduced into the peritoneal cavity and positioned over the crural repair and bilaterally up around  the hiatus and anterior to the esophagus.  This was tacked into place with interrupted 3-0 Vicryl and additionally fixed with Evaseal.  A 360 degree wrap was then created.  Umbilical portion of the fundus was identified proximally and brought up around the  esophagus and a contiguous portion of the fundus on the left side identified for the wrap.  A 50 bougie dilator was advanced easily under direct vision into the stomach.  The wrap was then created around the esophagus and dilator with interrupted 0 Ethibond 3 sutures 1 cm apart creating a 2 cm wrap incorporating bites of the anterior esophagus.  The dilator was removed and the wrap was seen to be nice and floppy.  The abdomen was inspected for bleeding or injury and no problems seen.  The Nathanson retractor was removed under direct vision.  All CO2 was evacuated and trochars removed.  Skin incisions were closed with subcuticular Monocryl and Dermabond.  Sponge needle and instrument counts were correct.    Findings: As above  Estimated Blood Loss:  Minimal         Drains: None  Blood Given: none          Specimens: None        Complications:  * No complications entered in OR log *         Disposition: PACU - hemodynamically stable.         Condition: stable

## 2018-10-29 NOTE — Anesthesia Postprocedure Evaluation (Signed)
Anesthesia Post Note  Patient: Erika [name]  Procedure(s) Performed: LAPAROSCOPIC REPAIR OF HIATAL HERNIA WITH NISSEN FUNDOPLICATION, WITH MESH (N/A Abdomen)     Patient location during evaluation: PACU Anesthesia Type: General Level of consciousness: awake and alert Pain management: pain level controlled Vital Signs Assessment: post-procedure vital signs reviewed and stable Respiratory status: spontaneous breathing, nonlabored ventilation, respiratory function stable and patient connected to nasal cannula oxygen Cardiovascular status: blood pressure returned to baseline and stable Postop Assessment: no apparent nausea or vomiting Anesthetic complications: no    Last Vitals:  Vitals:   10/29/18 1115 10/29/18 1130  BP: (!) 161/84 (!) 154/93  Pulse: 79 84  Resp: 14 17  Temp: (!) 35.4 C (!) 36.4 C  SpO2: 94% 94%    Last Pain:  Vitals:   10/29/18 1130  TempSrc:   PainSc: 0-No pain                 Jp Eastham S

## 2018-10-29 NOTE — Interval H&P Note (Deleted)
History and Physical Interval Note:  10/29/2018 7:25 AM  Erika [name]  has presented today for surgery, with the diagnosis of large hiatal hernia and GERD  The various methods of treatment have been discussed with the patient and family. After consideration of risks, benefits and other options for treatment, the patient has consented to  Procedure(s): [place] (N/A) as a surgical intervention .  The patient's history has been reviewed, patient examined, no change in status, stable for surgery.  I have reviewed the patient's chart and labs.  Questions were answered to the patient's satisfaction.     Darene Lamer Tarrence Enck

## 2018-10-29 NOTE — Interval H&P Note (Signed)
History and Physical Interval Note:  10/29/2018 7:32 AM  Erika [name]  has presented today for surgery, with the diagnosis of large hiatal hernia and GERD  The various methods of treatment have been discussed with the patient and family. After consideration of risks, benefits and other options for treatment, the patient has consented to  Procedure(s): [name] (N/A) as a surgical intervention .  The patient's history has been reviewed, patient examined, no change in status, stable for surgery.  I have reviewed the patient's chart and labs.  Questions were answered to the patient's satisfaction.     Darene Lamer Sylina Henion

## 2018-10-29 NOTE — H&P (Signed)
History of Present Illness  The patient is a 78 year old female who presents with a hiatal hernia. Patient is a very pleasant 78 year old female referred by Dr. Scarlette Shorts for a symptomatic large hiatal hernia. The patient reports that for about the past 2 years she has had episodes about monthly where she will develop after eating the rapid onset of nausea, epigastric and low chest pain and regurgitation of a large amount of mucous material. This will last often for several hours or even longer and then spontaneously resolved. Patient has a history of mild GERD and has been on Prilosec for a number of years. She saw Dr. Henrene Pastor for evaluation. I reviewed upper endoscopy showing narrowing of the stomach likely at the site of the hiatal hernia and a duodenal polyp which showed adenoma and subsequently has been removed endoscopically. Upper GI series was obtained which I personally reviewed. This shows a large sliding-type hiatal hernia with approximately 60-70% of the stomach above the diaphragm. There was no obvious obstruction at the time of the exam. Patient comes in today accompanied by her husband. 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   Past Surgical History  Colon Polyp Removal - Open  Foot Surgery  Right.  Diagnostic Studies History Colonoscopy  1-5 years ago Pap Smear  1-5 years ago  Allergies  Allergies Reconciled   Current Facility-Administered Medications  Medication Dose Route Frequency Provider Last Rate Last Dose  . bupivacaine liposome (EXPAREL) 1.3 % injection 266 mg  20 mL Infiltration Once Excell Seltzer, MD      . ceFAZolin (ANCEF) IVPB 2g/100 mL premix  2 g Intravenous On Call to OR Excell Seltzer, MD      . lactated ringers infusion   Intravenous Continuous Myrtie Soman, MD 10 mL/hr at 10/29/18 [phone]      Social History  Caffeine use  Tea. Tobacco use  Never smoker.   Other Problems Arthritis  Back Pain   Gastroesophageal Reflux Disease  Hemorrhoids  Hypercholesterolemia  Sleep Apnea     Review of Systems  Skin Not Present- Change in Wart/Mole, Dryness, Hives, Jaundice, New Lesions, Non-Healing Wounds, Rash and Ulcer. HEENT Present- Wears glasses/contact lenses. Not Present- Earache, Hearing Loss, Hoarseness, Nose Bleed, Oral Ulcers, Ringing in the Ears, Seasonal Allergies, Sinus Pain, Sore Throat, Visual Disturbances and Yellow Eyes. Respiratory Not Present- Bloody sputum, Chronic Cough, Difficulty Breathing, Snoring and Wheezing. Breast Not Present- Breast Mass, Breast Pain, Nipple Discharge and Skin Changes. Cardiovascular Not Present- Chest Pain, Difficulty Breathing Lying Down, Leg Cramps, Palpitations, Rapid Heart Rate, Shortness of Breath and Swelling of Extremities. Gastrointestinal Present- Excessive gas and Hemorrhoids. Not Present- Abdominal Pain, Bloating, Bloody Stool, Change in Bowel Habits, Chronic diarrhea, Constipation, Difficulty Swallowing, Gets full quickly at meals, Indigestion, Nausea, Rectal Pain and Vomiting. Musculoskeletal Present- Back Pain and Muscle Pain. Not Present- Joint Pain, Joint Stiffness, Muscle Weakness and Swelling of Extremities.  PE BP (!) 165/80   Pulse 60   Temp 98.7 F (37.1 C) (Oral)   Resp 16   Ht 5\' 3"  (1.6 m)   Wt 54.5 kg   SpO2 100%   BMI 21.28 kg/m  The physical exam findings are as follows: Note:General: Alert, well-developed and well nourished Caucasian female, in no distress Skin: Warm and dry without rash or infection. HEENT: No palpable masses or thyromegaly. Sclera nonicteric. Lymph nodes: No cervical, supraclavicular, or inguinal nodes palpable. Lungs: Breath sounds clear and equal. No wheezing or increased work  of breathing. Cardiovascular: Regular rate and rhythm without murmer. Abdomen: Nondistended. Soft and nontender. No masses palpable. No organomegaly. No palpable hernias. Extremities: No edema or joint swelling  or deformity. No chronic venous stasis changes. Neurologic: Alert and fully oriented. Gait normal. No focal weakness. Psychiatric: Normal mood and affect. Thought content appropriate with normal judgement and insight    Assessment & Plan  LARGE HIATAL HERNIA (K44.9) Impression: 78 year old female with 2 years of episodic severe epigastric and chest pain associated with regurgitation spontaneously resolving. Workup as above reveals a large sliding type hiatal hernia. I strongly suspect as did Dr. Henrene Pastor that she is having episodes of obstruction from her hernia. I discussed the nature of the problem and pathophysiology with the patient and her husband. I discussed laparoscopic repair of her hiatal hernia. I also recommended Nissen fundoplication with her history of reflux and being on Prilosec chronically. I think it is likely we can resolve these episodes with hernia repair. We discussed expected recovery as well as risks of anesthetic complications, bleeding, infection, visceral injury, risk of recurrence and esophageal stricture and gas bloat. She feels that these episodes are very severe and intolerable and likely gradually worsening and strongly wants to proceed with repair. I think this is reasonable. We will proceed with scheduling.  Current Plans Laparoscopic repair of large hiatal hernia with Nissen fundoplication

## 2018-10-29 NOTE — H&P (Deleted)
History of Present Illness Erika [name] T. Amaar Oshita MD; 07/27/2018 2:07 PM) The patient is a 78 year old female who presents with a hiatal hernia. Patient is a very pleasant 78 year old female referred by Dr. Scarlette Shorts for a symptomatic large hiatal hernia. The patient reports that for about the past 2 years she has had episodes about monthly where she will develop after eating the rapid onset of nausea, epigastric and low chest pain and regurgitation of a large amount of mucous material. This will last often for several hours or even longer and then spontaneously resolved. Patient has a history of mild GERD and has been on Prilosec for a number of years. She saw Dr. Henrene Pastor for evaluation. I reviewed upper endoscopy showing narrowing of the stomach likely at the site of the hiatal hernia and a duodenal polyp which showed adenoma and subsequently has been removed endoscopically. Upper GI series was obtained which I personally reviewed. This shows a large sliding-type hiatal hernia with approximately 60-70% of the stomach above the diaphragm. There was no obvious obstruction at the time of the exam. Patient comes in today accompanied by her husband. 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   Past Surgical History Erika [name], [place]; 07/27/2018 1:35 PM) Colon Polyp Removal - Open  Foot Surgery  Right.  Diagnostic Studies History Erika [name], [place]; 07/27/2018 1:35 PM) Colonoscopy  1-5 years ago Pap Smear  1-5 years ago  Allergies Erika [name], [place]; 07/27/2018 1:37 PM) Allergies Reconciled   Medication History Erika [name], RMA; 07/27/2018 1:38 PM) Fosamax (70MG  Tablet, Oral) Active. Oscal 500/200 D-3 (500-200MG -UNIT Tablet, Oral) Active. Vitamin D3 (2000UNIT Capsule, Oral) Active. Omeprazole (40MG  Capsule DR, Oral) Active. Probiotic Advanced (Oral) Active. Medications Reconciled  Social History Erika [name], [place]; 07/27/2018 1:35 PM) Caffeine use   Tea. Tobacco use  Never smoker.  Pregnancy / Birth History Erika [name], [place]; 07/27/2018 1:35 PM) Age of menopause  <43 Gravida  1 Maternal age  8-35  Other Problems Erika [name], [place]; 07/27/2018 1:35 PM) Arthritis  Back Pain  Gastroesophageal Reflux Disease  Hemorrhoids  Hypercholesterolemia  Sleep Apnea     Review of Systems Erika [name] RMA; 07/27/2018 1:35 PM) Skin Not Present- Change in Wart/Mole, Dryness, Hives, Jaundice, New Lesions, Non-Healing Wounds, Rash and Ulcer. HEENT Present- Wears glasses/contact lenses. Not Present- Earache, Hearing Loss, Hoarseness, Nose Bleed, Oral Ulcers, Ringing in the Ears, Seasonal Allergies, Sinus Pain, Sore Throat, Visual Disturbances and Yellow Eyes. Respiratory Not Present- Bloody sputum, Chronic Cough, Difficulty Breathing, Snoring and Wheezing. Breast Not Present- Breast Mass, Breast Pain, Nipple Discharge and Skin Changes. Cardiovascular Not Present- Chest Pain, Difficulty Breathing Lying Down, Leg Cramps, Palpitations, Rapid Heart Rate, Shortness of Breath and Swelling of Extremities. Gastrointestinal Present- Excessive gas and Hemorrhoids. Not Present- Abdominal Pain, Bloating, Bloody Stool, Change in Bowel Habits, Chronic diarrhea, Constipation, Difficulty Swallowing, Gets full quickly at meals, Indigestion, Nausea, Rectal Pain and Vomiting. Musculoskeletal Present- Back Pain and Muscle Pain. Not Present- Joint Pain, Joint Stiffness, Muscle Weakness and Swelling of Extremities.  Vitals Erika [name] RMA; 07/27/2018 1:37 PM) 07/27/2018 1:36 PM Weight: 126.2 lb Height: 63in Body Surface Area: 1.59 m Body Mass Index: 22.36 kg/m  Temp.: 97.57F  Pulse: 97 (Regular)  BP: 145/82 (Sitting, Left Arm, Standard)       Physical Exam Erika [name] T. Obryan Radu MD; 07/27/2018 2:07 PM) The physical exam findings are as follows: Note:General: Alert, well-developed and well nourished Caucasian female, in no  distress Skin: Warm and dry  without rash or infection. HEENT: No palpable masses or thyromegaly. Sclera nonicteric. Lymph nodes: No cervical, supraclavicular, or inguinal nodes palpable. Lungs: Breath sounds clear and equal. No wheezing or increased work of breathing. Cardiovascular: Regular rate and rhythm without murmer. Abdomen: Nondistended. Soft and nontender. No masses palpable. No organomegaly. No palpable hernias. Extremities: No edema or joint swelling or deformity. No chronic venous stasis changes. Neurologic: Alert and fully oriented. Gait normal. No focal weakness. Psychiatric: Normal mood and affect. Thought content appropriate with normal judgement and insight    Assessment & Plan Erika [name] T. [name] MD; 07/27/2018 2:11 PM) LARGE HIATAL HERNIA (K44.9) Impression: 78 year old female with 2 years of episodic severe epigastric and chest pain associated with regurgitation spontaneously resolving. Workup as above reveals a large sliding type hiatal hernia. I strongly suspect as did Dr. Henrene Pastor that she is having episodes of obstruction from her hernia. I discussed the nature of the problem and pathophysiology with the patient and her husband. I discussed laparoscopic repair of her hiatal hernia. I also recommended Nissen fundoplication with her history of reflux and being on Prilosec chronically. I think it is likely we can resolve these episodes with hernia repair. We discussed expected recovery as well as risks of anesthetic complications, bleeding, infection, visceral injury, risk of recurrence and esophageal stricture and gas bloat. She feels that these episodes are very severe and intolerable and likely gradually worsening and strongly wants to proceed with repair. I think this is reasonable. We will proceed with scheduling.  Current Plans Laparoscopic repair of large hiatal hernia with Nissen fundoplication

## 2018-10-29 NOTE — H&P (Signed)
History of Present Illness  The patient is a 78 year old female who presents with a hiatal hernia. Patient is a very pleasant 78 year old female referred by Dr. Scarlette Shorts for a symptomatic large hiatal hernia. The patient reports that for about the past 2 years she has had episodes about monthly where she will develop after eating the rapid onset of nausea, epigastric and low chest pain and regurgitation of a large amount of mucous material. This will last often for several hours or even longer and then spontaneously resolved. Patient has a history of mild GERD and has been on Prilosec for a number of years. She saw Dr. Henrene Pastor for evaluation. I reviewed upper endoscopy showing narrowing of the stomach likely at the site of the hiatal hernia and a duodenal polyp which showed adenoma and subsequently has been removed endoscopically. Upper GI series was obtained which I personally reviewed. This shows a large sliding-type hiatal hernia with approximately 60-70% of the stomach above the diaphragm. There was no obvious obstruction at the time of the exam. Patient comes in today accompanied by her husband. 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   Past Surgical History  Colon Polyp Removal - Open  Foot Surgery  Right.  Diagnostic Studies History Colonoscopy  1-5 years ago Pap Smear  1-5 years ago  Allergies  Allergies Reconciled   Medication History  Fosamax (70MG  Tablet, Oral) Active. Oscal 500/200 D-3 (500-200MG -UNIT Tablet, Oral) Active. Vitamin D3 (2000UNIT Capsule, Oral) Active. Omeprazole (40MG  Capsule DR, Oral) Active. Probiotic Advanced (Oral) Active. Medications Reconciled  Social History  Caffeine use  Tea. Tobacco use  Never smoker.  Pregnancy / Birth History  Age of menopause  <43 Gravida  1 Maternal age  29-35  Other Problems Arthritis  Back Pain  Gastroesophageal Reflux Disease  Hemorrhoids  Hypercholesterolemia   Sleep Apnea     Review of Systems  Skin Not Present- Change in Wart/Mole, Dryness, Hives, Jaundice, New Lesions, Non-Healing Wounds, Rash and Ulcer. HEENT Present- Wears glasses/contact lenses. Not Present- Earache, Hearing Loss, Hoarseness, Nose Bleed, Oral Ulcers, Ringing in the Ears, Seasonal Allergies, Sinus Pain, Sore Throat, Visual Disturbances and Yellow Eyes. Respiratory Not Present- Bloody sputum, Chronic Cough, Difficulty Breathing, Snoring and Wheezing. Breast Not Present- Breast Mass, Breast Pain, Nipple Discharge and Skin Changes. Cardiovascular Not Present- Chest Pain, Difficulty Breathing Lying Down, Leg Cramps, Palpitations, Rapid Heart Rate, Shortness of Breath and Swelling of Extremities. Gastrointestinal Present- Excessive gas and Hemorrhoids. Not Present- Abdominal Pain, Bloating, Bloody Stool, Change in Bowel Habits, Chronic diarrhea, Constipation, Difficulty Swallowing, Gets full quickly at meals, Indigestion, Nausea, Rectal Pain and Vomiting. Musculoskeletal Present- Back Pain and Muscle Pain. Not Present- Joint Pain, Joint Stiffness, Muscle Weakness and Swelling of Extremities.  Vitals   Weight: 126.2 lb Height: 63in Body Surface Area: 1.59 m Body Mass Index: 22.36 kg/m  Temp.: 97.8F  Pulse: 97 (Regular)  BP: 145/82 (Sitting, Left Arm, Standard)       Physical Exam  The physical exam findings are as follows: Note:General: Alert, well-developed and well nourished Caucasian female, in no distress Skin: Warm and dry without rash or infection. HEENT: No palpable masses or thyromegaly. Sclera nonicteric. Lymph nodes: No cervical, supraclavicular, or inguinal nodes palpable. Lungs: Breath sounds clear and equal. No wheezing or increased work of breathing. Cardiovascular: Regular rate and rhythm without murmer. Abdomen: Nondistended. Soft and nontender. No masses palpable. No organomegaly. No palpable hernias. Extremities: No edema or joint  swelling or  deformity. No chronic venous stasis changes. Neurologic: Alert and fully oriented. Gait normal. No focal weakness. Psychiatric: Normal mood and affect. Thought content appropriate with normal judgement and insight    Assessment & Plan Marland Kitchen T. Wess Baney MD LARGE HIATAL HERNIA (K44.9) Impression: 78 year old female with 2 years of episodic severe epigastric and chest pain associated with regurgitation spontaneously resolving. Workup as above reveals a large sliding type hiatal hernia. I strongly suspect as did Dr. Henrene Pastor that she is having episodes of obstruction from her hernia. I discussed the nature of the problem and pathophysiology with the patient and her husband. I discussed laparoscopic repair of her hiatal hernia. I also recommended Nissen fundoplication with her history of reflux and being on Prilosec chronically. I think it is likely we can resolve these episodes with hernia repair. We discussed expected recovery as well as risks of anesthetic complications, bleeding, infection, visceral injury, risk of recurrence and esophageal stricture and gas bloat. She feels that these episodes are very severe and intolerable and likely gradually worsening and strongly wants to proceed with repair. I think this is reasonable. We will proceed with scheduling.  Current Plans Laparoscopic repair of large hiatal hernia with Nissen fundoplication

## 2018-10-29 NOTE — Anesthesia Procedure Notes (Signed)
Procedure Name: Intubation Date/Time: 10/29/2018 7:42 AM Performed by: Garrel Ridgel, CRNA Pre-anesthesia Checklist: Patient identified Patient Re-evaluated:Patient Re-evaluated prior to induction Oxygen Delivery Method: Circle system utilized Preoxygenation: Pre-oxygenation with 100% oxygen Induction Type: IV induction Ventilation: Mask ventilation without difficulty Grade View: Grade I Tube type: Oral Number of attempts: 1 Airway Equipment and Method: Stylet Placement Confirmation: ETT inserted through vocal cords under direct vision Secured at: 21 cm Tube secured with: Tape Dental Injury: Teeth and Oropharynx as per pre-operative assessment

## 2018-10-30 ENCOUNTER — Encounter (HOSPITAL_COMMUNITY): Payer: Self-pay | Admitting: General Surgery

## 2018-10-30 DIAGNOSIS — K219 Gastro-esophageal reflux disease without esophagitis: Secondary | ICD-10-CM | POA: Diagnosis present

## 2018-10-30 DIAGNOSIS — K440 Diaphragmatic hernia with obstruction, without gangrene: Secondary | ICD-10-CM | POA: Diagnosis present

## 2018-10-30 DIAGNOSIS — G4730 Sleep apnea, unspecified: Secondary | ICD-10-CM | POA: Diagnosis present

## 2018-10-30 DIAGNOSIS — M1990 Unspecified osteoarthritis, unspecified site: Secondary | ICD-10-CM | POA: Diagnosis present

## 2018-10-30 DIAGNOSIS — K449 Diaphragmatic hernia without obstruction or gangrene: Secondary | ICD-10-CM | POA: Diagnosis not present

## 2018-10-30 DIAGNOSIS — I10 Essential (primary) hypertension: Secondary | ICD-10-CM | POA: Diagnosis present

## 2018-10-30 LAB — BASIC METABOLIC PANEL
Anion gap: 6 (ref 5–15)
BUN: 13 mg/dL (ref 8–23)
CO2: 27 mmol/L (ref 22–32)
Calcium: 8.5 mg/dL — ABNORMAL LOW (ref 8.9–10.3)
Chloride: 104 mmol/L (ref 98–111)
Creatinine, Ser: 0.73 mg/dL (ref 0.44–1.00)
GFR calc Af Amer: >60 mL/min (ref >60)
GFR calc non Af Amer: >60 mL/min (ref >60)
Glucose, Bld: 93 mg/dL (ref 70–99)
Potassium: 4.1 mmol/L (ref 3.5–5.1)
Sodium: 137 mmol/L (ref 135–145)

## 2018-10-30 LAB — CBC
HEMATOCRIT: 34.8 % — AB (ref 36.0–46.0)
Hemoglobin: 10.8 g/dL — ABNORMAL LOW (ref 12.0–15.0)
MCH: 26.5 pg (ref 26.0–34.0)
MCHC: 31 g/dL (ref 30.0–36.0)
MCV: 85.3 fL (ref 80.0–100.0)
Platelets: 205 10*3/uL (ref 150–400)
RBC: 4.08 MIL/uL (ref 3.87–5.11)
RDW: 13.2 % (ref 11.5–15.5)
WBC: 10.7 10*3/uL — ABNORMAL HIGH (ref 4.0–10.5)
nRBC: 0 % (ref 0.0–0.2)

## 2018-10-30 NOTE — Progress Notes (Signed)
Patient ID: Erika [name], female   DOB: [birth year], 78 y.o.   MRN: [id number] 1 Day Post-Op   Subjective: Very drowsy and nauseated yesterday.  Overall feels better today with these issues resolved.  However she had quite a bit of epigastric pain after drinking clear liquids this morning.  This is now improving after oral pain meds.  Objective: Vital signs in last 24 hours: Temp:  [95.8 F (35.4 C)-98.7 F (37.1 C)] 97.5 F (36.4 C) (02/04 0921) Pulse Rate:  [66-92] 72 (02/04 0921) Resp:  [14-20] 16 (02/04 0921) BP: (144-173)/(68-93) 155/68 (02/04 0921) SpO2:  [94 %-100 %] 97 % (02/04 0921) Last BM Date: 10/28/18  Intake/Output from previous day: 02/03 0701 - 02/04 0700 In: 2187.8 [P.O.:150; I.V.:2037.8] Out: 1330 [Urine:1290; Blood:40] Intake/Output this shift: Total I/O In: 304 [P.O.:120; I.V.:184] Out: 0   General appearance: alert, cooperative and no distress GI: Nondistended.  Soft and nontender. Incision/Wound: Clean and dry  Lab Results:  Recent Labs    10/30/18 0456  WBC 10.7*  HGB 10.8*  HCT 34.8*  PLT 205   BMET Recent Labs    10/30/18 0456  NA 137  K 4.1  CL 104  CO2 27  GLUCOSE 93  BUN 13  CREATININE 0.73  CALCIUM 8.5*     Studies/Results: No results found.  Anti-infectives: Anti-infectives (From admission, onward)   Start     Dose/Rate Route Frequency Ordered Stop   10/29/18 0600  ceFAZolin (ANCEF) IVPB 2g/100 mL premix     2 g 200 mL/hr over 30 Minutes Intravenous On call to O.R. 10/29/18 0545 10/29/18 0744      Assessment/Plan: s/p Procedure(s): LAPAROSCOPIC REPAIR OF HIATAL HERNIA WITH NISSEN FUNDOPLICATION, WITH MESH Stable postoperatively.  However not tolerating liquids adequately at this point due to discomfort.  I do not see any evidence of complication.  Observe today and monitor oral intake.  Ambulation encouraged.   LOS: 0 days    Edward Jolly 10/30/2018

## 2018-10-30 NOTE — Progress Notes (Signed)
Patient reports she did not tolerate nocturnal CPAP last night and does not wish to continue use at this time. She denies home use in more than 5 years and only used occasionally for 3 years prior to that. Equipment remains at the bedside for use if she should wish to give another try. RT will continue to follow.

## 2018-10-31 ENCOUNTER — Inpatient Hospital Stay (HOSPITAL_COMMUNITY): Payer: Medicare Other

## 2018-10-31 LAB — CBC
HCT: 38.2 % (ref 36.0–46.0)
Hemoglobin: 11.6 g/dL — ABNORMAL LOW (ref 12.0–15.0)
MCH: 26.7 pg (ref 26.0–34.0)
MCHC: 30.4 g/dL (ref 30.0–36.0)
MCV: 87.8 fL (ref 80.0–100.0)
Platelets: 220 10*3/uL (ref 150–400)
RBC: 4.35 MIL/uL (ref 3.87–5.11)
RDW: 13.5 % (ref 11.5–15.5)
WBC: 11.4 10*3/uL — ABNORMAL HIGH (ref 4.0–10.5)
nRBC: 0 % (ref 0.0–0.2)

## 2018-10-31 LAB — BASIC METABOLIC PANEL
ANION GAP: 8 (ref 5–15)
BUN: 12 mg/dL (ref 8–23)
CO2: 27 mmol/L (ref 22–32)
Calcium: 8.8 mg/dL — ABNORMAL LOW (ref 8.9–10.3)
Chloride: 102 mmol/L (ref 98–111)
Creatinine, Ser: 0.88 mg/dL (ref 0.44–1.00)
GFR calc Af Amer: >60 mL/min (ref >60)
GFR calc non Af Amer: >60 mL/min (ref >60)
Glucose, Bld: 83 mg/dL (ref 70–99)
Potassium: 4.1 mmol/L (ref 3.5–5.1)
Sodium: 137 mmol/L (ref 135–145)

## 2018-10-31 MED ORDER — IOHEXOL 180 MG/ML  SOLN
150.0000 mL | Freq: Once | INTRAMUSCULAR | Status: AC | PRN
  Administered 2018-10-31: 150 mL via ORAL

## 2018-10-31 NOTE — Progress Notes (Signed)
Patient continues to decline nocturnal CPAP. Order changed to prn per RT protocol. Equipment removed from bedside.  

## 2018-10-31 NOTE — Progress Notes (Signed)
Patient ID: Erika [name], female   DOB: 06-30-1941, 78 y.o.   MRN: [id number] 2 Days Post-Op   Subjective: Does not feel well.  Complaining of headache and upper abdominal pain.  Feels like her abdomen has been "hit with a hammer".  Swallowing some fluids but small amounts and feels like she fills up and cannot take anymore.  Also causes some pain with fluids.  No vomiting.  Objective: Vital signs in last 24 hours: Temp:  [97.5 F (36.4 C)-99 F (37.2 C)] 98.7 F (37.1 C) (02/05 0557) Pulse Rate:  [72-89] 82 (02/05 0557) Resp:  [16-18] 18 (02/05 0557) BP: (154-160)/(68-92) 160/84 (02/05 0557) SpO2:  [94 %-100 %] 96 % (02/05 0557) Last BM Date: 10/28/18  Intake/Output from previous day: 02/04 0701 - 02/05 0700 In: 1742.4 [P.O.:552; I.V.:1190.4] Out: 1550 [JEHUD:1497] Intake/Output this shift: No intake/output data recorded.  General appearance: alert, cooperative, fatigued and no distress Resp: clear to auscultation bilaterally GI: Nondistended.  Mild upper abdominal tenderness without guarding. Incision/Wound: No erythema or drainage.  Lab Results:  Recent Labs    10/30/18 0456  WBC 10.7*  HGB 10.8*  HCT 34.8*  PLT 205   BMET Recent Labs    10/30/18 0456  NA 137  K 4.1  CL 104  CO2 27  GLUCOSE 93  BUN 13  CREATININE 0.73  CALCIUM 8.5*     Studies/Results: No results found.  Anti-infectives: Anti-infectives (From admission, onward)   Start     Dose/Rate Route Frequency Ordered Stop   10/29/18 0600  ceFAZolin (ANCEF) IVPB 2g/100 mL premix     2 g 200 mL/hr over 30 Minutes Intravenous On call to O.R. 10/29/18 0545 10/29/18 0744      Assessment/Plan: s/p Procedure(s): LAPAROSCOPIC REPAIR OF HIATAL HERNIA WITH NISSEN FUNDOPLICATION, WITH MESH Not progressing well.  Tolerating some fluids but not adequate oral intake and complaining of discomfort.  Her pain seems to be incisional as her abdomen is essentially nontender. Will evaluate further with  Gastrografin swallow, recheck CBC and be met today.  Ambulation and continued efforts at fluids encouraged.  Not yet ready for discharge.   LOS: 1 day    Edward Jolly 10/31/2018

## 2018-11-01 MED ORDER — POLYETHYLENE GLYCOL 3350 17 G PO PACK
17.0000 g | PACK | Freq: Every day | ORAL | Status: DC
  Administered 2018-11-01: 17 g via ORAL
  Filled 2018-11-01: qty 1

## 2018-11-01 MED ORDER — POLYETHYLENE GLYCOL (MIRALAX) NICU SYRINGE 0.34GM/ML: 0.5000 g/kg | ORAL | Status: DC

## 2018-11-01 MED ORDER — ACETAMINOPHEN 160 MG/5ML PO SOLN
500.0000 mg | Freq: Four times a day (QID) | ORAL | Status: DC | PRN
  Administered 2018-11-01 (×3): 500 mg via ORAL
  Filled 2018-11-01 (×3): qty 20.3

## 2018-11-01 NOTE — Progress Notes (Signed)
Patient ID: Erika [name], female   DOB: 1940/10/27, 78 y.o.   MRN: [id number] 3 Days Post-Op   Subjective: Erika [name] be a little better but still with mid abdominal Erika, "hit with a hammer", unrelated to eating.  Relieved with oxycodone but then returns.  Did not take much by mouth yesterday.  However she denies nausea or dysphagia or Erika with drinking.  She is not sure why she did not drink more.  No BM yet.  Objective: Vital signs in last 24 hours: Temp:  [98.5 F (36.9 C)-99.1 F (37.3 C)] 98.5 F (36.9 C) (02/06 0545) Pulse Rate:  [77-95] 90 (02/06 0545) Resp:  [16-18] 16 (02/06 0545) BP: (147-161)/(64-81) 161/81 (02/06 0545) SpO2:  [94 %-97 %] 94 % (02/06 0545) Last BM Date: 10/28/18  Intake/Output from previous day: 02/05 0701 - 02/06 0700 In: 1471.6 [P.O.:360; I.V.:1111.6] Out: 700 [Urine:700] Intake/Output this shift: No intake/output data recorded.  General appearance: alert, cooperative and no distress GI: Nondistended.  Soft and nontender. Incision/Wound: No erythema or drainage  Lab Results:  Recent Labs    10/30/18 0456 10/31/18 0810  WBC 10.7* 11.4*  HGB 10.8* 11.6*  HCT 34.8* 38.2  PLT 205 220   BMET Recent Labs    10/30/18 0456 10/31/18 0810  NA 137 137  K 4.1 4.1  CL 104 102  CO2 27 27  GLUCOSE 93 83  BUN 13 12  CREATININE 0.73 0.88  CALCIUM 8.5* 8.8*     Studies/Results: Dg Ugi W Single Cm (sol Or Thin Ba)  Result Date: 10/31/2018 CLINICAL DATA:  78 year old female with history of Nissen fundoplication for repair of large hiatal hernia. Upper abdominal Erika. Evaluate for postoperative leak. EXAM: WATER SOLUBLE UPPER GI SERIES TECHNIQUE: Single-column upper GI series was performed using water soluble contrast. CONTRAST:  75 mL of Omnipaque 300. COMPARISON:  07/05/2017. FLUOROSCOPY TIME:  Fluoroscopy Time:  2.0 minutes Radiation Exposure Index (if provided by the fluoroscopic device): 9.4 mGy FINDINGS: Single contrast images of the esophagus  demonstrated esophageal dysmotility with extensive tertiary contractions. Postoperative changes of Nissen fundoplication are noted. Contrast extended into the stomach beneath wrap and was later observed traversing the pylorus. No extravasation of contrast material was identified during the examination. A small amount of pneumoperitoneum was evident, presumably iatrogenic in the setting of recent surgery. IMPRESSION: 1. Postoperative changes of Nissen fundoplication with no evidence of extravasation of contrast material. 2. Small amount of pneumoperitoneum, presumably iatrogenic in the setting of recent surgery. 3. Esophageal dysmotility with extensive tertiary contractions. Electronically Signed   By: Vinnie Langton M.D.   On: 10/31/2018 10:44    Anti-infectives: Anti-infectives (From admission, onward)   Start     Dose/Rate Route Frequency Ordered Stop   10/29/18 0600  ceFAZolin (ANCEF) IVPB 2g/100 mL premix     2 g 200 mL/hr over 30 Minutes Intravenous On call to O.R. 10/29/18 0545 10/29/18 0744      Assessment/Plan: s/p Procedure(s): LAPAROSCOPIC REPAIR OF HIATAL HERNIA WITH NISSEN FUNDOPLICATION, WITH MESH Slow progress.  Continued mid abdominal Erika of uncertain etiology.  Question incisional or abdominal wall bruising?  Gastrografin swallow was unremarkable.  Her abdomen is very benign on exam.  Mild leukocytosis. Try to switch away from oxycodone to Tylenol.  Fluids and ambulation encouraged today.  Repeat CBC in a.m.  Not enough oral intake for discharge.   LOS: 2 days    Edward Jolly 11/01/2018

## 2018-11-02 LAB — CBC
HCT: 34 % — ABNORMAL LOW (ref 36.0–46.0)
Hemoglobin: 10.2 g/dL — ABNORMAL LOW (ref 12.0–15.0)
MCH: 26.4 pg (ref 26.0–34.0)
MCHC: 30 g/dL (ref 30.0–36.0)
MCV: 88.1 fL (ref 80.0–100.0)
Platelets: 223 10*3/uL (ref 150–400)
RBC: 3.86 MIL/uL — ABNORMAL LOW (ref 3.87–5.11)
RDW: 13.5 % (ref 11.5–15.5)
WBC: 8.3 10*3/uL (ref 4.0–10.5)
nRBC: 0 % (ref 0.0–0.2)

## 2018-11-02 NOTE — Discharge Instructions (Signed)
[facility] ______[facility] LAPAROSCOPIC SURGERY: POST OP INSTRUCTIONS Always review your discharge instruction sheet given to you by the facility where your surgery was performed. IF YOU HAVE DISABILITY OR FAMILY LEAVE FORMS, YOU MUST BRING THEM TO THE OFFICE FOR PROCESSING.   DO NOT GIVE THEM TO YOUR DOCTOR.  1. A prescription for pain medication Jolly be given to you upon discharge.  Take your pain medication as prescribed, if needed.  If narcotic pain medicine is not needed, then you Pavich take acetaminophen (Tylenol) or ibuprofen (Advil) as needed. 2. Take your usually prescribed medications unless otherwise directed. 3. If you need a refill on your pain medication, please contact your pharmacy.  They will contact our office to request authorization. Prescriptions will not be filled after 5pm or on week-ends. 4. You should follow a liquid or pured diet for 1 week and then gradually add soft foods 5. Most patients will experience some swelling and bruising in the area of the incisions.  Ice packs will help.  Swelling and bruising can take several days to resolve.  6. It is common to experience some constipation if taking pain medication after surgery.  Increasing fluid intake and taking a stool softener (such as Colace) will usually help or prevent this problem from occurring.  A mild laxative (Milk of Magnesia or Miralax) should be taken according to package instructions if there are no bowel movements after 48 hours. 7. Unless discharge instructions indicate otherwise, you Hypes remove your bandages 24-48 hours after surgery, and you Carmack shower at that time.  You Salvucci have steri-strips (small skin tapes) in place directly over the incision.  These strips should be left on the skin for 7-10 days.  If your surgeon used skin glue on the incision, you Pardue shower in 24 hours.  The glue will flake off over the next 2-3 weeks.  Any sutures or staples will be removed at the office during your  follow-up visit. 8. ACTIVITIES:  You Hanneman resume regular (light) daily activities beginning the next day--such as daily self-care, walking, climbing stairs--gradually increasing activities as tolerated.  You Clabo have sexual intercourse when it is comfortable.  Refrain from any heavy lifting or straining until approved by your doctor. a. You Englander drive when you are no longer taking prescription pain medication, you can comfortably wear a seatbelt, and you can safely maneuver your car and apply brakes. b. RETURN TO WORK:  __________________________________________________________ 9. You should see your doctor in the office for a follow-up appointment approximately 2-3 weeks after your surgery.  Make sure that you call for this appointment within a day or two after you arrive home to insure a convenient appointment time. 10. OTHER INSTRUCTIONS: __________________________________________________________________________________________________________________________ __________________________________________________________________________________________________________________________ WHEN TO CALL YOUR DOCTOR: 1. Fever over 101.0 2. Inability to urinate 3. Continued bleeding from incision. 4. Increased pain, redness, or drainage from the incision. 5. Increasing abdominal pain  The clinic staff is available to answer your questions during regular business hours.  Please dont hesitate to call and ask to speak to one of the nurses for clinical concerns.  If you have a medical emergency, go to the nearest emergency room or call 911.  A surgeon from [facility] is always on call at the hospital. [address] [place], [place], [place]  [address] ? [address], [place], [place]   [phone] ? [phone] ? FAX [phone] Web site: [email or web address]

## 2018-11-02 NOTE — Discharge Summary (Signed)
  Patient ID: Erika [name] [id number] 78 y.o. 26-Aug-1941  10/29/2018  Discharge date and time: 11/02/2018   Admitting Physician: Edward Jolly  Discharge Physician: Edward Jolly  Admission Diagnoses: large hiatal hernia and GERD  Discharge Diagnoses: Same  Operations: Procedure(s): LAPAROSCOPIC REPAIR OF HIATAL HERNIA WITH NISSEN FUNDOPLICATION, WITH MESH  Admission Condition: good  Discharged Condition: good  Indication for Admission: 78 year old female with 2 years of episodic severe epigastric and chest pain associated with regurgitation spontaneously resolving. Workup as above reveals a large sliding type hiatal hernia. I strongly suspect as did Dr. Henrene Pastor that she is having episodes of obstruction from her hernia.  After extensive preoperative work-up and discussion detailed elsewhere she is electively admitted for laparoscopic repair of her hiatal hernia with fundoplication  Hospital Course: On the morning of surgery the patient underwent an uneventful laparoscopic repair of a large hiatal hernia using biologic mesh reinforcement with 209 degree fundoplication.  The procedure was unremarkable.  She however had some significant mid abdominal pain postoperatively but persisted for several days.  She was able to tolerate fluids but was taking small amounts.  I obtained a Gastrografin swallow on the third postoperative day that showed normal post fundoplication anatomy without obstruction or leak.  On the day of discharge she is however feeling much better.  Abdominal pain is essentially relieved.  Tolerating full liquid diet.  Abdomen is benign and wounds healing well.  She is felt ready for discharge.   Disposition: Home  Patient Instructions:  Allergies as of 11/02/2018   No Known Allergies     Medication List    TAKE these medications   alendronate 70 MG tablet Commonly known as:  FOSAMAX Take 1 tablet (70 mg total) by mouth every 7 (seven) days. Take with a full  glass of water on an empty stomach.   aspirin 81 MG tablet Take 1 tablet (81 mg total) by mouth daily.   CALCIUM 600+D 600-800 MG-UNIT Tabs Generic drug:  Calcium Carb-Cholecalciferol Take 1 tablet by mouth every morning.   carbonyl iron 45 MG Tabs tablet Commonly known as:  FEOSOL Take 45 mg by mouth at bedtime.   HEALTHY COLON PO Take 1 capsule by mouth daily.   losartan 25 MG tablet Commonly known as:  COZAAR Take 1 tablet (25 mg total) by mouth daily.   omeprazole 40 MG capsule Commonly known as:  PRILOSEC Take 1 capsule (40 mg total) by mouth daily. What changed:  when to take this   VITAMIN B 12 PO Take 1,000 mcg by mouth every evening.       Activity: activity as tolerated Diet: Full liquid or pured diet for 1 week then soft Wound Care: none needed  Follow-up:  With Austine Kelsay in 3 weeks.  Signed: Edward Jolly MD, FACS  11/02/2018, 9:25 AM

## 2018-11-02 NOTE — Progress Notes (Signed)
Discharge instructions revieweed with patient and spouse. Questions answered and both deny further questions. No prescriptions given. Spouse is here to drive patient home. Donne Hazel, RN

## 2018-11-02 NOTE — Progress Notes (Signed)
Patient ID: Erika [name], female   DOB: [birth year], 78 y.o.   MRN: [id number] 4 Days Post-Op   Subjective: Feels much better today.  Abdominal pain is essentially resolved.  She has been able to tolerate full liquids well.  Had a bowel movement yesterday.  She would like to go home.  Objective: Vital signs in last 24 hours: Temp:  [98 F (36.7 C)-99 F (37.2 C)] 98.7 F (37.1 C) (02/07 0513) Pulse Rate:  [64-83] 64 (02/07 0513) Resp:  [14-17] 14 (02/07 0513) BP: (150-175)/(71-82) 175/71 (02/07 0513) SpO2:  [93 %-97 %] 97 % (02/07 0513) Last BM Date: 10/28/18  Intake/Output from previous day: 02/06 0701 - 02/07 0700 In: 1857.5 [P.O.:600; I.V.:1257.5] Out: 0  Intake/Output this shift: No intake/output data recorded.  General appearance: alert, cooperative and no distress GI: normal findings: soft, non-tender and Nondistended Incision/Wound: Mild bruising.  No erythema or drainage.  Lab Results:  Recent Labs    10/31/18 0810 11/02/18 0454  WBC 11.4* 8.3  HGB 11.6* 10.2*  HCT 38.2 34.0*  PLT 220 223   BMET Recent Labs    10/31/18 0810  NA 137  K 4.1  CL 102  CO2 27  GLUCOSE 83  BUN 12  CREATININE 0.88  CALCIUM 8.8*     Studies/Results: No results found.  Anti-infectives: Anti-infectives (From admission, onward)   Start     Dose/Rate Route Frequency Ordered Stop   10/29/18 0600  ceFAZolin (ANCEF) IVPB 2g/100 mL premix     2 g 200 mL/hr over 30 Minutes Intravenous On call to O.R. 10/29/18 0545 10/29/18 0744      Assessment/Plan: s/p Procedure(s): LAPAROSCOPIC REPAIR OF HIATAL HERNIA WITH NISSEN FUNDOPLICATION, WITH MESH Doing much better today.  Pain essentially resolved.  Leukocytosis resolved.  Tolerating full liquids.  Okay for discharge.   LOS: 3 days    Edward Jolly 11/02/2018

## 2018-11-06 ENCOUNTER — Other Ambulatory Visit: Payer: Self-pay | Admitting: Family Medicine

## 2018-11-06 DIAGNOSIS — Z1231 Encounter for screening mammogram for malignant neoplasm of breast: Secondary | ICD-10-CM

## 2019-01-08 ENCOUNTER — Telehealth: Payer: Self-pay | Admitting: Internal Medicine

## 2019-01-08 ENCOUNTER — Other Ambulatory Visit: Payer: Self-pay

## 2019-01-08 MED ORDER — OMEPRAZOLE 40 MG PO CPDR: 40.0000 mg | DELAYED_RELEASE_CAPSULE | Freq: Every day | ORAL | 2 refills | Status: DC

## 2019-01-08 NOTE — Telephone Encounter (Signed)
Pt requested refill of omeprazole sent to [facility].

## 2019-01-08 NOTE — Telephone Encounter (Signed)
Refilled. Last seen 06-2018

## 2019-01-22 ENCOUNTER — Other Ambulatory Visit: Payer: Self-pay

## 2019-01-22 MED ORDER — OMEPRAZOLE 40 MG PO CPDR: 40.0000 mg | DELAYED_RELEASE_CAPSULE | Freq: Every day | ORAL | 2 refills | Status: DC

## 2019-01-22 NOTE — Progress Notes (Signed)
Called and spoke to [facility]. They did not receive the script we sent on 4-14.  But she still had refills on her old prescriptions so Angelica at [facility] said she would get one sent out right away.  I resent a new script for a 90 supply.  Called and spoke to pt and let her know she should receive the refill soon.  She was appreciative

## 2019-01-22 NOTE — Telephone Encounter (Signed)
Patient called would like to know what is the status on the med that is getting mail to her. She said she has not yet received any med. [phone]

## 2019-06-28 DIAGNOSIS — Z23 Encounter for immunization: Secondary | ICD-10-CM | POA: Diagnosis not present

## 2019-07-04 ENCOUNTER — Encounter: Payer: Self-pay | Admitting: Gynecology

## 2019-08-20 DIAGNOSIS — R413 Other amnesia: Secondary | ICD-10-CM | POA: Diagnosis not present

## 2019-08-20 DIAGNOSIS — F0391 Unspecified dementia with behavioral disturbance: Secondary | ICD-10-CM | POA: Diagnosis not present

## 2019-08-20 DIAGNOSIS — R55 Syncope and collapse: Secondary | ICD-10-CM | POA: Diagnosis not present

## 2019-08-20 DIAGNOSIS — Z23 Encounter for immunization: Secondary | ICD-10-CM | POA: Diagnosis not present

## 2019-08-20 DIAGNOSIS — R519 Headache, unspecified: Secondary | ICD-10-CM | POA: Diagnosis not present

## 2019-08-21 ENCOUNTER — Encounter: Payer: Self-pay | Admitting: Internal Medicine

## 2019-08-27 ENCOUNTER — Other Ambulatory Visit: Payer: Self-pay | Admitting: Family Medicine

## 2019-08-27 DIAGNOSIS — H547 Unspecified visual loss: Secondary | ICD-10-CM

## 2019-08-27 DIAGNOSIS — R519 Headache, unspecified: Secondary | ICD-10-CM

## 2019-08-27 DIAGNOSIS — H2513 Age-related nuclear cataract, bilateral: Secondary | ICD-10-CM | POA: Diagnosis not present

## 2019-08-27 DIAGNOSIS — R413 Other amnesia: Secondary | ICD-10-CM

## 2019-08-27 DIAGNOSIS — R4189 Other symptoms and signs involving cognitive functions and awareness: Secondary | ICD-10-CM

## 2019-09-13 ENCOUNTER — Ambulatory Visit
Admission: RE | Admit: 2019-09-13 | Discharge: 2019-09-13 | Disposition: A | Discharge status: Home / Self Care | Payer: Medicare Other | Source: Ambulatory Visit | Attending: Family Medicine | Admitting: Family Medicine

## 2019-09-13 DIAGNOSIS — R519 Headache, unspecified: Secondary | ICD-10-CM

## 2019-09-13 DIAGNOSIS — R4189 Other symptoms and signs involving cognitive functions and awareness: Secondary | ICD-10-CM

## 2019-09-13 DIAGNOSIS — H547 Unspecified visual loss: Secondary | ICD-10-CM

## 2019-09-13 DIAGNOSIS — R413 Other amnesia: Secondary | ICD-10-CM

## 2019-09-13 MED ORDER — GADOBENATE DIMEGLUMINE 529 MG/ML IV SOLN
11.0000 mL | Freq: Once | INTRAVENOUS | Status: AC | PRN
  Administered 2019-09-13: 11 mL via INTRAVENOUS

## 2019-10-21 ENCOUNTER — Ambulatory Visit: Payer: Medicare Other | Attending: Internal Medicine

## 2019-10-21 DIAGNOSIS — Z23 Encounter for immunization: Secondary | ICD-10-CM | POA: Insufficient documentation

## 2019-10-21 NOTE — Progress Notes (Signed)
   Covid-19 Vaccination Clinic  Name:  Erika [name]    MRN: [id number] DOB: June 12, 1941  10/21/2019  [name] was observed post Covid-19 immunization for 15 minutes without incidence. She was provided with Vaccine Information Sheet and instruction to access the V-Safe system.   [name] was instructed to call 911 with any severe reactions post vaccine: Marland Kitchen Difficulty breathing  . Swelling of your face and throat  . A fast heartbeat  . A bad rash all over your body  . Dizziness and weakness    Immunizations Administered    Name Date Dose VIS Date Route   Pfizer COVID-19 Vaccine 10/21/2019  3:52 PM 0.3 mL 09/06/2019 Intramuscular   Manufacturer: [place]   Lot: [id number]   [place]: [id number]

## 2019-10-28 DIAGNOSIS — R519 Headache, unspecified: Secondary | ICD-10-CM | POA: Diagnosis not present

## 2019-10-28 DIAGNOSIS — F0391 Unspecified dementia with behavioral disturbance: Secondary | ICD-10-CM | POA: Diagnosis not present

## 2019-10-28 DIAGNOSIS — R55 Syncope and collapse: Secondary | ICD-10-CM | POA: Diagnosis not present

## 2019-10-28 DIAGNOSIS — R413 Other amnesia: Secondary | ICD-10-CM | POA: Diagnosis not present

## 2019-10-29 DIAGNOSIS — R4189 Other symptoms and signs involving cognitive functions and awareness: Secondary | ICD-10-CM | POA: Diagnosis not present

## 2019-10-29 DIAGNOSIS — D510 Vitamin B12 deficiency anemia due to intrinsic factor deficiency: Secondary | ICD-10-CM | POA: Diagnosis not present

## 2019-10-29 DIAGNOSIS — I10 Essential (primary) hypertension: Secondary | ICD-10-CM | POA: Diagnosis not present

## 2019-10-29 DIAGNOSIS — K219 Gastro-esophageal reflux disease without esophagitis: Secondary | ICD-10-CM | POA: Diagnosis not present

## 2019-10-31 ENCOUNTER — Other Ambulatory Visit (HOSPITAL_COMMUNITY): Payer: Self-pay | Admitting: Family Medicine

## 2019-10-31 ENCOUNTER — Other Ambulatory Visit: Payer: Self-pay | Admitting: Family Medicine

## 2019-10-31 DIAGNOSIS — R55 Syncope and collapse: Secondary | ICD-10-CM

## 2019-11-11 ENCOUNTER — Ambulatory Visit: Payer: Medicare Other | Attending: Internal Medicine

## 2019-11-11 ENCOUNTER — Other Ambulatory Visit: Payer: Self-pay | Admitting: Internal Medicine

## 2019-11-11 DIAGNOSIS — Z23 Encounter for immunization: Secondary | ICD-10-CM | POA: Insufficient documentation

## 2019-11-11 NOTE — Progress Notes (Signed)
   Covid-19 Vaccination Clinic  Name:  Shawntea Wantland Tri[place]i    MRN: [id number] DOB: 1940/11/25  11/11/2019  Ms. Nordgren was observed post Covid-19 immunization for 15 minutes without incidence. She was provided with Vaccine Information Sheet and instruction to access the V-Safe system.   Ms. Sellers was instructed to call 911 with any severe reactions post vaccine: Marland Kitchen Difficulty breathing  . Swelling of your face and throat  . A fast heartbeat  . A bad rash all over your body  . Dizziness and weakness    Immunizations Administered    Name Date Dose VIS Date Route   Pfizer COVID-19 Vaccine 11/11/2019  3:47 PM 0.3 mL 09/06/2019 Intramuscular   Manufacturer: [place]   Lot: [id number]*   [place]: [id number]

## 2019-11-13 ENCOUNTER — Ambulatory Visit (HOSPITAL_COMMUNITY): Payer: Medicare Other | Attending: Cardiology

## 2019-11-13 ENCOUNTER — Other Ambulatory Visit: Payer: Self-pay

## 2019-11-13 ENCOUNTER — Encounter (HOSPITAL_COMMUNITY): Payer: Medicare Other

## 2019-11-13 DIAGNOSIS — R55 Syncope and collapse: Secondary | ICD-10-CM | POA: Diagnosis not present

## 2019-11-15 ENCOUNTER — Ambulatory Visit (HOSPITAL_COMMUNITY)
Admission: RE | Admit: 2019-11-15 | Discharge: 2019-11-15 | Disposition: A | Discharge status: Home / Self Care | Payer: Medicare Other | Source: Ambulatory Visit | Attending: Cardiology | Admitting: Cardiology

## 2019-11-15 ENCOUNTER — Other Ambulatory Visit: Payer: Self-pay

## 2019-11-15 DIAGNOSIS — R55 Syncope and collapse: Secondary | ICD-10-CM | POA: Insufficient documentation

## 2019-12-06 ENCOUNTER — Ambulatory Visit: Payer: Medicare Other | Admitting: Cardiovascular Disease

## 2019-12-17 ENCOUNTER — Ambulatory Visit: Payer: Medicare Other | Admitting: Cardiovascular Disease

## 2019-12-19 ENCOUNTER — Encounter: Payer: Self-pay | Admitting: Neurology

## 2019-12-31 DIAGNOSIS — R519 Headache, unspecified: Secondary | ICD-10-CM | POA: Diagnosis not present

## 2019-12-31 DIAGNOSIS — F0391 Unspecified dementia with behavioral disturbance: Secondary | ICD-10-CM | POA: Diagnosis not present

## 2019-12-31 DIAGNOSIS — R413 Other amnesia: Secondary | ICD-10-CM | POA: Diagnosis not present

## 2019-12-31 DIAGNOSIS — E8770 Fluid overload, unspecified: Secondary | ICD-10-CM | POA: Diagnosis not present

## 2020-01-02 DIAGNOSIS — K219 Gastro-esophageal reflux disease without esophagitis: Secondary | ICD-10-CM | POA: Diagnosis not present

## 2020-01-02 DIAGNOSIS — R519 Headache, unspecified: Secondary | ICD-10-CM | POA: Diagnosis not present

## 2020-01-02 DIAGNOSIS — R7301 Impaired fasting glucose: Secondary | ICD-10-CM | POA: Diagnosis not present

## 2020-01-02 DIAGNOSIS — R413 Other amnesia: Secondary | ICD-10-CM | POA: Diagnosis not present

## 2020-01-02 DIAGNOSIS — E8770 Fluid overload, unspecified: Secondary | ICD-10-CM | POA: Diagnosis not present

## 2020-01-02 DIAGNOSIS — R4189 Other symptoms and signs involving cognitive functions and awareness: Secondary | ICD-10-CM | POA: Diagnosis not present

## 2020-01-02 DIAGNOSIS — D510 Vitamin B12 deficiency anemia due to intrinsic factor deficiency: Secondary | ICD-10-CM | POA: Diagnosis not present

## 2020-01-02 DIAGNOSIS — R601 Generalized edema: Secondary | ICD-10-CM | POA: Diagnosis not present

## 2020-01-02 DIAGNOSIS — I10 Essential (primary) hypertension: Secondary | ICD-10-CM | POA: Diagnosis not present

## 2020-01-02 DIAGNOSIS — F0391 Unspecified dementia with behavioral disturbance: Secondary | ICD-10-CM | POA: Diagnosis not present

## 2020-01-06 ENCOUNTER — Institutional Professional Consult (permissible substitution): Payer: Medicare Other | Admitting: Neurology

## 2020-01-14 IMAGING — RF DG UGI W/O KUB
6 of 7 series · 12 of 13 positions shown · IV contrast (omnipaque)
Comparison: 07/05/2017.

CLINICAL DATA: 77-year-old female with history of Nissen
fundoplication for repair of large hiatal hernia. Upper abdominal
pain. Evaluate for postoperative leak.

EXAM:
WATER SOLUBLE UPPER GI SERIES
TECHNIQUE: Single-column upper GI series was performed using water soluble
contrast.
CONTRAST:  75 mL of Omnipaque 300.

[Series 1: fluoro_barium 2fps_bw · 0.18mm/px · 1 of 1 slices shown (1 of 4)]
[im 1/1]
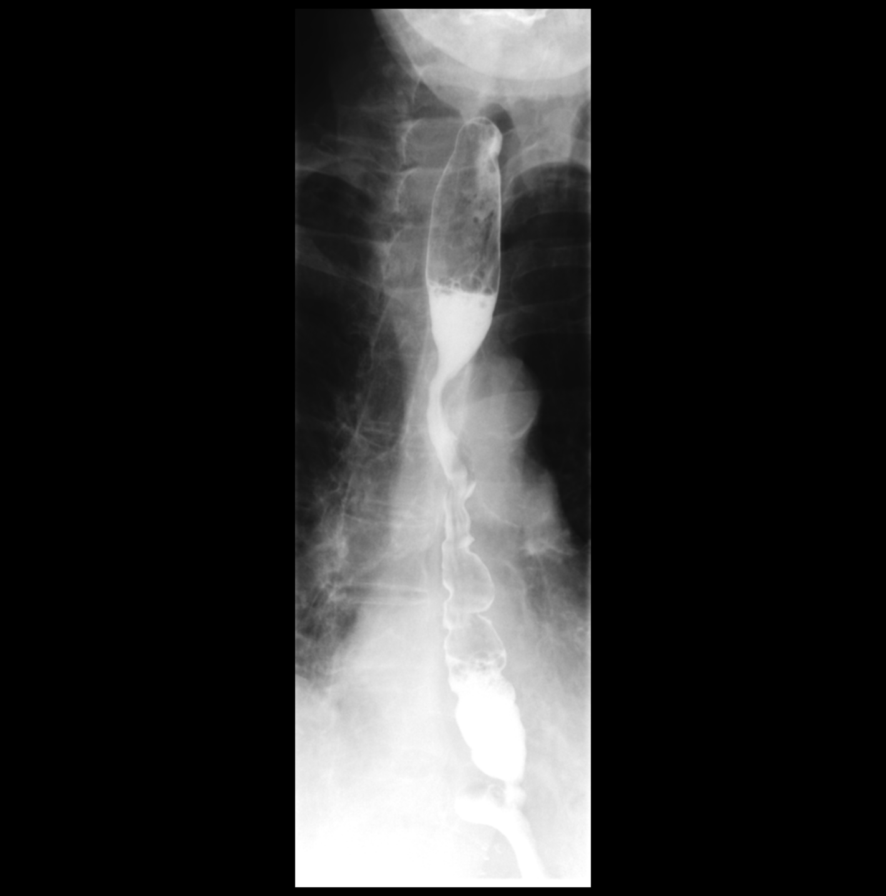

[Series 2: fluoro_barium 2fps_bw · 0.18mm/px · 1 of 1 slices shown (2 of 4)]
[im 1/1]
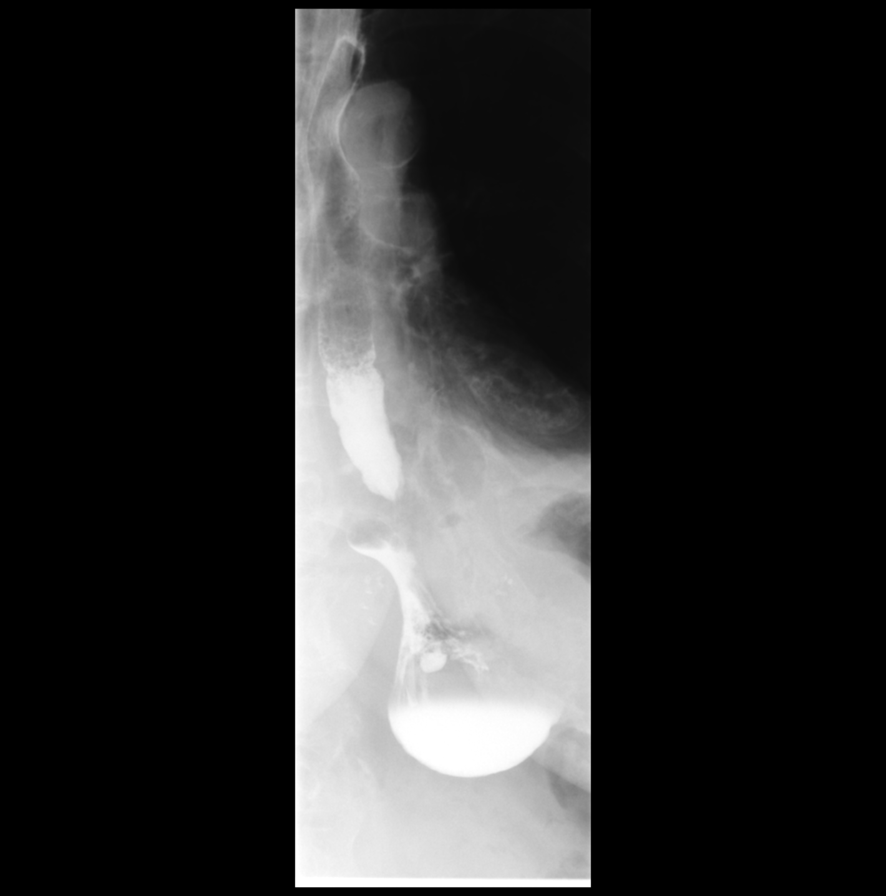

[Series 3: cp_standard · 0.55mm/px · 4 of 44 frames shown (1 of 2)]
[frame 7/44]
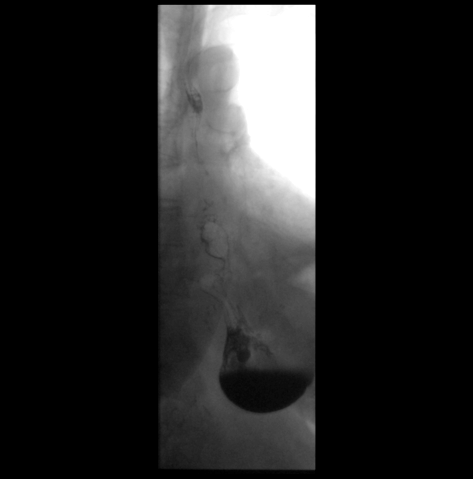
[frame 21/44]
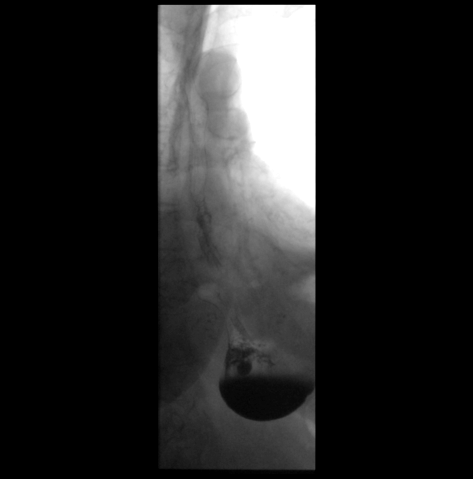
[frame 23/44]
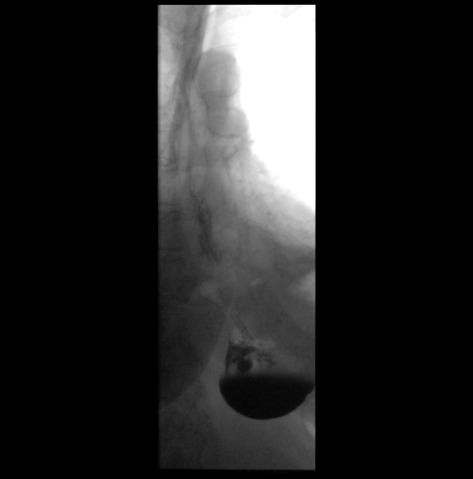
[frame 38/44]
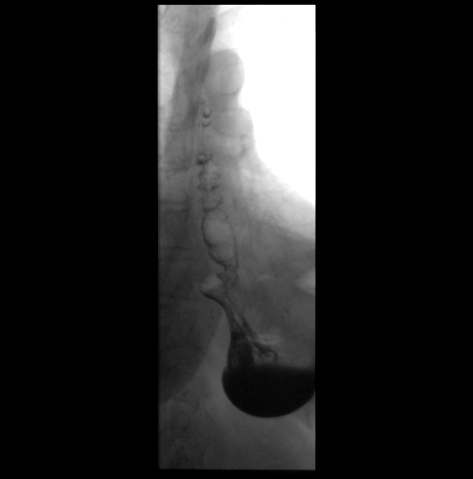

[Series 5: fluoro_barium 2fps_bw · 0.18mm/px · 1 of 1 slices shown (3 of 4)]
[im 1/1]
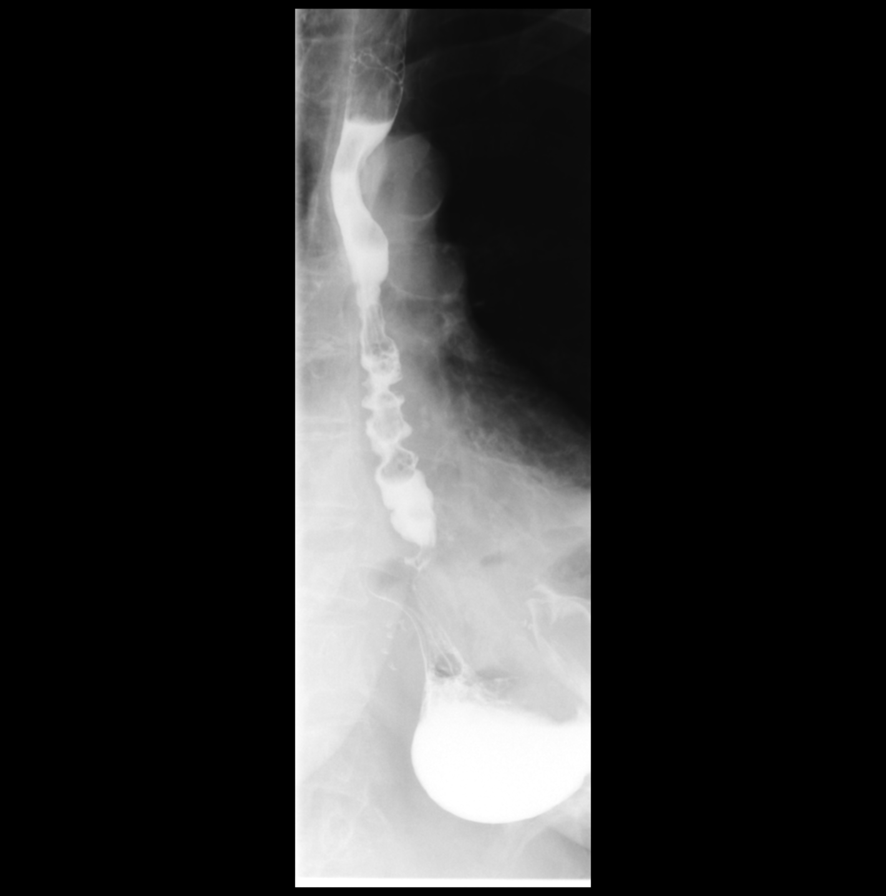

[Series 6: cp_standard · 0.55mm/px · 4 of 39 frames shown (2 of 2)]
[frame 6/39]
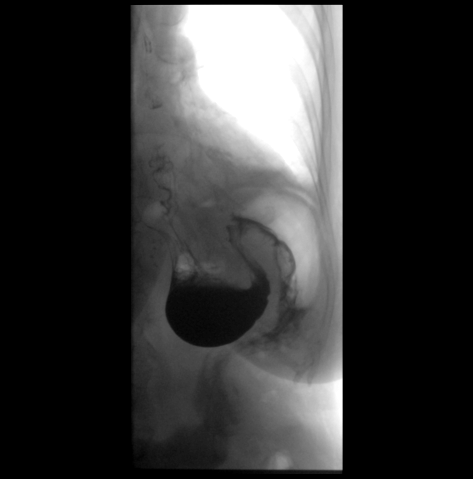
[frame 20/39]
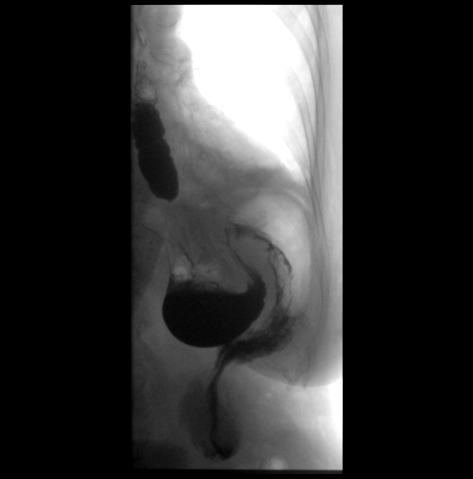
[frame 34/39]
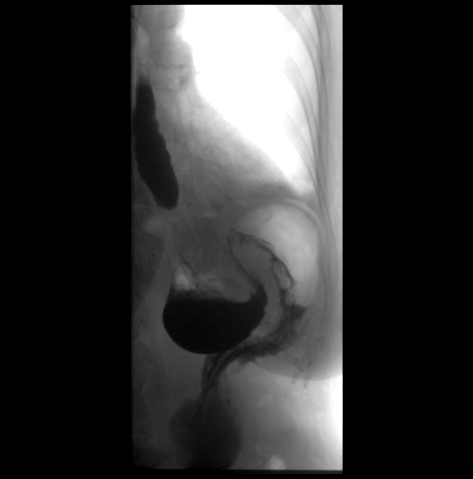
[frame 39/39]
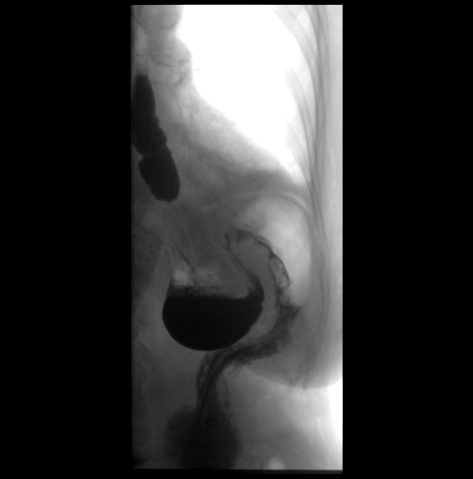

[Series 7: fluoro_barium 2fps_bw · 0.18mm/px · 1 of 1 slices shown (4 of 4)]
[im 1/1]
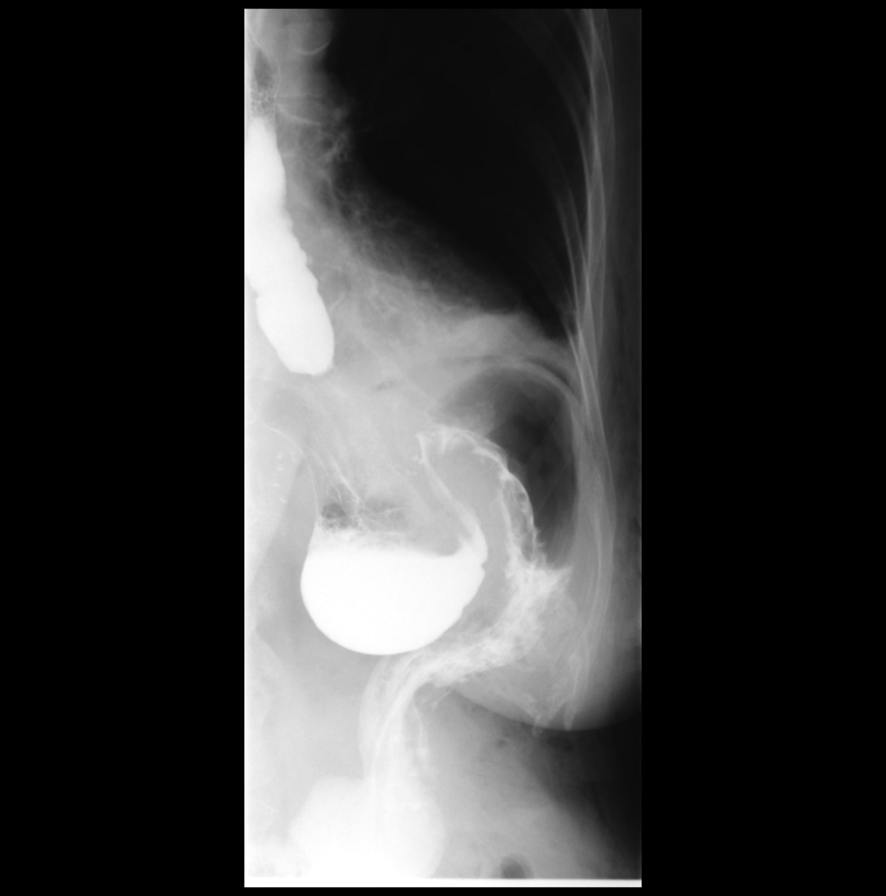

[12 of 13 positions shown; findings below may reference images not displayed]

FLUOROSCOPY TIME:  Fluoroscopy Time:  2.0 minutes

Radiation Exposure Index (if provided by the fluoroscopic device):
9.4 mGy
FINDINGS: Single contrast images of the esophagus demonstrated esophageal
dysmotility with extensive tertiary contractions. Postoperative
changes of Nissen fundoplication are noted. Contrast extended into
the stomach beneath wrap and was later observed traversing the
pylorus. No extravasation of contrast material was identified during
the examination. A small amount of pneumoperitoneum was evident,
presumably iatrogenic in the setting of recent surgery.
IMPRESSION: 1. Postoperative changes of Nissen fundoplication with no evidence
of extravasation of contrast material.
2. Small amount of pneumoperitoneum, presumably iatrogenic in the
setting of recent surgery.
3. Esophageal dysmotility with extensive tertiary contractions.

## 2020-01-23 ENCOUNTER — Ambulatory Visit (INDEPENDENT_AMBULATORY_CARE_PROVIDER_SITE_OTHER): Payer: Medicare Other | Admitting: Neurology

## 2020-01-23 ENCOUNTER — Encounter: Payer: Self-pay | Admitting: Neurology

## 2020-01-23 ENCOUNTER — Other Ambulatory Visit: Payer: Self-pay

## 2020-01-23 VITALS — BP 144/82 | HR 48 | Temp 96.7°F | Ht 63.5 in | Wt 129.5 lb

## 2020-01-23 DIAGNOSIS — R6889 Other general symptoms and signs: Secondary | ICD-10-CM | POA: Diagnosis not present

## 2020-01-23 DIAGNOSIS — F0390 Unspecified dementia without behavioral disturbance: Secondary | ICD-10-CM | POA: Insufficient documentation

## 2020-01-23 MED ORDER — LAMOTRIGINE 100 MG PO TABS: 100.0000 mg | ORAL_TABLET | Freq: Two times a day (BID) | ORAL | 11 refills | Status: DC

## 2020-01-23 MED ORDER — MEMANTINE HCL 5 MG PO TABS: 5.0000 mg | ORAL_TABLET | Freq: Two times a day (BID) | ORAL | 11 refills | Status: DC

## 2020-01-23 MED ORDER — LAMOTRIGINE 25 MG PO TABS
ORAL_TABLET | ORAL | 0 refills | Status: DC
Start: 2020-01-23 — End: 2020-07-27

## 2020-01-23 NOTE — Progress Notes (Signed)
PATIENT: Erika [name] DOB: Mealor 02, 1942  Chief Complaint  Patient presents with  . Visual Field Change    rm 4  dgtr- Erika [name] "headaches subsided after stoppping donepezil 1 monyh ago  . Headache  . Seizures     HISTORICAL  Erika [name] is a 79 year old female, seen in request by her primary care physician Dr. Darron Doom, Maebelle Munroe accompanied by her daughter Erika [name] at today's clinical visit on January 23, 2020.  I have reviewed and summarized the referring note from the referring physician.  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  Her parents died when she was young there was no clear history of dementia.  She was noted to have sudden onset memory loss, language difficulty, personality change since 2016.  Her daughter reported that she struggled forward, also get frustrated easily, especially with her husband, she is no longer driving, got lost, could not take care of her household bill anymore,  In addition, there was reported spells of sudden onset visual loss, confusion, staring into space since November 2020, it happened once to twice each month, sudden onset confusion, increased language difficulty, sometimes staring into space, lasting for couple minute, followed by fatigue, increased confusion afterwards  She was diagnosed with dementia, was started on Aricept since November 2020, while she was taking 5 mg, she seems tolerating it well, 1 it was increased to 10 mg in February 2021, she began to complains of constant head pressure at the top of her vertex region, which has improved within 1 week after stopping Aricept.  I personally reviewed MRI of the brain with without contrast in December 2020, no acute abnormality, generalized atrophy, mild to moderate supratentorium small vessel disease  Today's marker examination is only 8 out of 30  Lab  in Feb 2021, normal cbc, cmp, b12.folic acid  REVIEW OF SYSTEMS: Full 14 system review of systems performed and notable only for as above All other review of systems were negative.  ALLERGIES: No Known Allergies  HOME MEDICATIONS: Current Outpatient Medications  Medication Sig Dispense Refill  . alendronate (FOSAMAX) 70 MG tablet Take 1 tablet (70 mg total) by mouth every 7 (seven) days. Take with a full glass of water on an empty stomach. 12 tablet 4  . aspirin 81 MG tablet Take 1 tablet (81 mg total) by mouth daily. 90 tablet 1  . Calcium Carb-Cholecalciferol (CALCIUM 600+D) 600-800 MG-UNIT TABS Take 1 tablet by mouth every morning.    . carbonyl iron (FEOSOL) 45 MG TABS tablet Take 45 mg by mouth at bedtime.    . Cyanocobalamin (VITAMIN B 12 PO) Take 1,000 mcg by mouth every evening.     . furosemide (LASIX) 20 MG tablet Take 20 mg by mouth daily.    Marland Kitchen omeprazole (PRILOSEC) 40 MG capsule TAKE 1 CAPSULE BY MOUTH  DAILY WITH BREAKFAST 90 capsule 1  . Probiotic Product (HEALTHY COLON PO) Take 1 capsule by mouth daily.     Marland Kitchen losartan (COZAAR) 25 MG tablet Take 1 tablet (25 mg total) by mouth daily. (Patient not taking: Reported on 10/16/2018) 90 tablet 3   No current facility-administered medications for this visit.    PAST MEDICAL HISTORY: Past Medical History:  Diagnosis Date  . Absence of appendix, congenital   . Arthritis   . GERD (gastroesophageal reflux disease)   . Hiatal  hernia   . History of colon polyps   . History of gastric polyp   . Hypercholesterolemia   . Hypertension    PER PT NO MEDICINE  . Irritable bowel syndrome    resolved last few years  . Mobitz type 2 second degree heart block    intermittant,  cardiac work-up done;  Cardiac cath 10-02-2018,  echo 09-24-2018, event monitor 09-24-2018 (all results in epic)  . Neck pain    right sided neck pain  . OSA (obstructive sleep apnea)    does not use c-pap did not like  . Osteoporosis 06/2017   T score -2.8    . PSVT (paroxysmal supraventricular tachycardia) ([facility])    short run on event monitor 09-24-2018  . Wears glasses     PAST SURGICAL HISTORY: Past Surgical History:  Procedure Laterality Date  . CATARACT EXTRACTION W/ INTRAOCULAR LENS  IMPLANT, BILATERAL    . COLONOSCOPY    . cortisone shots in back    . ESOPHAGOGASTRODUODENOSCOPY    . ESOPHAGOGASTRODUODENOSCOPY N/A 08/14/2013   Procedure: ESOPHAGOGASTRODUODENOSCOPY (EGD);  Surgeon: Jeryl Columbia, MD;  Location: [facility];  Service: Endoscopy;  Laterality: N/A;  . ESOPHAGOGASTRODUODENOSCOPY (EGD) WITH PROPOFOL N/A 09/29/2015   Procedure: ESOPHAGOGASTRODUODENOSCOPY (EGD) WITH PROPOFOL;  Surgeon: Clarene Essex, MD;  Location: [facility];  Service: Endoscopy;  Laterality: N/A;  . FOOT SURGERY Right 06-30-2005   dr duda   osteotomy first and second toe's  . HIATAL HERNIA REPAIR N/A 10/29/2018   Procedure: LAPAROSCOPIC REPAIR OF HIATAL HERNIA WITH NISSEN FUNDOPLICATION, WITH MESH;  Surgeon: Excell Seltzer, MD;  Location: [facility];  Service: General;  Laterality: N/A;  . HOT HEMOSTASIS N/A 08/14/2013   Procedure: HOT HEMOSTASIS (ARGON PLASMA COAGULATION/BICAP);  Surgeon: Jeryl Columbia, MD;  Location: [facility];  Service: Endoscopy;  Laterality: N/A;  . HOT HEMOSTASIS N/A 09/29/2015   Procedure: HOT HEMOSTASIS (ARGON PLASMA COAGULATION/BICAP);  Surgeon: Clarene Essex, MD;  Location: [facility];  Service: Endoscopy;  Laterality: N/A;  . RIGHT/LEFT HEART CATH AND CORONARY ANGIOGRAPHY N/A 10/02/2018   Procedure: RIGHT/LEFT HEART CATH AND CORONARY ANGIOGRAPHY;  Surgeon: Troy Sine, MD;  Location: [facility];  Service: Cardiovascular;  Laterality: N/A;  . TUBAL LIGATION  yrs ago    FAMILY HISTORY: Family History  Problem Relation Age of Onset  . Heart disease Father        MI  . Kidney failure Mother        pregnant with twins  . Heart disease Paternal Uncle        MI  . Heart disease Paternal Uncle        MI  . Heart disease  Paternal Uncle        MI  . Heart disease Paternal Uncle        MI  . Colon cancer Neg Hx   . Liver cancer Neg Hx   . Esophageal cancer Neg Hx   . Rectal cancer Neg Hx   . Stomach cancer Neg Hx     SOCIAL HISTORY: Social History   Socioeconomic History  . Marital status: Married    Spouse name: Erika [name]  . Number of children: 1  . Years of education: 54  . Highest education level: Not on file  Occupational History    Employer: RETIRED    Comment: Retired  Tobacco Use  . Smoking status: Never Smoker  . Smokeless tobacco: Never Used  Substance and Sexual Activity  . Alcohol use: No  .  Drug use: No  . Sexual activity: Never    Comment: 1st intercourse 19 yo-1 partner  Other Topics Concern  . Not on file  Social History Narrative   . Advised patient of provider's approval for requested procedure, as well as any comments/instructions from provider.       Provided patient w/ verbal instructions concerning pre-, intra- and post-procedure preparation and instructions.      Patient verbalized understanding of the above.    Patient lives at home with her husband Erika [name]. Patient has a high school education. Patient is retired.   Right handed.   Caffeine-None   Social Determinants of Health   Financial Resource Strain:   . Difficulty of Paying Living Expenses:   Food Insecurity:   . Worried About Charity fundraiser in the Last Year:   . Arboriculturist in the Last Year:   Transportation Needs:   . Film/video editor (Medical):   Marland Kitchen Lack of Transportation (Non-Medical):   Physical Activity:   . Days of Exercise per Week:   . Minutes of Exercise per Session:   Stress:   . Feeling of Stress :   Social Connections:   . Frequency of Communication with Friends and Family:   . Frequency of Social Gatherings with Friends and Family:   . Attends Religious Services:   . Active Member of Clubs or Organizations:   . Attends Archivist Meetings:   Marland Kitchen Marital Status:     Intimate Partner Violence:   . Fear of Current or Ex-Partner:   . Emotionally Abused:   Marland Kitchen Physically Abused:   . Sexually Abused:      PHYSICAL EXAM   Vitals:   01/23/20 1401  BP: (!) 144/82  Pulse: (!) 48  Temp: (!) 96.7 F (35.9 C)  Weight: 129 lb 8 oz (58.7 kg)  Height: 5' 3.5" (1.613 m)    Not recorded      Body mass index is 22.58 kg/m.  PHYSICAL EXAMNIATION:  Gen: NAD, conversant, well nourised, well groomed                     Cardiovascular: Regular rate rhythm, no peripheral edema, warm, nontender. Eyes: Conjunctivae clear without exudates or hemorrhage Neck: Supple, no carotid bruits. Pulmonary: Clear to auscultation bilaterally   NEUROLOGICAL EXAM: Word finding difficulties,  following commands, occasionally get frustrated because of memory loss Montreal Cognitive Assessment  01/23/2020  Visuospatial/ Executive (0/5) 0  Naming (0/3) 1  Attention: Read list of digits (0/2) 2  Attention: Read list of letters (0/1) 0  Attention: Serial 7 subtraction starting at 100 (0/3) 0  Language: Repeat phrase (0/2) 1  Language : Fluency (0/1) 0  Abstraction (0/2) 0  Delayed Recall (0/5) 0  Orientation (0/6) 4  Total 8     CRANIAL NERVES: CN II: Visual fields are full to confrontation. Pupils are round equal and briskly reactive to light. CN III, IV, VI: extraocular movement are normal. No ptosis. CN V: Facial sensation is intact to light touch CN VII: Face is symmetric with normal eye closure  CN VIII: Mild hard of hearing, bilateral cerum CN IX, X: Phonation is normal. CN XI: Head turning and shoulder shrug are intact  MOTOR: There is no pronator drift of out-stretched arms. Muscle bulk and tone are normal. Muscle strength is normal.  REFLEXES: Reflexes are 2+ and symmetric at the biceps, triceps, knees, and ankles. Plantar responses are flexor.  SENSORY: Intact  to light touch, pinprick and vibratory sensation are intact in fingers and  toes.  COORDINATION: There is no trunk or limb dysmetria noted.  GAIT/STANCE: Need push-up to get up from seated position, mildly unsteady   DIAGNOSTIC DATA (LABS, IMAGING, TESTING) - I reviewed patient records, labs, notes, testing and imaging myself where available.   ASSESSMENT AND PLAN  Olayinka Gathers Hinckley is a 79 y.o. female    Dementia  MoCA examination is only 8 out of 26  Personally reviewed MRI of the brain December 2020, mild generalized atrophy, supratentorium small vessel disease  Laboratory evaluation TSH, inflammatory markers,  New onset headaches,  Improved after stopping Aricept, most likely due to side effect from Aricept,  ESR C-reactive protein to rule out temporal arteritis  Recurrent episode of transient visual loss, confusion, postevent confusion  Suggestive of partial seizure  EEG  Lamotrigine titrating to 100 mg twice a day  Marcial Pacas, M.D. Ph.D.  [facility] [address], [place], [place] [address] Ph: [phone] Fax: [phone]  CC: Hayden Rasmussen, MD

## 2020-01-24 LAB — TSH: TSH: 2.09 u[IU]/mL (ref 0.450–4.500)

## 2020-01-24 LAB — SEDIMENTATION RATE: Sed Rate: 3 mm/hr (ref 0–40)

## 2020-01-24 LAB — RPR: RPR Ser Ql: NONREACTIVE

## 2020-01-24 LAB — C-REACTIVE PROTEIN: CRP: <1 mg/L (ref 0–10)

## 2020-01-27 ENCOUNTER — Telehealth: Payer: Self-pay | Admitting: *Deleted

## 2020-01-27 NOTE — Telephone Encounter (Signed)
-----   Message from Marcial Pacas, MD sent at 01/27/2020 10:43 AM EDT ----- Please call patient for normal laboratory result

## 2020-01-27 NOTE — Telephone Encounter (Addendum)
Left message, reporting normal lab results, on voicemail. Provided our number to call back with any questions.

## 2020-02-02 ENCOUNTER — Inpatient Hospital Stay (HOSPITAL_COMMUNITY)
Admission: EM | Admit: 2020-02-02 | Discharge: 2020-02-04 | DRG: 243 | Disposition: A | Discharge status: Home / Self Care | Payer: Medicare Other | Attending: Cardiology | Admitting: Cardiology

## 2020-02-02 ENCOUNTER — Other Ambulatory Visit: Payer: Self-pay

## 2020-02-02 ENCOUNTER — Encounter (HOSPITAL_COMMUNITY): Payer: Self-pay | Admitting: Physician Assistant

## 2020-02-02 ENCOUNTER — Emergency Department (HOSPITAL_COMMUNITY): Payer: Medicare Other

## 2020-02-02 DIAGNOSIS — Z7983 Long term (current) use of bisphosphonates: Secondary | ICD-10-CM

## 2020-02-02 DIAGNOSIS — Z20822 Contact with and (suspected) exposure to covid-19: Secondary | ICD-10-CM | POA: Diagnosis not present

## 2020-02-02 DIAGNOSIS — K449 Diaphragmatic hernia without obstruction or gangrene: Secondary | ICD-10-CM | POA: Diagnosis not present

## 2020-02-02 DIAGNOSIS — E785 Hyperlipidemia, unspecified: Secondary | ICD-10-CM | POA: Diagnosis present

## 2020-02-02 DIAGNOSIS — I10 Essential (primary) hypertension: Secondary | ICD-10-CM | POA: Diagnosis not present

## 2020-02-02 DIAGNOSIS — R410 Disorientation, unspecified: Secondary | ICD-10-CM | POA: Diagnosis not present

## 2020-02-02 DIAGNOSIS — Z950 Presence of cardiac pacemaker: Secondary | ICD-10-CM | POA: Diagnosis not present

## 2020-02-02 DIAGNOSIS — M810 Age-related osteoporosis without current pathological fracture: Secondary | ICD-10-CM | POA: Diagnosis present

## 2020-02-02 DIAGNOSIS — Z7982 Long term (current) use of aspirin: Secondary | ICD-10-CM

## 2020-02-02 DIAGNOSIS — I4510 Unspecified right bundle-branch block: Secondary | ICD-10-CM | POA: Diagnosis present

## 2020-02-02 DIAGNOSIS — Z8249 Family history of ischemic heart disease and other diseases of the circulatory system: Secondary | ICD-10-CM

## 2020-02-02 DIAGNOSIS — I4430 Unspecified atrioventricular block: Secondary | ICD-10-CM | POA: Diagnosis not present

## 2020-02-02 DIAGNOSIS — F0391 Unspecified dementia with behavioral disturbance: Secondary | ICD-10-CM | POA: Diagnosis not present

## 2020-02-02 DIAGNOSIS — I442 Atrioventricular block, complete: Principal | ICD-10-CM | POA: Diagnosis present

## 2020-02-02 DIAGNOSIS — E7800 Pure hypercholesterolemia, unspecified: Secondary | ICD-10-CM | POA: Diagnosis present

## 2020-02-02 DIAGNOSIS — Z841 Family history of disorders of kidney and ureter: Secondary | ICD-10-CM

## 2020-02-02 DIAGNOSIS — R296 Repeated falls: Secondary | ICD-10-CM | POA: Diagnosis not present

## 2020-02-02 DIAGNOSIS — W19XXXA Unspecified fall, initial encounter: Secondary | ICD-10-CM

## 2020-02-02 DIAGNOSIS — R4189 Other symptoms and signs involving cognitive functions and awareness: Secondary | ICD-10-CM | POA: Diagnosis not present

## 2020-02-02 DIAGNOSIS — R55 Syncope and collapse: Secondary | ICD-10-CM | POA: Diagnosis not present

## 2020-02-02 DIAGNOSIS — M1990 Unspecified osteoarthritis, unspecified site: Secondary | ICD-10-CM | POA: Diagnosis present

## 2020-02-02 DIAGNOSIS — G4733 Obstructive sleep apnea (adult) (pediatric): Secondary | ICD-10-CM | POA: Diagnosis present

## 2020-02-02 DIAGNOSIS — K219 Gastro-esophageal reflux disease without esophagitis: Secondary | ICD-10-CM | POA: Diagnosis not present

## 2020-02-02 DIAGNOSIS — R519 Headache, unspecified: Secondary | ICD-10-CM | POA: Diagnosis not present

## 2020-02-02 DIAGNOSIS — J9811 Atelectasis: Secondary | ICD-10-CM | POA: Diagnosis not present

## 2020-02-02 DIAGNOSIS — R05 Cough: Secondary | ICD-10-CM | POA: Diagnosis not present

## 2020-02-02 DIAGNOSIS — R569 Unspecified convulsions: Secondary | ICD-10-CM | POA: Diagnosis not present

## 2020-02-02 DIAGNOSIS — R0902 Hypoxemia: Secondary | ICD-10-CM | POA: Diagnosis not present

## 2020-02-02 DIAGNOSIS — G4489 Other headache syndrome: Secondary | ICD-10-CM | POA: Diagnosis not present

## 2020-02-02 DIAGNOSIS — R413 Other amnesia: Secondary | ICD-10-CM | POA: Diagnosis not present

## 2020-02-02 HISTORY — DX: Atrioventricular block, complete: I44.2

## 2020-02-02 LAB — URINALYSIS, ROUTINE W REFLEX MICROSCOPIC
Bacteria, UA: NONE SEEN
Bilirubin Urine: NEGATIVE
Glucose, UA: NEGATIVE mg/dL
Hgb urine dipstick: NEGATIVE
Ketones, ur: 5 mg/dL — AB
Leukocytes,Ua: NEGATIVE
Nitrite: NEGATIVE
Protein, ur: 100 mg/dL — AB
Specific Gravity, Urine: 1.012 (ref 1.005–1.030)
pH: 7 (ref 5.0–8.0)

## 2020-02-02 LAB — COMPREHENSIVE METABOLIC PANEL
ALT: 12 U/L (ref 0–44)
AST: 22 U/L (ref 15–41)
Albumin: 4.2 g/dL (ref 3.5–5.0)
Alkaline Phosphatase: 51 U/L (ref 38–126)
Anion gap: 14 (ref 5–15)
BUN: 25 mg/dL — ABNORMAL HIGH (ref 8–23)
CO2: 24 mmol/L (ref 22–32)
Calcium: 9.7 mg/dL (ref 8.9–10.3)
Chloride: 103 mmol/L (ref 98–111)
Creatinine, Ser: 1.12 mg/dL — ABNORMAL HIGH (ref 0.44–1.00)
GFR calc Af Amer: 54 mL/min — ABNORMAL LOW (ref >60)
GFR calc non Af Amer: 46 mL/min — ABNORMAL LOW (ref >60)
Glucose, Bld: 113 mg/dL — ABNORMAL HIGH (ref 70–99)
Potassium: 4.4 mmol/L (ref 3.5–5.1)
Sodium: 141 mmol/L (ref 135–145)
Total Bilirubin: 0.8 mg/dL (ref 0.3–1.2)
Total Protein: 7.2 g/dL (ref 6.5–8.1)

## 2020-02-02 LAB — CBC WITH DIFFERENTIAL/PLATELET
Abs Immature Granulocytes: 0.05 10*3/uL (ref 0.00–0.07)
Basophils Absolute: 0.1 10*3/uL (ref 0.0–0.1)
Basophils Relative: 1 %
Eosinophils Absolute: 0.1 10*3/uL (ref 0.0–0.5)
Eosinophils Relative: 1 %
HCT: 41.8 % (ref 36.0–46.0)
Hemoglobin: 12.6 g/dL (ref 12.0–15.0)
Immature Granulocytes: 1 %
Lymphocytes Relative: 15 %
Lymphs Abs: 1.7 10*3/uL (ref 0.7–4.0)
MCH: 27.5 pg (ref 26.0–34.0)
MCHC: 30.1 g/dL (ref 30.0–36.0)
MCV: 91.1 fL (ref 80.0–100.0)
Monocytes Absolute: 0.5 10*3/uL (ref 0.1–1.0)
Monocytes Relative: 5 %
Neutro Abs: 8.4 10*3/uL — ABNORMAL HIGH (ref 1.7–7.7)
Neutrophils Relative %: 77 %
Platelets: 234 10*3/uL (ref 150–400)
RBC: 4.59 MIL/uL (ref 3.87–5.11)
RDW: 13.1 % (ref 11.5–15.5)
WBC: 10.7 10*3/uL — ABNORMAL HIGH (ref 4.0–10.5)
nRBC: 0 % (ref 0.0–0.2)

## 2020-02-02 LAB — TROPONIN I (HIGH SENSITIVITY)
Troponin I (High Sensitivity): 22 ng/L — ABNORMAL HIGH (ref <18)
Troponin I (High Sensitivity): 29 ng/L — ABNORMAL HIGH (ref <18)

## 2020-02-02 LAB — TSH: TSH: 1.63 u[IU]/mL (ref 0.350–4.500)

## 2020-02-02 LAB — RESPIRATORY PANEL BY RT PCR (FLU A&B, COVID)
Influenza A by PCR: NEGATIVE
Influenza B by PCR: NEGATIVE
SARS Coronavirus 2 by RT PCR: NEGATIVE

## 2020-02-02 LAB — HEMOGLOBIN A1C
Hgb A1c MFr Bld: 5.6 % (ref 4.8–5.6)
Mean Plasma Glucose: 114.02 mg/dL

## 2020-02-02 LAB — MAGNESIUM: Magnesium: 1.9 mg/dL (ref 1.7–2.4)

## 2020-02-02 MED ORDER — CHLORHEXIDINE GLUCONATE 4 % EX LIQD
60.0000 mL | Freq: Once | CUTANEOUS | Status: AC
  Administered 2020-02-03: 4 via TOPICAL
  Filled 2020-02-02: qty 60

## 2020-02-02 MED ORDER — LOSARTAN POTASSIUM 25 MG PO TABS
25.0000 mg | ORAL_TABLET | Freq: Every day | ORAL | Status: DC
  Administered 2020-02-04: 25 mg via ORAL
  Filled 2020-02-02: qty 1

## 2020-02-02 MED ORDER — LAMOTRIGINE 100 MG PO TABS
50.0000 mg | ORAL_TABLET | Freq: Two times a day (BID) | ORAL | Status: DC
  Administered 2020-02-02 – 2020-02-04 (×4): 50 mg via ORAL
  Filled 2020-02-02: qty 1
  Filled 2020-02-02: qty 2
  Filled 2020-02-02 (×2): qty 1
  Filled 2020-02-02: qty 2

## 2020-02-02 MED ORDER — MEMANTINE HCL 10 MG PO TABS
5.0000 mg | ORAL_TABLET | Freq: Two times a day (BID) | ORAL | Status: DC
  Administered 2020-02-02 – 2020-02-04 (×4): 5 mg via ORAL
  Filled 2020-02-02 (×5): qty 1

## 2020-02-02 MED ORDER — ASPIRIN EC 81 MG PO TBEC
81.0000 mg | DELAYED_RELEASE_TABLET | Freq: Every day | ORAL | Status: DC
  Administered 2020-02-02 – 2020-02-04 (×2): 81 mg via ORAL
  Filled 2020-02-02 (×2): qty 1

## 2020-02-02 MED ORDER — SODIUM CHLORIDE 0.9 % IV SOLN
80.0000 mg | INTRAVENOUS | Status: AC
  Administered 2020-02-03: 80 mg
  Filled 2020-02-02: qty 2

## 2020-02-02 MED ORDER — DOBUTAMINE IN D5W 4-5 MG/ML-% IV SOLN
2.5000 ug/kg/min | INTRAVENOUS | Status: DC
  Administered 2020-02-02: 2.5 ug/kg/min via INTRAVENOUS
  Administered 2020-02-03: 17.6 ug/kg/min via INTRAVENOUS
  Filled 2020-02-02 (×2): qty 250

## 2020-02-02 MED ORDER — SODIUM CHLORIDE 0.9% FLUSH: 3.0000 mL | INTRAVENOUS | Status: DC | PRN

## 2020-02-02 MED ORDER — SODIUM CHLORIDE 0.9 % IV SOLN: INTRAVENOUS | Status: DC

## 2020-02-02 MED ORDER — SODIUM CHLORIDE 0.9% FLUSH
3.0000 mL | Freq: Two times a day (BID) | INTRAVENOUS | Status: DC
  Administered 2020-02-04: 3 mL via INTRAVENOUS

## 2020-02-02 MED ORDER — ALPRAZOLAM 0.25 MG PO TABS
0.2500 mg | ORAL_TABLET | Freq: Two times a day (BID) | ORAL | Status: DC | PRN
  Administered 2020-02-03: 0.25 mg via ORAL
  Filled 2020-02-02: qty 1

## 2020-02-02 MED ORDER — PANTOPRAZOLE SODIUM 40 MG PO TBEC
40.0000 mg | DELAYED_RELEASE_TABLET | Freq: Every day | ORAL | Status: DC
  Administered 2020-02-02 – 2020-02-04 (×2): 40 mg via ORAL
  Filled 2020-02-02 (×2): qty 1

## 2020-02-02 MED ORDER — CEFAZOLIN SODIUM-DEXTROSE 2-4 GM/100ML-% IV SOLN
2.0000 g | INTRAVENOUS | Status: AC
  Administered 2020-02-03: 2 g via INTRAVENOUS

## 2020-02-02 MED ORDER — CALCIUM CARBONATE-VITAMIN D 500-200 MG-UNIT PO TABS
1.0000 | ORAL_TABLET | Freq: Every morning | ORAL | Status: DC
  Administered 2020-02-02 – 2020-02-04 (×2): 1 via ORAL
  Filled 2020-02-02 (×2): qty 1

## 2020-02-02 MED ORDER — ZOLPIDEM TARTRATE 5 MG PO TABS
5.0000 mg | ORAL_TABLET | Freq: Every evening | ORAL | Status: DC | PRN
  Administered 2020-02-02: 5 mg via ORAL
  Filled 2020-02-02: qty 1

## 2020-02-02 MED ORDER — VITAMIN B-12 1000 MCG PO TABS
1000.0000 ug | ORAL_TABLET | Freq: Every evening | ORAL | Status: DC
  Administered 2020-02-02 – 2020-02-03 (×2): 1000 ug via ORAL
  Filled 2020-02-02 (×2): qty 1

## 2020-02-02 MED ORDER — ACETAMINOPHEN 325 MG PO TABS
650.0000 mg | ORAL_TABLET | ORAL | Status: DC | PRN
  Administered 2020-02-02 – 2020-02-03 (×3): 650 mg via ORAL
  Filled 2020-02-02 (×3): qty 2

## 2020-02-02 MED ORDER — ONDANSETRON HCL 4 MG/2ML IJ SOLN
4.0000 mg | Freq: Four times a day (QID) | INTRAMUSCULAR | Status: DC | PRN
  Administered 2020-02-03: 4 mg via INTRAVENOUS
  Filled 2020-02-02: qty 2

## 2020-02-02 MED ORDER — ENOXAPARIN SODIUM 40 MG/0.4ML ~~LOC~~ SOLN
40.0000 mg | SUBCUTANEOUS | Status: DC
  Filled 2020-02-02 (×2): qty 0.4

## 2020-02-02 MED ORDER — SODIUM CHLORIDE 0.9% FLUSH: 3.0000 mL | Freq: Two times a day (BID) | INTRAVENOUS | Status: DC

## 2020-02-02 MED ORDER — CHLORHEXIDINE GLUCONATE 4 % EX LIQD
60.0000 mL | Freq: Once | CUTANEOUS | Status: AC
  Administered 2020-02-02: 4 via TOPICAL

## 2020-02-02 MED ORDER — NITROGLYCERIN 0.4 MG SL SUBL: 0.4000 mg | SUBLINGUAL_TABLET | SUBLINGUAL | Status: DC | PRN

## 2020-02-02 MED ORDER — SODIUM CHLORIDE 0.9 % IV SOLN: 250.0000 mL | INTRAVENOUS | Status: DC | PRN

## 2020-02-02 NOTE — ED Notes (Signed)
This RN paged the Admitting provider to inform them of this pts VS including their HR in the 30-40s; awaiting for return call.

## 2020-02-02 NOTE — ED Provider Notes (Signed)
Emergency Department Provider Note   I have reviewed the triage vital signs and the nursing notes.   HISTORY  Chief Complaint Fall and Seizures   HPI Erika [name] is a 79 y.o. female with past medical history reviewed below including recent diagnosis of seizure on Lamictal presents to the emergency department for evaluation of multiple falls today.  Patient has dementia which limits the history.  Level 5 caveat applies.  EMS state that the patient has recently been diagnosed with seizures and started on Lamictal.  Apparently the family is unsure if she is been taking it.  She had multiple falls today and on the third fall appear to have had a short period of seizure-like activity.  No obvious head injury.  The family called EMS and patient was found to be in heart block with elevated blood pressures to the 200s.  Other than the Lamictal the patient has not been started on new medications.  She is reporting some headache but otherwise not having pain or other symptoms.   Past Medical History:  Diagnosis Date  . Absence of appendix, congenital   . Arthritis   . Complete heart block ([place]) 02/02/2020  . GERD (gastroesophageal reflux disease)   . Hiatal hernia   . History of colon polyps   . History of gastric polyp   . Hypercholesterolemia   . Hypertension    PER PT NO MEDICINE  . Irritable bowel syndrome    resolved last few years  . Mobitz type 2 second degree heart block    intermittant,  cardiac work-up done;  Cardiac cath 10-02-2018,  echo 09-24-2018, event monitor 09-24-2018 (all results in epic)  . Neck pain    right sided neck pain  . OSA (obstructive sleep apnea)    does not use c-pap did not like  . Osteoporosis 06/2017   T score -2.8  . PSVT (paroxysmal supraventricular tachycardia) ([place])    short run on event monitor 09-24-2018  . Wears glasses     Patient Active Problem List   Diagnosis Date Noted  . Complete heart block ([place]) 02/02/2020  . Spells of decreased  attentiveness 01/23/2020  . Dementia without behavioral disturbance ([place]) 01/23/2020  . Hiatal hernia with obstruction but no gangrene 10/29/2018  . Exertional dyspnea   . Chest pain   . Second degree AV block, Mobitz type II 09/10/2018  . Pre-operative cardiovascular examination 09/10/2018  . Neck pain on right side 05/13/2013  . Right shoulder pain 04/23/2013  . Hyperlipidemia 08/28/2012  . Irritable bowel syndrome 08/28/2012    Past Surgical History:  Procedure Laterality Date  . CATARACT EXTRACTION W/ INTRAOCULAR LENS  IMPLANT, BILATERAL    . COLONOSCOPY    . cortisone shots in back    . ESOPHAGOGASTRODUODENOSCOPY    . ESOPHAGOGASTRODUODENOSCOPY N/A 08/14/2013   Procedure: ESOPHAGOGASTRODUODENOSCOPY (EGD);  Surgeon: Jeryl Columbia, MD;  Location: [facility];  Service: Endoscopy;  Laterality: N/A;  . ESOPHAGOGASTRODUODENOSCOPY (EGD) WITH PROPOFOL N/A 09/29/2015   Procedure: ESOPHAGOGASTRODUODENOSCOPY (EGD) WITH PROPOFOL;  Surgeon: Clarene Essex, MD;  Location: [facility];  Service: Endoscopy;  Laterality: N/A;  . FOOT SURGERY Right 06-30-2005   dr duda   osteotomy first and second toe's  . HIATAL HERNIA REPAIR N/A 10/29/2018   Procedure: LAPAROSCOPIC REPAIR OF HIATAL HERNIA WITH NISSEN FUNDOPLICATION, WITH MESH;  Surgeon: Excell Seltzer, MD;  Location: [facility];  Service: General;  Laterality: N/A;  . HOT HEMOSTASIS N/A 08/14/2013   Procedure: HOT HEMOSTASIS (ARGON PLASMA  COAGULATION/BICAP);  Surgeon: Jeryl Columbia, MD;  Location: [facility];  Service: Endoscopy;  Laterality: N/A;  . HOT HEMOSTASIS N/A 09/29/2015   Procedure: HOT HEMOSTASIS (ARGON PLASMA COAGULATION/BICAP);  Surgeon: Clarene Essex, MD;  Location: [facility];  Service: Endoscopy;  Laterality: N/A;  . RIGHT/LEFT HEART CATH AND CORONARY ANGIOGRAPHY N/A 10/02/2018   Procedure: RIGHT/LEFT HEART CATH AND CORONARY ANGIOGRAPHY;  Surgeon: Troy Sine, MD;  Location: [facility];  Service: Cardiovascular;  Laterality:  N/A;  . TUBAL LIGATION  yrs ago    Allergies Patient has no known allergies.  Family History  Problem Relation Age of Onset  . Heart disease Father        MI  . Kidney failure Mother        pregnant with twins  . Heart disease Paternal Uncle        MI  . Heart disease Paternal Uncle        MI  . Heart disease Paternal Uncle        MI  . Heart disease Paternal Uncle        MI  . Colon cancer Neg Hx   . Liver cancer Neg Hx   . Esophageal cancer Neg Hx   . Rectal cancer Neg Hx   . Stomach cancer Neg Hx     Social History Social History   Tobacco Use  . Smoking status: Never Smoker  . Smokeless tobacco: Never Used  Substance Use Topics  . Alcohol use: No  . Drug use: No    Review of Systems  Constitutional: No fever/chills. Multiple falls.  Eyes: No visual changes. ENT: No sore throat. Cardiovascular: Denies chest pain. Respiratory: Denies shortness of breath. Gastrointestinal: No abdominal pain.  No nausea, no vomiting.  No diarrhea.  No constipation. Genitourinary: Negative for dysuria. Musculoskeletal: Negative for back pain. Skin: Negative for rash. Neurological: Negative for focal weakness or numbness. Positive HA.   10-point ROS otherwise negative.  ____________________________________________   PHYSICAL EXAM:  VITAL SIGNS: ED Triage Vitals  Enc Vitals Group     BP --      Pulse Rate 02/02/20 1317 (!) 30     Resp 02/02/20 1317 16     Temp 02/02/20 1317 98.6 F (37 C)     Temp Source 02/02/20 1317 Oral     SpO2 02/02/20 1314 96 %   Constitutional: Alert and conversational but confused (baseline per EMS). Well appearing and in no acute distress. Eyes: Conjunctivae are normal.  Head: Atraumatic. Nose: No congestion/rhinnorhea. Mouth/Throat: Mucous membranes are moist.   Neck: No stridor.  Cardiovascular: Bradycardia. Good peripheral circulation. Grossly normal heart sounds.   Respiratory: Normal respiratory effort.  No retractions. Lungs  CTAB. Gastrointestinal: Soft and nontender. No distention.  Musculoskeletal: No lower extremity tenderness nor edema. No gross deformities of extremities. Neurologic:  Normal speech and language. No gross focal neurologic deficits are appreciated. No facial asymmetry. Normal strength and sensation in the bilateral upper and lower extremities.  Skin:  Skin is warm, dry and intact. No rash noted.   ____________________________________________   LABS (all labs ordered are listed, but only abnormal results are displayed)  Labs Reviewed  COMPREHENSIVE METABOLIC PANEL - Abnormal; Notable for the following components:      Result Value   Glucose, Bld 113 (*)    BUN 25 (*)    Creatinine, Ser 1.12 (*)    GFR calc non Af Amer 46 (*)    GFR calc Af Wyvonnia Lora  54 (*)    All other components within normal limits  CBC WITH DIFFERENTIAL/PLATELET - Abnormal; Notable for the following components:   WBC 10.7 (*)    Neutro Abs 8.4 (*)    All other components within normal limits  TROPONIN I (HIGH SENSITIVITY) - Abnormal; Notable for the following components:   Troponin I (High Sensitivity) 22 (*)    All other components within normal limits  URINE CULTURE  RESPIRATORY PANEL BY RT PCR (FLU A&B, COVID)  TSH  MAGNESIUM  URINALYSIS, ROUTINE W REFLEX MICROSCOPIC  TROPONIN I (HIGH SENSITIVITY)   ____________________________________________  EKG   EKG Interpretation  Date/Time:  Sunday [place] 09 2021 13:14:19 EDT Ventricular Rate:  28 PR Interval:    QRS Duration: 166 QT Interval:  654 QTC Calculation: 446 R Axis:   60 Text Interpretation: Complete (3-degree) AV block Right atrial enlargement Right bundle branch block No STEMI Confirmed by Nanda Quinton [phone]) on 02/02/2020 3:13:08 PM       ____________________________________________  RADIOLOGY  CT Head Wo Contrast  Result Date: 02/02/2020 CLINICAL DATA:  Seizure, nontraumatic. Additional history provided: Patient with history of dementia, 3  falls today, patient with reported seizure following the most recent fall, increased confusion over the last few months EXAM: CT HEAD WITHOUT CONTRAST TECHNIQUE: Contiguous axial images were obtained from the base of the skull through the vertex without intravenous contrast. COMPARISON:  Brain MRI 09/13/2019, head CT 09/09/2014 FINDINGS: Brain: Mild chronic small vessel ischemic changes were better appreciated on prior MRI 09/13/2019. Stable, mild generalized parenchymal atrophy. There is no acute intracranial hemorrhage. No demarcated cortical infarct. No extra-axial fluid collection. No evidence of intracranial mass. No midline shift. Vascular: No hyperdense vessel.  Atherosclerotic calcifications. Skull: Normal. Negative for fracture or focal lesion. Sinuses/Orbits: Visualized orbits show no acute finding. Mild ethmoid and maxillary sinus mucosal thickening. No significant mastoid effusion. IMPRESSION: 1. No evidence of acute intracranial abnormality. 2. Mild generalized parenchymal atrophy and chronic small vessel ischemic disease. 3. Mild ethmoid and maxillary sinus mucosal thickening. Electronically Signed   By: Kellie Simmering DO   On: 02/02/2020 14:25   DG Chest Portable 1 View  Result Date: 02/02/2020 CLINICAL DATA:  Cough with recent falls EXAM: PORTABLE CHEST 1 VIEW COMPARISON:  August 28, 2012 FINDINGS: There is slight left base atelectasis. Lungs are otherwise clear. Heart is mildly enlarged with pulmonary vascularity normal. No adenopathy. There is aortic atherosclerosis. Previously noted hiatal hernia is not appreciable on current examination. IMPRESSION: Slight left base atelectasis. No edema or airspace opacity. Mild cardiomegaly. No adenopathy. Aortic Atherosclerosis (ICD10-I70.0). Electronically Signed   By: Lowella Grip III M.D.   On: 02/02/2020 14:09    ____________________________________________   PROCEDURES  Procedure(s) performed:   .Critical Care Performed by: Margette Fast, MD Authorized by: Margette Fast, MD   Critical care provider statement:    Critical care time (minutes):  35   Critical care time was exclusive of:  Separately billable procedures and treating other patients and teaching time   Critical care was necessary to treat or prevent imminent or life-threatening deterioration of the following conditions:  Circulatory failure   Critical care was time spent personally by me on the following activities:  Discussions with consultants, evaluation of patient's response to treatment, examination of patient, ordering and performing treatments and interventions, ordering and review of laboratory studies, ordering and review of radiographic studies, pulse oximetry, re-evaluation of patient's condition, obtaining history from patient or surrogate, review of old  charts, blood draw for specimens and development of treatment plan with patient or surrogate   I assumed direction of critical care for this patient from another provider in my specialty: no      ____________________________________________   INITIAL IMPRESSION / ASSESSMENT AND PLAN / ED COURSE  Pertinent labs & imaging results that were available during my care of the patient were reviewed by me and considered in my medical decision making (see chart for details).   Patient presents to the emergency department with multiple falls.  She is at her mental status baseline.  She is newly diagnosed with seizure and on Lamictal.  No focal deficits here.  On arrival found to be in complete heart block confirmed on her EKG here.  No clear symptoms and blood pressure actually elevated.  Normal range of motion of all extremities and no tongue trauma from seizure activity.  Continue Lamictal but no concern for prolonged postictal phase or subclinical status.   Consulted cardiology immediately given the EKG findings and they will be down to see the patient and likely admit.   Discussed patient's case with  Cardiology to request admission. Patient and family (if present) updated with plan. Care transferred to Cardiology service.  I reviewed all nursing notes, vitals, pertinent old records, EKGs, labs, imaging (as available).  ____________________________________________  FINAL CLINICAL IMPRESSION(S) / ED DIAGNOSES  Final diagnoses:  AV block, complete ([place])  Fall in home, initial encounter    MEDICATIONS GIVEN DURING THIS VISIT:  Medications  aspirin EC tablet 81 mg (has no administration in time range)  calcium-vitamin D (OSCAL WITH D) 500-200 MG-UNIT per tablet 1 tablet (has no administration in time range)  DOBUTamine (DOBUTREX) infusion 4000 mcg/mL (has no administration in time range)  vitamin B-12 (CYANOCOBALAMIN) tablet 1,000 mcg (has no administration in time range)  lamoTRIgine (LAMICTAL) tablet 50 mg (has no administration in time range)  losartan (COZAAR) tablet 25 mg (has no administration in time range)  memantine (NAMENDA) tablet 5 mg (has no administration in time range)  pantoprazole (PROTONIX) EC tablet 40 mg (has no administration in time range)    Note:  This document was prepared using Dragon voice recognition software and Grosz include unintentional dictation errors.  Nanda Quinton, MD, [facility] Emergency Medicine    Honi Name, Wonda Olds, MD 02/02/20 [phone]

## 2020-02-02 NOTE — ED Notes (Signed)
Pt's daughter, Loni Muse, [phone]. Call with updates

## 2020-02-02 NOTE — Progress Notes (Signed)
   02/02/20 2200  Assess: MEWS Score  BP (!) 157/40  Pulse Rate (!) 39  ECG Heart Rate (!) 38  Resp 15  SpO2 95 %  Assess: MEWS Score  MEWS Temp 0  MEWS Systolic 0  MEWS Pulse 2  MEWS RR 0  MEWS LOC 0  MEWS Score 2  MEWS Score Color Yellow  Assess: if the MEWS score is Yellow or Red  Were vital signs taken at a resting state? Yes  Focused Assessment Documented focused assessment  Early Detection of Sepsis Score *See Row Information* Low  MEWS guidelines implemented *See Row Information* Yes  Treat  MEWS Interventions Other (Comment);Escalated (See documentation below) (pt on [place] Drip titration)  Take Vital Signs  Increase Vital Sign Frequency  Yellow: Q 2hr X 2 then Q 4hr X 2, if remains yellow, continue Q 4hrs  Escalate  MEWS: Escalate Yellow: discuss with charge nurse/RN and consider discussing with provider and RRT  Notify: Charge Nurse/RN  Name of Charge Nurse/RN Notified [place] RN  Date Charge Nurse/RN Notified 02/02/20  Time Charge Nurse/RN Notified 2200  Document  Patient Outcome  (Pt SB but Asymtomatic, PT hR sustaining at 3)  pt here for SB with complete heart block plan for pacemaker.Pt asymptomatic. Will  follow mews protocol.

## 2020-02-02 NOTE — H&P (Addendum)
Cardiology History and Physical:   Patient ID: Erika [name]; Badin:[id number]; 03-May-1941   Admit date: 02/02/2020 Date of Consult: 02/02/2020  Primary Care Provider: Hayden Rasmussen, MD Primary Cardiologist: Pixie Casino, MD 09/07/2018 Primary Electrophysiologist:  None   Patient Profile:   Erika [name] is a 79 y.o. female with a hx of HTN, HLD, GERD, IBS, OSA not on CPAP, sinus brady, Mobitz II but no high-grade heart block on Holter, LVD on echo 2019 w/ no sig CAD at cath & EF nl 2020, dementia dx 07/2019, who is being seen today for the evaluation of ?CHB at the request of Dr Laverta Baltimore.  Chief Complaint:   Symptomatic bradycardia  History of Present Illness:   [name] was evaluated in 2020 for bradycardia but no high-grade HB seen>>no BB/CCB to be used.   Neuro visit 04/29, noted changes in mental status, memory, ?sz (lamotrigine started) & EEG ordered, dementia (Aricept d/c'd due to side effects).   Pt brought to ER today for recurrent falls, bradycardic w/ HR 30s and ?CHB>>Cards asked to see.   [name] is oriented to name. She knows she is not at home, but not sure where she is. She is not having any pain, except her R elbow is a little sore. She has an abrasion there from a recent fall.   She remembers that she has fallen "all over the place" in the last few days. It is not clear that all these falls are from loss of consciousness.   07/2019, she had spells of staring into space and loss of vision. An echo, MRI, ordered by her PCP were ok.   In January, she had ?tonic/clonic activity episode. She also had continuous HA for a month or 2. Aricept d/c'd and HA resolved.   In the last few days, she has fallen, she got up unaided at least once, so not clear how many times she had fallen.   Today, she fell at least 3 times. She fell and lost consciousness, was incontinent. Felt nauseated and c/o HA. She was also having ?Sz activity, although she was waving her arms around as well as whole  body jerking.   She is currently awake and alert, at her baseline mental status. She is not aware of the low HR.    Past Medical History:  Diagnosis Date  . Absence of appendix, congenital   . Arthritis   . Complete heart block ([place]) 02/02/2020  . GERD (gastroesophageal reflux disease)   . Hiatal hernia   . History of colon polyps   . History of gastric polyp   . Hypercholesterolemia   . Hypertension    PER PT NO MEDICINE  . Irritable bowel syndrome    resolved last few years  . Mobitz type 2 second degree heart block    intermittant,  cardiac work-up done;  Cardiac cath 10-02-2018,  echo 09-24-2018, event monitor 09-24-2018 (all results in epic)  . Neck pain    right sided neck pain  . OSA (obstructive sleep apnea)    does not use c-pap did not like  . Osteoporosis 06/2017   T score -2.8  . PSVT (paroxysmal supraventricular tachycardia) ([place])    short run on event monitor 09-24-2018  . Wears glasses     Past Surgical History:  Procedure Laterality Date  . CATARACT EXTRACTION W/ INTRAOCULAR LENS  IMPLANT, BILATERAL    . COLONOSCOPY    . cortisone shots in back    . ESOPHAGOGASTRODUODENOSCOPY    .  ESOPHAGOGASTRODUODENOSCOPY N/A 08/14/2013   Procedure: ESOPHAGOGASTRODUODENOSCOPY (EGD);  Surgeon: Jeryl Columbia, MD;  Location: [facility];  Service: Endoscopy;  Laterality: N/A;  . ESOPHAGOGASTRODUODENOSCOPY (EGD) WITH PROPOFOL N/A 09/29/2015   Procedure: ESOPHAGOGASTRODUODENOSCOPY (EGD) WITH PROPOFOL;  Surgeon: Clarene Essex, MD;  Location: [facility];  Service: Endoscopy;  Laterality: N/A;  . FOOT SURGERY Right 06-30-2005   dr duda   osteotomy first and second toe's  . HIATAL HERNIA REPAIR N/A 10/29/2018   Procedure: LAPAROSCOPIC REPAIR OF HIATAL HERNIA WITH NISSEN FUNDOPLICATION, WITH MESH;  Surgeon: Excell Seltzer, MD;  Location: [facility];  Service: General;  Laterality: N/A;  . HOT HEMOSTASIS N/A 08/14/2013   Procedure: HOT HEMOSTASIS (ARGON PLASMA COAGULATION/BICAP);   Surgeon: Jeryl Columbia, MD;  Location: [facility];  Service: Endoscopy;  Laterality: N/A;  . HOT HEMOSTASIS N/A 09/29/2015   Procedure: HOT HEMOSTASIS (ARGON PLASMA COAGULATION/BICAP);  Surgeon: Clarene Essex, MD;  Location: [facility];  Service: Endoscopy;  Laterality: N/A;  . RIGHT/LEFT HEART CATH AND CORONARY ANGIOGRAPHY N/A 10/02/2018   Procedure: RIGHT/LEFT HEART CATH AND CORONARY ANGIOGRAPHY;  Surgeon: Troy Sine, MD;  Location: [facility];  Service: Cardiovascular;  Laterality: N/A;  . TUBAL LIGATION  yrs ago     Prior to Admission medications   Medication Sig Start Date End Date Taking? Authorizing Provider  alendronate (FOSAMAX) 70 MG tablet Take 1 tablet (70 mg total) by mouth every 7 (seven) days. Take with a full glass of water on an empty stomach. 10/23/17   Fontaine, Belinda Block, MD  aspirin 81 MG tablet Take 1 tablet (81 mg total) by mouth daily. 09/27/18   Barrett, Evelene Croon, PA-C  Calcium Carb-Cholecalciferol (CALCIUM 600+D) 600-800 MG-UNIT TABS Take 1 tablet by mouth every morning.    [provider]  carbonyl iron (FEOSOL) 45 MG TABS tablet Take 45 mg by mouth at bedtime.    [provider]  Cyanocobalamin (VITAMIN B 12 PO) Take 1,000 mcg by mouth every evening.     [provider]  furosemide (LASIX) 20 MG tablet Take 20 mg by mouth daily. 01/06/20   [provider]  lamoTRIgine (LAMICTAL) 100 MG tablet Take 1 tablet (100 mg total) by mouth 2 (two) times daily. 01/23/20   Marcial Pacas, MD  lamoTRIgine (LAMICTAL) 25 MG tablet 1 tab bid x one week 2 tab bid x 2nd week 3 tab bid x 3rd week 01/23/20   Marcial Pacas, MD  losartan (COZAAR) 25 MG tablet Take 1 tablet (25 mg total) by mouth daily. Patient not taking: Reported on 10/16/2018 09/27/18 12/26/18  Barrett, Evelene Croon, PA-C  memantine (NAMENDA) 5 MG tablet Take 1 tablet (5 mg total) by mouth 2 (two) times daily. 01/23/20   Marcial Pacas, MD  omeprazole (PRILOSEC) 40 MG capsule TAKE 1 CAPSULE BY MOUTH   DAILY WITH BREAKFAST 11/11/19   Irene Shipper, MD  Probiotic Product (HEALTHY COLON PO) Take 1 capsule by mouth daily.     [provider]    Inpatient Medications: Scheduled Meds:  Continuous Infusions:  PRN Meds:   Allergies:   No Known Allergies  Social History:   Social History   Socioeconomic History  . Marital status: Married    Spouse name: Eddie Dibbles  . Number of children: 1  . Years of education: 59  . Highest education level: Not on file  Occupational History    Employer: RETIRED    Comment: Retired  Tobacco Use  . Smoking status: Never Smoker  .  Smokeless tobacco: Never Used  Substance and Sexual Activity  . Alcohol use: No  . Drug use: No  . Sexual activity: Never    Comment: 1st intercourse 5 yo-1 partner  Other Topics Concern  . Not on file  Social History Narrative   . Advised patient of provider's approval for requested procedure, as well as any comments/instructions from provider.       Provided patient w/ verbal instructions concerning pre-, intra- and post-procedure preparation and instructions.      Patient verbalized understanding of the above.       Patient has also been mailed a letter containing the following instructions             Patient lives at home with her husband Eddie Dibbles. Patient has a high school education. Patient is retired.   Right handed.   Caffeine-None   Social Determinants of Health   Financial Resource Strain:   . Difficulty of Paying Living Expenses:   Food Insecurity:   . Worried About Charity fundraiser in the Last Year:   . Arboriculturist in the Last Year:   Transportation Needs:   . Film/video editor (Medical):   Marland Kitchen Lack of Transportation (Non-Medical):   Physical Activity:   . Days of Exercise per Week:   . Minutes of Exercise per Session:   Stress:   . Feeling of Stress :   Social Connections:   . Frequency of Communication with Friends and Family:   . Frequency of Social Gatherings with Friends  and Family:   . Attends Religious Services:   . Active Member of Clubs or Organizations:   . Attends Archivist Meetings:   Marland Kitchen Marital Status:   Intimate Partner Violence:   . Fear of Current or Ex-Partner:   . Emotionally Abused:   Marland Kitchen Physically Abused:   . Sexually Abused:     Family History:   Family History  Problem Relation Age of Onset  . Heart disease Father        MI  . Kidney failure Mother        pregnant with twins  . Heart disease Paternal Uncle        MI  . Heart disease Paternal Uncle        MI  . Heart disease Paternal Uncle        MI  . Heart disease Paternal Uncle        MI  . Colon cancer Neg Hx   . Liver cancer Neg Hx   . Esophageal cancer Neg Hx   . Rectal cancer Neg Hx   . Stomach cancer Neg Hx    Family Status:  Family Status  Relation Name Status  . Father  Deceased  . Mother  Deceased  . Annamarie Major  (Not Specified)  . Annamarie Major  (Not Specified)  . Annamarie Major  (Not Specified)  . Annamarie Major  (Not Specified)  . Neg Hx  (Not Specified)    ROS:  Please see the history of present illness.  All other ROS reviewed and negative.     Physical Exam/Data:   Vitals:   02/02/20 1314 02/02/20 1317 02/02/20 1355  BP:   (!) 210/78  Pulse:  (!) 30   Resp:  16   Temp:  98.6 F (37 C)   TempSrc:  Oral   SpO2: 96% 97%    No intake or output data in the 24 hours ending 02/02/20 1429  Last 3 Weights 01/23/2020 10/29/2018 10/23/2018  Weight (lbs) 129 lb 8 oz 120 lb 2 oz 120 lb 2 oz  Weight (kg) 58.741 kg 54.488 kg 54.488 kg     There is no height or weight on file to calculate BMI.   General:  Well nourished, well developed, female in no acute distress HEENT: normal Lymph: no adenopathy Neck: JVD - not elevated Endocrine:  No thryomegaly Vascular: + carotid bruits>>but are radiation of murmur; 4/4 extremity pulses 2+  Cardiac:  normal S1, S2; RRR but slow; 2/6 murmur Lungs:  clear bilaterally, no wheezing, rhonchi or rales  Abd: soft,  nontender, no hepatomegaly  Ext: no edema Musculoskeletal:  No deformities, BUE and BLE strength normal and equal Skin: warm and dry  Neuro:  CNs 2-12 intact, no focal abnormalities noted Psych:  Normal affect   EKG:  The EKG was personally reviewed and demonstrates:  CHB, HR 29 Telemetry:  Telemetry was personally reviewed and demonstrates:  CHB, HR 30s   CV studies:   ECHO: 11/13/2019 1. Left ventricular ejection fraction, by estimation, is 65 to 70%. The  left ventricle has normal function. The left ventricle has no regional  wall motion abnormalities. There is mild concentric left ventricular  hypertrophy. Left ventricular diastolic  parameters are consistent with Grade I diastolic dysfunction (impaired  relaxation). Elevated left atrial pressure. The average left ventricular  global longitudinal strain is -19.0 %.  2. Right ventricular systolic function is normal. The right ventricular  size is normal. There is normal pulmonary artery systolic pressure. The  estimated right ventricular systolic pressure is 99991111 mmHg.  3. Left atrial size was mildly dilated.  4. The mitral valve is normal in structure and function. Mild mitral  valve regurgitation. No evidence of mitral stenosis.  5. The aortic valve is normal in structure and function. Aortic valve  regurgitation is not visualized. Mild to moderate aortic valve  sclerosis/calcification is present, without any evidence of aortic  stenosis.  6. The inferior vena cava is normal in size with greater than 50%  respiratory variability, suggesting right atrial pressure of 3 mmHg.   ECHO: 09/24/2018 - Left ventricle: The cavity size was normal. Wall thickness was increased in a pattern of mild LVH. Systolic function was mildly to moderately reduced. The estimated ejection fraction was in the range of 40% to 45%. There is hypokinesis of the anteroseptal myocardium. Doppler parameters are consistent with abnormal  left ventricular relaxation (grade 1 diastolic dysfunction). Doppler parameters are consistent with high ventricular filling pressure. - Mitral valve: Calcified annulus. There was mild regurgitation. - Left atrium: The atrium was mildly dilated. - Pulmonary arteries: Systolic pressure was mildly increased. PA peak pressure: 35 mm Hg (S). - Pericardium, extracardiac: A moderate pericardial effusion was identified. There was right atrial chamber collapse.  Impressions:  - Hypokinesis of the septum with overall mild to moderate LV dysfunction; mild LVH; mild diastolic dysfunction with elevated LV filling pressure; mild MR; mild LAE; mild TR; mild pulmonary hypertension; moderate pericardial effusion; evidence of RA collapse and borderline RV collapse; howevere IVC collapses. Discussed with DOD (Dr Lovena Le) who will arrange FU for pt; previously seen by Dr Debara Pickett.   CATH: 10/02/2018  Ost LAD to Prox LAD lesion is 10% stenosed.  The left ventricular ejection fraction is 50-55% by visual estimate.  LV end diastolic pressure is mildly elevated.   Normal right heart pressures.  No significant coronary obstructive disease with minimal 10% narrowing in the proximal LAD.  Low normal LV function with an EF of 50 to 55%.    Laboratory Data:   ChemistryNo results for input(s): NA, K, CL, CO2, GLUCOSE, BUN, CREATININE, CALCIUM, GFRNONAA, GFRAA, ANIONGAP in the last 168 hours.  No results found for: ALT, AST, GGT, ALKPHOS, BILITOT Hematology Recent Labs  Lab 02/02/20 1312  WBC 10.7*  RBC 4.59  HGB 12.6  HCT 41.8  MCV 91.1  MCH 27.5  MCHC 30.1  RDW 13.1  PLT 234   Cardiac Enzymes High Sensitivity Troponin:  No results for input(s): TROPONINIHS in the last 720 hours.    BNPNo results for input(s): BNP, PROBNP in the last 168 hours.  DDimer No results for input(s): DDIMER in the last 168 hours. TSH:  Lab Results  Component Value Date   TSH 2.090  01/23/2020   Lipids:No results found for: CHOL, HDL, LDLCALC, LDLDIRECT, TRIG, CHOLHDL HgbA1c:No results found for: HGBA1C Magnesium: No results found for: MG   Radiology/Studies:  CT Head Wo Contrast  Result Date: 02/02/2020 CLINICAL DATA:  Seizure, nontraumatic. Additional history provided: Patient with history of dementia, 3 falls today, patient with reported seizure following the most recent fall, increased confusion over the last few months EXAM: CT HEAD WITHOUT CONTRAST TECHNIQUE: Contiguous axial images were obtained from the base of the skull through the vertex without intravenous contrast. COMPARISON:  Brain MRI 09/13/2019, head CT 09/09/2014 FINDINGS: Brain: Mild chronic small vessel ischemic changes were better appreciated on prior MRI 09/13/2019. Stable, mild generalized parenchymal atrophy. There is no acute intracranial hemorrhage. No demarcated cortical infarct. No extra-axial fluid collection. No evidence of intracranial mass. No midline shift. Vascular: No hyperdense vessel.  Atherosclerotic calcifications. Skull: Normal. Negative for fracture or focal lesion. Sinuses/Orbits: Visualized orbits show no acute finding. Mild ethmoid and maxillary sinus mucosal thickening. No significant mastoid effusion. IMPRESSION: 1. No evidence of acute intracranial abnormality. 2. Mild generalized parenchymal atrophy and chronic small vessel ischemic disease. 3. Mild ethmoid and maxillary sinus mucosal thickening. Electronically Signed   By: Kellie Simmering DO   On: 02/02/2020 14:25   DG Chest Portable 1 View  Result Date: 02/02/2020 CLINICAL DATA:  Cough with recent falls EXAM: PORTABLE CHEST 1 VIEW COMPARISON:  August 28, 2012 FINDINGS: There is slight left base atelectasis. Lungs are otherwise clear. Heart is mildly enlarged with pulmonary vascularity normal. No adenopathy. There is aortic atherosclerosis. Previously noted hiatal hernia is not appreciable on current examination. IMPRESSION: Slight left  base atelectasis. No edema or airspace opacity. Mild cardiomegaly. No adenopathy. Aortic Atherosclerosis (ICD10-I70.0). Electronically Signed   By: Lowella Grip III M.D.   On: 02/02/2020 14:09    Assessment and Plan:   1. CHB:  - not on rate-lowering medications - maintaining a BP, but has to be checked manually - LOC is good. - Dtr is present, family will need to give consent for any procedures.  - follow for Sz sx and other neuro sx once HR normalized. - TSH and other labs pending. - start DBA to keep HR up, follow BP  Principal Problem:   Complete heart block ([place])     For questions or updates, please contact [place] Please consult [email or web address] for contact info under Cardiology/STEMI.   Signed, Rosaria Ferries, PA-C  02/02/2020 2:29 PM   Patient seen and discussed with PA Barrett, I agree with her documenation above. 79 yo female with prior history of Mobitz II noted in 08/2018 cardiology note that was asymptomatic, dementia, prior LV dysfunction in 2019 that later  normalized, dementia, history of seizures, admitted with syncope and falls at home. In ER found to be in complete heart block, with elevated bp's.     WBC 10.7 Plt 234 Mg 1.9 K 4.4 Cr 1.12 Trop 22 EKG SR, comlete heart block, likely junction escape with RBBB pattern CXR no acute process  Jan 2020 cath: no significant CAD, normal filling pressures 10/2019 echo LVEF 65-70%, no WMAs, grade I DDx, normal RV   Patient presents with complete heart block. Had some evidence of mobitz II on 09/05/18 EKG, appears her conduction disease/heart block has progressed. She is not on any av nodal agents at home. She is actually HYPERtensive. Do not see indication for temp wire at this time, can try low dose dobutamine to see if responds. Will need EP evaluation tomrrow for pacemaker consideration, keep npo at midnight.    Carlyle Dolly MD

## 2020-02-02 NOTE — ED Triage Notes (Signed)
Per [facility], Pt from home with husband, has hx of dementia. Pt had 3 falls today. The last fall pt had a seizure after the fall. Pt recently diagnosed with seizures. Family reports increased confusion over the last few months.

## 2020-02-03 ENCOUNTER — Encounter (HOSPITAL_COMMUNITY)
Admission: EM | Disposition: A | Discharge status: Home / Self Care | Payer: Self-pay | Source: Home / Self Care | Attending: Cardiology

## 2020-02-03 DIAGNOSIS — F0391 Unspecified dementia with behavioral disturbance: Secondary | ICD-10-CM | POA: Diagnosis not present

## 2020-02-03 DIAGNOSIS — R413 Other amnesia: Secondary | ICD-10-CM | POA: Diagnosis not present

## 2020-02-03 DIAGNOSIS — R519 Headache, unspecified: Secondary | ICD-10-CM | POA: Diagnosis not present

## 2020-02-03 DIAGNOSIS — R4189 Other symptoms and signs involving cognitive functions and awareness: Secondary | ICD-10-CM | POA: Diagnosis not present

## 2020-02-03 DIAGNOSIS — R55 Syncope and collapse: Secondary | ICD-10-CM | POA: Diagnosis not present

## 2020-02-03 HISTORY — PX: PACEMAKER IMPLANT: EP1218

## 2020-02-03 LAB — URINE CULTURE: Culture: NO GROWTH

## 2020-02-03 LAB — LIPID PANEL
Cholesterol: 205 mg/dL — ABNORMAL HIGH (ref 0–200)
HDL: 63 mg/dL (ref >40)
LDL Cholesterol: 131 mg/dL — ABNORMAL HIGH (ref 0–99)
Total CHOL/HDL Ratio: 3.3 RATIO
Triglycerides: 55 mg/dL (ref <150)
VLDL: 11 mg/dL (ref 0–40)

## 2020-02-03 LAB — SURGICAL PCR SCREEN
MRSA, PCR: NEGATIVE
Staphylococcus aureus: NEGATIVE

## 2020-02-03 LAB — GLUCOSE, CAPILLARY: Glucose-Capillary: 105 mg/dL — ABNORMAL HIGH (ref 70–99)

## 2020-02-03 SURGERY — PACEMAKER IMPLANT
Anesthesia: LOCAL

## 2020-02-03 MED ORDER — ACETAMINOPHEN 325 MG PO TABS: 325.0000 mg | ORAL_TABLET | ORAL | Status: DC | PRN

## 2020-02-03 MED ORDER — IOHEXOL 350 MG/ML SOLN
INTRAVENOUS | Status: DC | PRN
  Administered 2020-02-03: 15:00:00 10 mL

## 2020-02-03 MED ORDER — CEFAZOLIN SODIUM-DEXTROSE 2-4 GM/100ML-% IV SOLN
INTRAVENOUS | Status: AC
  Filled 2020-02-03: qty 100

## 2020-02-03 MED ORDER — LIDOCAINE HCL (PF) 1 % IJ SOLN
INTRAMUSCULAR | Status: DC | PRN
  Administered 2020-02-03: 60 mL

## 2020-02-03 MED ORDER — ALPRAZOLAM 0.5 MG PO TABS
0.5000 mg | ORAL_TABLET | Freq: Once | ORAL | Status: AC
  Administered 2020-02-03: 0.5 mg via ORAL
  Filled 2020-02-03: qty 1

## 2020-02-03 MED ORDER — TRAZODONE HCL 50 MG PO TABS
50.0000 mg | ORAL_TABLET | Freq: Every evening | ORAL | Status: DC | PRN
  Administered 2020-02-03: 50 mg via ORAL
  Filled 2020-02-03: qty 1

## 2020-02-03 MED ORDER — LIDOCAINE HCL (PF) 1 % IJ SOLN
INTRAMUSCULAR | Status: AC
  Filled 2020-02-03: qty 60

## 2020-02-03 MED ORDER — SODIUM CHLORIDE 0.9 % IV SOLN
INTRAVENOUS | Status: AC
  Filled 2020-02-03: qty 2

## 2020-02-03 MED ORDER — ONDANSETRON HCL 4 MG/2ML IJ SOLN: 4.0000 mg | Freq: Four times a day (QID) | INTRAMUSCULAR | Status: DC | PRN

## 2020-02-03 MED ORDER — HEPARIN (PORCINE) IN NACL 1000-0.9 UT/500ML-% IV SOLN
INTRAVENOUS | Status: DC | PRN
  Administered 2020-02-03: 500 mL

## 2020-02-03 MED ORDER — CEFAZOLIN SODIUM-DEXTROSE 1-4 GM/50ML-% IV SOLN
1.0000 g | Freq: Four times a day (QID) | INTRAVENOUS | Status: AC
  Administered 2020-02-03 – 2020-02-04 (×3): 1 g via INTRAVENOUS
  Filled 2020-02-03 (×3): qty 50

## 2020-02-03 MED ORDER — HEPARIN (PORCINE) IN NACL 1000-0.9 UT/500ML-% IV SOLN
INTRAVENOUS | Status: AC
  Filled 2020-02-03: qty 500

## 2020-02-03 MED ORDER — OLANZAPINE 5 MG PO TBDP
2.5000 mg | ORAL_TABLET | Freq: Once | ORAL | Status: AC
  Administered 2020-02-04: 2.5 mg via ORAL
  Filled 2020-02-03: qty 0.5

## 2020-02-03 SURGICAL SUPPLY — 7 items
CABLE SURGICAL S-101-97-12 (CABLE) ×2 IMPLANT
LEAD TENDRIL MRI 46CM LPA1200M (Lead) ×1 IMPLANT
LEAD TENDRIL MRI 52CM LPA1200M (Lead) ×1 IMPLANT
PACEMAKER ASSURITY DR-RF (Pacemaker) ×1 IMPLANT
PAD PRO RADIOLUCENT 2001M-C (PAD) ×2 IMPLANT
SHEATH 8FR PRELUDE SNAP 13 (SHEATH) ×2 IMPLANT
TRAY PACEMAKER INSERTION (PACKS) ×2 IMPLANT

## 2020-02-03 NOTE — Progress Notes (Signed)
Pt is confused, restless and impulsive. Gave Medication to help relax. With [place] replaced. Correctly on bedrest

## 2020-02-03 NOTE — Progress Notes (Signed)
Pt is confused and restless , pt is removing arm from sling and moving it , picking at dressing and trying to get out of the bed.  MD notified ' We tried medication management no improvement Daughter notified and informed her we need her help for her mothers safety with risk of harming pacemaker  daughter states " you are a hospital surely you know how to handle dementia patients"  MD notifed a second time  Will continue to monitor

## 2020-02-03 NOTE — H&P (View-Only) (Signed)
ELECTROPHYSIOLOGY CONSULT NOTE    Patient ID: Erika [name] MRN: [id number], DOB/AGE: [birth year] 79 y.o.  Admit date: 02/02/2020 Date of Consult: 02/03/2020  Primary Physician: Hayden Rasmussen, MD Primary Cardiologist: Pixie Casino, MD  Electrophysiologist: New to Dr. Lovena Le   Referring Provider: Dr. Harl Bowie  Patient Profile: Erika [name] is a 79 y.o. female with a history of HTN, HLD, GERD, IBS, OSA not on CPAP, sinus brady and known Mobitz II with no previous high grade HB on holter, LVD on echo 2019, no significant CAD at cath, and EF normal 2020, and dementia who is being seen today for the evaluation of CHB at the request of Dr. Harl Bowie.  HPI:  Erika [name] is a 79 y.o. female with medical history above brought to ER 02/02/2020 with recurrent falls. Found to be bradycardia in 30s. Remembers having fallen. Unclear how many episodes as she has previously gotten herself up off the floor.   07/2001 had spells of staring into space and loss of vision. Echo and MRI were OK.   09/2019 had ?tonic/clinic activity. Also had continuous headache. Aricept discontinued and HA resolved.   Pt fell at least 3 times, but as above has gotten up unaided at least once, so unclear #. Some question of seizure like activity, though arms were waving as well.   Pt is aware and alert this am. Says she is "OK, but not OK". No specific complaint this am.   Family not yet present. Pt does not really remember talking to cardiology yesterday.   Past Medical History:  Diagnosis Date  . Absence of appendix, congenital   . Arthritis   . Complete heart block ([place]) 02/02/2020  . GERD (gastroesophageal reflux disease)   . Hiatal hernia   . History of colon polyps   . History of gastric polyp   . Hypercholesterolemia   . Hypertension    PER PT NO MEDICINE  . Irritable bowel syndrome    resolved last few years  . Mobitz type 2 second degree heart block    intermittant,  cardiac work-up done;  Cardiac cath  10-02-2018,  echo 09-24-2018, event monitor 09-24-2018 (all results in epic)  . Neck pain    right sided neck pain  . OSA (obstructive sleep apnea)    does not use c-pap did not like  . Osteoporosis 06/2017   T score -2.8  . PSVT (paroxysmal supraventricular tachycardia) ([place])    short run on event monitor 09-24-2018  . Wears glasses      Surgical History:  Past Surgical History:  Procedure Laterality Date  . CATARACT EXTRACTION W/ INTRAOCULAR LENS  IMPLANT, BILATERAL    . COLONOSCOPY    . cortisone shots in back    . ESOPHAGOGASTRODUODENOSCOPY    . ESOPHAGOGASTRODUODENOSCOPY N/A 08/14/2013   Procedure: ESOPHAGOGASTRODUODENOSCOPY (EGD);  Surgeon: Jeryl Columbia, MD;  Location: [facility];  Service: Endoscopy;  Laterality: N/A;  . ESOPHAGOGASTRODUODENOSCOPY (EGD) WITH PROPOFOL N/A 09/29/2015   Procedure: ESOPHAGOGASTRODUODENOSCOPY (EGD) WITH PROPOFOL;  Surgeon: Clarene Essex, MD;  Location: [facility];  Service: Endoscopy;  Laterality: N/A;  . FOOT SURGERY Right 06-30-2005   dr duda   osteotomy first and second toe's  . HIATAL HERNIA REPAIR N/A 10/29/2018   Procedure: LAPAROSCOPIC REPAIR OF HIATAL HERNIA WITH NISSEN FUNDOPLICATION, WITH MESH;  Surgeon: Excell Seltzer, MD;  Location: [facility];  Service: General;  Laterality: N/A;  . HOT HEMOSTASIS N/A 08/14/2013   Procedure: HOT HEMOSTASIS (ARGON PLASMA COAGULATION/BICAP);  Surgeon: Jeryl Columbia, MD;  Location: [facility];  Service: Endoscopy;  Laterality: N/A;  . HOT HEMOSTASIS N/A 09/29/2015   Procedure: HOT HEMOSTASIS (ARGON PLASMA COAGULATION/BICAP);  Surgeon: Clarene Essex, MD;  Location: [facility];  Service: Endoscopy;  Laterality: N/A;  . RIGHT/LEFT HEART CATH AND CORONARY ANGIOGRAPHY N/A 10/02/2018   Procedure: RIGHT/LEFT HEART CATH AND CORONARY ANGIOGRAPHY;  Surgeon: Troy Sine, MD;  Location: [facility];  Service: Cardiovascular;  Laterality: N/A;  . TUBAL LIGATION  yrs ago     Medications Prior to Admission    Medication Sig Dispense Refill Last Dose  . alendronate (FOSAMAX) 70 MG tablet Take 1 tablet (70 mg total) by mouth every 7 (seven) days. Take with a full glass of water on an empty stomach. 12 tablet 4 Past Week at Unknown time  . aspirin 81 MG tablet Take 1 tablet (81 mg total) by mouth daily. 90 tablet 1 Past Week at Unknown time  . Calcium Carb-Cholecalciferol (CALCIUM 600+D) 600-800 MG-UNIT TABS Take 1 tablet by mouth every morning.   Past Week at Unknown time  . Cyanocobalamin (VITAMIN B 12 PO) Take 1,000 mcg by mouth every evening.    Past Week at Unknown time  . furosemide (LASIX) 20 MG tablet Take 20 mg by mouth as needed for fluid.    Past Week at Unknown time  . lamoTRIgine (LAMICTAL) 25 MG tablet 1 tab bid x one week 2 tab bid x 2nd week 3 tab bid x 3rd week 84 tablet 0 Past Week at Unknown time  . losartan (COZAAR) 25 MG tablet Take 1 tablet (25 mg total) by mouth daily. 90 tablet 3 Past Week at Unknown time  . memantine (NAMENDA) 5 MG tablet Take 1 tablet (5 mg total) by mouth 2 (two) times daily. 60 tablet 11 Past Week at Unknown time  . omeprazole (PRILOSEC) 40 MG capsule TAKE 1 CAPSULE BY MOUTH  DAILY WITH BREAKFAST (Patient taking differently: Take 40 mg by mouth daily. ) 90 capsule 1 Past Month at Unknown time  . Probiotic Product (HEALTHY COLON PO) Take 1 capsule by mouth daily.    Past Week at Unknown time  . lamoTRIgine (LAMICTAL) 100 MG tablet Take 1 tablet (100 mg total) by mouth 2 (two) times daily. 60 tablet 11 not yet    Inpatient Medications:  . aspirin EC  81 mg Oral Daily  . calcium-vitamin D  1 tablet Oral q morning - 10a  . enoxaparin (LOVENOX) injection  40 mg Subcutaneous Q24H  . gentamicin irrigation  80 mg Irrigation On Call  . lamoTRIgine  50 mg Oral BID  . losartan  25 mg Oral Daily  . memantine  5 mg Oral BID  . pantoprazole  40 mg Oral Daily  . sodium chloride flush  3 mL Intravenous Q12H  . sodium chloride flush  3 mL Intravenous Q12H  . vitamin  B-12  1,000 mcg Oral QPM    Allergies: No Known Allergies  Social History   Socioeconomic History  . Marital status: Married    Spouse name: Eddie Dibbles  . Number of children: 1  . Years of education: 54  . Highest education level: Not on file  Occupational History    Employer: RETIRED    Comment: Retired  Tobacco Use  . Smoking status: Never Smoker  . Smokeless tobacco: Never Used  Substance and Sexual Activity  . Alcohol use: No  . Drug use: No  . Sexual activity: Never  Comment: 1st intercourse 89 yo-1 partner  Other Topics Concern  . Not on file  Social History Narrative   . Advised patient of provider's approval for requested procedure, as well as any comments/instructions from provider.       Provided patient w/ verbal instructions concerning pre-, intra- and post-procedure preparation and instructions.      Patient verbalized understanding of the above.       Patient has also been mailed a letter containing the following instructions :       has       Patient lives at home with her husband Eddie Dibbles. Patient has a high school education. Patient is retired.   Right handed.   Caffeine-None   Social Determinants of Health   Financial Resource Strain:   . Difficulty of Paying Living Expenses:   Food Insecurity:   . Worried About Charity fundraiser in the Last Year:   . Arboriculturist in the Last Year:   Transportation Needs:   . Film/video editor (Medical):   Marland Kitchen Lack of Transportation (Non-Medical):   Physical Activity:   . Days of Exercise per Week:   . Minutes of Exercise per Session:   Stress:   . Feeling of Stress :   Social Connections:   . Frequency of Communication with Friends and Family:   . Frequency of Social Gatherings with Friends and Family:   . Attends Religious Services:   . Active Member of Clubs or Organizations:   . Attends Archivist Meetings:   Marland Kitchen Marital Status:   Intimate Partner Violence:   . Fear of Current or  Ex-Partner:   . Emotionally Abused:   Marland Kitchen Physically Abused:   . Sexually Abused:      Family History  Problem Relation Age of Onset  . Heart disease Father        MI  . Kidney failure Mother        pregnant with twins  . Heart disease Paternal Uncle        MI  . Heart disease Paternal Uncle        MI  . Heart disease Paternal Uncle        MI  . Heart disease Paternal Uncle        MI  . Colon cancer Neg Hx   . Liver cancer Neg Hx   . Esophageal cancer Neg Hx   . Rectal cancer Neg Hx   . Stomach cancer Neg Hx      Review of Systems: All other systems reviewed and are otherwise negative except as noted above.  Physical Exam: Vitals:   02/03/20 0000 02/03/20 0100 02/03/20 0200 02/03/20 0400  BP: (!) 130/25  (!) 131/34 (!) 147/51  Pulse: (!) 37 (!) 40 (!) 37 (!) 43  Resp: 20 17 19 17   Temp: 98.6 F (37 C)  98.8 F (37.1 C) 98.5 F (36.9 C)  TempSrc: Oral  Oral Oral  SpO2: 95% 93% 91% 95%  Weight:    59.4 kg  Height:        GEN- The patient is well appearing, alert and oriented x 3 today.   HEENT: normocephalic, atraumatic; sclera clear, conjunctiva pink; hearing intact; oropharynx clear; neck supple Lungs- Clear to ausculation bilaterally, normal work of breathing.  No wheezes, rales, rhonchi Heart- Regular rate and rhythm, no murmurs, rubs or gallops GI- soft, non-tender, non-distended, bowel sounds present Extremities- no clubbing, cyanosis, or edema; DP/PT/radial pulses 2+ bilaterally  MS- no significant deformity or atrophy Skin- warm and dry, no rash or lesion Psych- euthymic mood, full affect Neuro- strength and sensation are intact  Labs:   Lab Results  Component Value Date   WBC 10.7 (H) 02/02/2020   HGB 12.6 02/02/2020   HCT 41.8 02/02/2020   MCV 91.1 02/02/2020   PLT 234 02/02/2020    Recent Labs  Lab 02/02/20 1312  NA 141  K 4.4  CL 103  CO2 24  BUN 25*  CREATININE 1.12*  CALCIUM 9.7  PROT 7.2  BILITOT 0.8  ALKPHOS 51  ALT 12  AST  22  GLUCOSE 113*      Radiology/Studies: CT Head Wo Contrast  Result Date: 02/02/2020 CLINICAL DATA:  Seizure, nontraumatic. Additional history provided: Patient with history of dementia, 3 falls today, patient with reported seizure following the most recent fall, increased confusion over the last few months EXAM: CT HEAD WITHOUT CONTRAST TECHNIQUE: Contiguous axial images were obtained from the base of the skull through the vertex without intravenous contrast. COMPARISON:  Brain MRI 09/13/2019, head CT 09/09/2014 FINDINGS: Brain: Mild chronic small vessel ischemic changes were better appreciated on prior MRI 09/13/2019. Stable, mild generalized parenchymal atrophy. There is no acute intracranial hemorrhage. No demarcated cortical infarct. No extra-axial fluid collection. No evidence of intracranial mass. No midline shift. Vascular: No hyperdense vessel.  Atherosclerotic calcifications. Skull: Normal. Negative for fracture or focal lesion. Sinuses/Orbits: Visualized orbits show no acute finding. Mild ethmoid and maxillary sinus mucosal thickening. No significant mastoid effusion. IMPRESSION: 1. No evidence of acute intracranial abnormality. 2. Mild generalized parenchymal atrophy and chronic small vessel ischemic disease. 3. Mild ethmoid and maxillary sinus mucosal thickening. Electronically Signed   By: Kellie Simmering DO   On: 02/02/2020 14:25   DG Chest Portable 1 View  Result Date: 02/02/2020 CLINICAL DATA:  Cough with recent falls EXAM: PORTABLE CHEST 1 VIEW COMPARISON:  August 28, 2012 FINDINGS: There is slight left base atelectasis. Lungs are otherwise clear. Heart is mildly enlarged with pulmonary vascularity normal. No adenopathy. There is aortic atherosclerosis. Previously noted hiatal hernia is not appreciable on current examination. IMPRESSION: Slight left base atelectasis. No edema or airspace opacity. Mild cardiomegaly. No adenopathy. Aortic Atherosclerosis (ICD10-I70.0). Electronically Signed    By: Lowella Grip III M.D.   On: 02/02/2020 14:09    EKG:02/02/2020 showed complete heart block with junctional bradycardia at 28 bpm (personally reviewed)  TELEMETRY: CHB with rates in 30s (personally reviewed)  Assessment/Plan: 1.  Advanced AV block including CHB Conduction disease at baseline with Mobitz II on 09/05/2018. Now with CHB She has clear breakfast ordered and is NPO for procedure today.  Bilateral IVs in place Discussed risks and benefits with patient. In setting of dementia, will discuss further with family once available.  Pt currently on Dobutamine 20 mcg/kg/min with no improvement in HR. She was NOT on AV nodal blocking agents.   2. Dementia Family will need to sign consent.  Have called daughter and left message on machine to discuss.   ADDENDUM Discussed risks, benefits, procedure, and goals with daughter and husband at length. They verbalized understanding and agree to proceed with procedure.  Pt is relatively independent with her ADLs, memory is main issue. Husband is able to help, and daughter lives "right down the road".  For questions or updates, please contact [place] Please consult [email or web address] for contact info under Cardiology/STEMI.  Jacalyn Lefevre, PA-C  02/03/2020 6:51 AM  EP Attending  Patient seen  and examined. Agree with the findings as noted above. The patient presents with CHB and a very slow VR. She previously had LBBB and now has a RBBB escape around 30/min. She has been treated with IV dobutamine with some increase in her rates. She will undergo PPM insertion. The risks/benefits/goals/expectations of PPM insertion were reviewed and she wishes to proceed.   Mikle Bosworth.D.

## 2020-02-03 NOTE — Progress Notes (Signed)
Left HIPPA compliant message on daughters cell to call back 3e to obtain consent  Lollie Marrow, PA-C  02/03/2020 7:11 AM

## 2020-02-03 NOTE — Plan of Care (Signed)
  Problem: Education: Goal: Knowledge of General Education information will improve Description: Including pain rating scale, medication(s)/side effects and non-pharmacologic comfort measures Outcome: Progressing   Problem: Health Behavior/Discharge Planning: Goal: Ability to manage health-related needs will improve Outcome: Progressing   Problem: Clinical Measurements: Goal: Ability to maintain clinical measurements within normal limits will improve Outcome: Progressing   Problem: Clinical Measurements: Goal: Cardiovascular complication will be avoided Outcome: Progressing   Problem: Coping: Goal: Level of anxiety will decrease Outcome: Progressing   Problem: Safety: Goal: Ability to remain free from injury will improve Outcome: Progressing

## 2020-02-03 NOTE — Plan of Care (Signed)
New Pacer MAker

## 2020-02-03 NOTE — Consult Note (Addendum)
ELECTROPHYSIOLOGY CONSULT NOTE    Patient ID: Erika [name] MRN: [id number], DOB/AGE: 1941/04/18 79 y.o.  Admit date: 02/02/2020 Date of Consult: 02/03/2020  Primary Physician: Hayden Rasmussen, MD Primary Cardiologist: Pixie Casino, MD  Electrophysiologist: New to Dr. Lovena Le   Referring Provider: Dr. Harl Bowie  Patient Profile: Erika [name] is a 79 y.o. female with a history of HTN, HLD, GERD, IBS, OSA not on CPAP, sinus brady and known Mobitz II with no previous high grade HB on holter, LVD on echo 2019, no significant CAD at cath, and EF normal 2020, and dementia who is being seen today for the evaluation of CHB at the request of Dr. Harl Bowie.  HPI:  Erika [name] is a 79 y.o. female with medical history above brought to ER 02/02/2020 with recurrent falls. Found to be bradycardia in 30s. Remembers having fallen. Unclear how many episodes as she has previously gotten herself up off the floor.   07/2001 had spells of staring into space and loss of vision. Echo and MRI were OK.   09/2019 had ?tonic/clinic activity. Also had continuous headache. Aricept discontinued and HA resolved.   Pt fell at least 3 times, but as above has gotten up unaided at least once, so unclear #. Some question of seizure like activity, though arms were waving as well.   Pt is aware and alert this am. Says she is "OK, but not OK". No specific complaint this am.   Family not yet present. Pt does not really remember talking to cardiology yesterday.   Past Medical History:  Diagnosis Date  . Absence of appendix, congenital   . Arthritis   . Complete heart block ([place]) 02/02/2020  . GERD (gastroesophageal reflux disease)   . Hiatal hernia   . History of colon polyps   . History of gastric polyp   . Hypercholesterolemia   . Hypertension    PER PT NO MEDICINE  . Irritable bowel syndrome    resolved last few years  . Mobitz type 2 second degree heart block    intermittant,  cardiac work-up done;  Cardiac cath  10-02-2018,  echo 09-24-2018, event monitor 09-24-2018 (all results in epic)  . Neck pain    right sided neck pain  . OSA (obstructive sleep apnea)    does not use c-pap did not like  . Osteoporosis 06/2017   T score -2.8  . PSVT (paroxysmal supraventricular tachycardia) ([place])    short run on event monitor 09-24-2018  . Wears glasses      Surgical History:  Past Surgical History:  Procedure Laterality Date  . CATARACT EXTRACTION W/ INTRAOCULAR LENS  IMPLANT, BILATERAL    . COLONOSCOPY    . cortisone shots in back    . ESOPHAGOGASTRODUODENOSCOPY    . ESOPHAGOGASTRODUODENOSCOPY N/A 08/14/2013   Procedure: ESOPHAGOGASTRODUODENOSCOPY (EGD);  Surgeon: Jeryl Columbia, MD;  Location: [facility];  Service: Endoscopy;  Laterality: N/A;  . ESOPHAGOGASTRODUODENOSCOPY (EGD) WITH PROPOFOL N/A 09/29/2015   Procedure: ESOPHAGOGASTRODUODENOSCOPY (EGD) WITH PROPOFOL;  Surgeon: Clarene Essex, MD;  Location: [facility];  Service: Endoscopy;  Laterality: N/A;  . FOOT SURGERY Right 06-30-2005   dr duda   osteotomy first and second toe's  . HIATAL HERNIA REPAIR N/A 10/29/2018   Procedure: LAPAROSCOPIC REPAIR OF HIATAL HERNIA WITH NISSEN FUNDOPLICATION, WITH MESH;  Surgeon: Excell Seltzer, MD;  Location: [facility];  Service: General;  Laterality: N/A;  . HOT HEMOSTASIS N/A 08/14/2013   Procedure: HOT HEMOSTASIS (ARGON PLASMA COAGULATION/BICAP);  Surgeon: Jeryl Columbia, MD;  Location: [facility];  Service: Endoscopy;  Laterality: N/A;  . HOT HEMOSTASIS N/A 09/29/2015   Procedure: HOT HEMOSTASIS (ARGON PLASMA COAGULATION/BICAP);  Surgeon: Clarene Essex, MD;  Location: [facility];  Service: Endoscopy;  Laterality: N/A;  . RIGHT/LEFT HEART CATH AND CORONARY ANGIOGRAPHY N/A 10/02/2018   Procedure: RIGHT/LEFT HEART CATH AND CORONARY ANGIOGRAPHY;  Surgeon: Troy Sine, MD;  Location: [facility];  Service: Cardiovascular;  Laterality: N/A;  . TUBAL LIGATION  yrs ago     Medications Prior to Admission    Medication Sig Dispense Refill Last Dose  . alendronate (FOSAMAX) 70 MG tablet Take 1 tablet (70 mg total) by mouth every 7 (seven) days. Take with a full glass of water on an empty stomach. 12 tablet 4 Past Week at Unknown time  . aspirin 81 MG tablet Take 1 tablet (81 mg total) by mouth daily. 90 tablet 1 Past Week at Unknown time  . Calcium Carb-Cholecalciferol (CALCIUM 600+D) 600-800 MG-UNIT TABS Take 1 tablet by mouth every morning.   Past Week at Unknown time  . Cyanocobalamin (VITAMIN B 12 PO) Take 1,000 mcg by mouth every evening.    Past Week at Unknown time  . furosemide (LASIX) 20 MG tablet Take 20 mg by mouth as needed for fluid.    Past Week at Unknown time  . lamoTRIgine (LAMICTAL) 25 MG tablet 1 tab bid x one week 2 tab bid x 2nd week 3 tab bid x 3rd week 84 tablet 0 Past Week at Unknown time  . losartan (COZAAR) 25 MG tablet Take 1 tablet (25 mg total) by mouth daily. 90 tablet 3 Past Week at Unknown time  . memantine (NAMENDA) 5 MG tablet Take 1 tablet (5 mg total) by mouth 2 (two) times daily. 60 tablet 11 Past Week at Unknown time  . omeprazole (PRILOSEC) 40 MG capsule TAKE 1 CAPSULE BY MOUTH  DAILY WITH BREAKFAST (Patient taking differently: Take 40 mg by mouth daily. ) 90 capsule 1 Past Month at Unknown time  . Probiotic Product (HEALTHY COLON PO) Take 1 capsule by mouth daily.    Past Week at Unknown time  . lamoTRIgine (LAMICTAL) 100 MG tablet Take 1 tablet (100 mg total) by mouth 2 (two) times daily. 60 tablet 11 not yet    Inpatient Medications:  . aspirin EC  81 mg Oral Daily  . calcium-vitamin D  1 tablet Oral q morning - 10a  . enoxaparin (LOVENOX) injection  40 mg Subcutaneous Q24H  . gentamicin irrigation  80 mg Irrigation On Call  . lamoTRIgine  50 mg Oral BID  . losartan  25 mg Oral Daily  . memantine  5 mg Oral BID  . pantoprazole  40 mg Oral Daily  . sodium chloride flush  3 mL Intravenous Q12H  . sodium chloride flush  3 mL Intravenous Q12H  . vitamin  B-12  1,000 mcg Oral QPM    Allergies: No Known Allergies  Social History   Socioeconomic History  . Marital status: Married    Spouse name: Eddie Dibbles  . Number of children: 1  . Years of education: 60  . Highest education level: Not on file  Occupational History    Employer: RETIRED    Comment: Retired  Tobacco Use  . Smoking status: Never Smoker  . Smokeless tobacco: Never Used  Substance and Sexual Activity  . Alcohol use: No  . Drug use: No  . Sexual activity: Never  Comment: 1st intercourse 50 yo-1 partner  Other Topics Concern  . Not on file  Social History Narrative   . Advised patient of provider's approval for requested procedure, as well as any comments/instructions from provider.       Provided patient w/ verbal instructions concerning pre-, intra- and post-procedure preparation and instructions.      Patient verbalized understanding of the above.       Patient has also been mailed a letter containing the following instructions :       has       Patient lives at home with her husband Eddie Dibbles. Patient has a high school education. Patient is retired.   Right handed.   Caffeine-None   Social Determinants of Health   Financial Resource Strain:   . Difficulty of Paying Living Expenses:   Food Insecurity:   . Worried About Charity fundraiser in the Last Year:   . Arboriculturist in the Last Year:   Transportation Needs:   . Film/video editor (Medical):   Marland Kitchen Lack of Transportation (Non-Medical):   Physical Activity:   . Days of Exercise per Week:   . Minutes of Exercise per Session:   Stress:   . Feeling of Stress :   Social Connections:   . Frequency of Communication with Friends and Family:   . Frequency of Social Gatherings with Friends and Family:   . Attends Religious Services:   . Active Member of Clubs or Organizations:   . Attends Archivist Meetings:   Marland Kitchen Marital Status:   Intimate Partner Violence:   . Fear of Current or  Ex-Partner:   . Emotionally Abused:   Marland Kitchen Physically Abused:   . Sexually Abused:      Family History  Problem Relation Age of Onset  . Heart disease Father        MI  . Kidney failure Mother        pregnant with twins  . Heart disease Paternal Uncle        MI  . Heart disease Paternal Uncle        MI  . Heart disease Paternal Uncle        MI  . Heart disease Paternal Uncle        MI  . Colon cancer Neg Hx   . Liver cancer Neg Hx   . Esophageal cancer Neg Hx   . Rectal cancer Neg Hx   . Stomach cancer Neg Hx      Review of Systems: All other systems reviewed and are otherwise negative except as noted above.  Physical Exam: Vitals:   02/03/20 0000 02/03/20 0100 02/03/20 0200 02/03/20 0400  BP: (!) 130/25  (!) 131/34 (!) 147/51  Pulse: (!) 37 (!) 40 (!) 37 (!) 43  Resp: 20 17 19 17   Temp: 98.6 F (37 C)  98.8 F (37.1 C) 98.5 F (36.9 C)  TempSrc: Oral  Oral Oral  SpO2: 95% 93% 91% 95%  Weight:    59.4 kg  Height:        GEN- The patient is well appearing, alert and oriented x 3 today.   HEENT: normocephalic, atraumatic; sclera clear, conjunctiva pink; hearing intact; oropharynx clear; neck supple Lungs- Clear to ausculation bilaterally, normal work of breathing.  No wheezes, rales, rhonchi Heart- Regular rate and rhythm, no murmurs, rubs or gallops GI- soft, non-tender, non-distended, bowel sounds present Extremities- no clubbing, cyanosis, or edema; DP/PT/radial pulses 2+ bilaterally  MS- no significant deformity or atrophy Skin- warm and dry, no rash or lesion Psych- euthymic mood, full affect Neuro- strength and sensation are intact  Labs:   Lab Results  Component Value Date   WBC 10.7 (H) 02/02/2020   HGB 12.6 02/02/2020   HCT 41.8 02/02/2020   MCV 91.1 02/02/2020   PLT 234 02/02/2020    Recent Labs  Lab 02/02/20 1312  NA 141  K 4.4  CL 103  CO2 24  BUN 25*  CREATININE 1.12*  CALCIUM 9.7  PROT 7.2  BILITOT 0.8  ALKPHOS 51  ALT 12  AST  22  GLUCOSE 113*      Radiology/Studies: CT Head Wo Contrast  Result Date: 02/02/2020 CLINICAL DATA:  Seizure, nontraumatic. Additional history provided: Patient with history of dementia, 3 falls today, patient with reported seizure following the most recent fall, increased confusion over the last few months EXAM: CT HEAD WITHOUT CONTRAST TECHNIQUE: Contiguous axial images were obtained from the base of the skull through the vertex without intravenous contrast. COMPARISON:  Brain MRI 09/13/2019, head CT 09/09/2014 FINDINGS: Brain: Mild chronic small vessel ischemic changes were better appreciated on prior MRI 09/13/2019. Stable, mild generalized parenchymal atrophy. There is no acute intracranial hemorrhage. No demarcated cortical infarct. No extra-axial fluid collection. No evidence of intracranial mass. No midline shift. Vascular: No hyperdense vessel.  Atherosclerotic calcifications. Skull: Normal. Negative for fracture or focal lesion. Sinuses/Orbits: Visualized orbits show no acute finding. Mild ethmoid and maxillary sinus mucosal thickening. No significant mastoid effusion. IMPRESSION: 1. No evidence of acute intracranial abnormality. 2. Mild generalized parenchymal atrophy and chronic small vessel ischemic disease. 3. Mild ethmoid and maxillary sinus mucosal thickening. Electronically Signed   By: Kellie Simmering DO   On: 02/02/2020 14:25   DG Chest Portable 1 View  Result Date: 02/02/2020 CLINICAL DATA:  Cough with recent falls EXAM: PORTABLE CHEST 1 VIEW COMPARISON:  August 28, 2012 FINDINGS: There is slight left base atelectasis. Lungs are otherwise clear. Heart is mildly enlarged with pulmonary vascularity normal. No adenopathy. There is aortic atherosclerosis. Previously noted hiatal hernia is not appreciable on current examination. IMPRESSION: Slight left base atelectasis. No edema or airspace opacity. Mild cardiomegaly. No adenopathy. Aortic Atherosclerosis (ICD10-I70.0). Electronically Signed    By: Lowella Grip III M.D.   On: 02/02/2020 14:09    EKG:02/02/2020 showed complete heart block with junctional bradycardia at 28 bpm (personally reviewed)  TELEMETRY: CHB with rates in 30s (personally reviewed)  Assessment/Plan: 1.  Advanced AV block including CHB Conduction disease at baseline with Mobitz II on 09/05/2018. Now with CHB She has clear breakfast ordered and is NPO for procedure today.  Bilateral IVs in place Discussed risks and benefits with patient. In setting of dementia, will discuss further with family once available.  Pt currently on Dobutamine 20 mcg/kg/min with no improvement in HR. She was NOT on AV nodal blocking agents.   2. Dementia Family will need to sign consent.  Have called daughter and left message on machine to discuss.   ADDENDUM Discussed risks, benefits, procedure, and goals with daughter and husband at length. They verbalized understanding and agree to proceed with procedure.  Pt is relatively independent with her ADLs, memory is main issue. Husband is able to help, and daughter lives "right down the road".  For questions or updates, please contact [place] Please consult [email or web address] for contact info under Cardiology/STEMI.  Jacalyn Lefevre, PA-C  02/03/2020 6:51 AM  EP Attending  Patient seen  and examined. Agree with the findings as noted above. The patient presents with CHB and a very slow VR. She previously had LBBB and now has a RBBB escape around 30/min. She has been treated with IV dobutamine with some increase in her rates. She will undergo PPM insertion. The risks/benefits/goals/expectations of PPM insertion were reviewed and she wishes to proceed.   Mikle Bosworth.D.

## 2020-02-03 NOTE — Interval H&P Note (Signed)
History and Physical Interval Note:  02/03/2020 2:50 PM  Erika [name]  has presented today for surgery, with the diagnosis of CHB.  The various methods of treatment have been discussed with the patient and family. After consideration of risks, benefits and other options for treatment, the patient has consented to  Procedure(s): PACEMAKER IMPLANT (N/A) as a surgical intervention.  The patient's history has been reviewed, patient examined, no change in status, stable for surgery.  I have reviewed the patient's chart and labs.  Questions were answered to the patient's satisfaction.     Erika [name]

## 2020-02-04 ENCOUNTER — Inpatient Hospital Stay (HOSPITAL_COMMUNITY): Payer: Medicare Other

## 2020-02-04 MED ORDER — OLANZAPINE 5 MG PO TBDP
2.5000 mg | ORAL_TABLET | Freq: Once | ORAL | Status: DC
  Filled 2020-02-04: qty 0.5

## 2020-02-04 MED ORDER — ALPRAZOLAM 0.5 MG PO TABS
0.5000 mg | ORAL_TABLET | Freq: Once | ORAL | Status: AC
  Administered 2020-02-04: 0.5 mg via ORAL
  Filled 2020-02-04: qty 1

## 2020-02-04 MED ORDER — ACETAMINOPHEN 325 MG PO TABS: 650.0000 mg | ORAL_TABLET | ORAL | Status: AC | PRN

## 2020-02-04 MED ORDER — MELATONIN 5 MG PO TABS: 5.0000 mg | ORAL_TABLET | Freq: Every day | ORAL | Status: DC

## 2020-02-04 MED ORDER — LOSARTAN POTASSIUM 50 MG PO TABS: 50.0000 mg | ORAL_TABLET | Freq: Every day | ORAL | Status: DC

## 2020-02-04 MED ORDER — LOSARTAN POTASSIUM 25 MG PO TABS: 25.0000 mg | ORAL_TABLET | Freq: Once | ORAL | Status: DC

## 2020-02-04 MED ORDER — MELATONIN 5 MG PO TABS: 5.0000 mg | ORAL_TABLET | Freq: Every day | ORAL | 0 refills | Status: DC

## 2020-02-04 NOTE — Consult Note (Signed)
   [facility] Inpatient Consult   02/04/2020  Erika [name] 1941/04/18 [id number]   [place] referral from inpatient Transition of Care RNCM regarding patient's eligibility for [facility] Care Management program.  Currently, review of medical record reveals patient is listed with specialist in the network however, her primary care provider, Horald Pollen, MD of [place] is not showing as an in network provider on [facility] roster.[facility] Care Management partners with the patient's in network primary care providers, currently.  For questions, please contact:  Natividad Brood, RN BSN [facility] Liaison  [phone] business mobile phone Toll free office [phone]  Fax number: [phone] [name] '

## 2020-02-04 NOTE — Progress Notes (Signed)
  Paged for continue confusion and agitation leading to safety concerns for discharge.   Pt received Xanax (x2) and dose of Zypreza overnight, which could be major contributors.    Suspect combination of dementia exacerbated by unfamiliar surroundings and circumstances, and use of prn drugs as above. Suspect some if not all of her confusion will improve once back at home.   Given safety concern, will have IM weigh in for any additional recommendations.   Legrand Como [address]" [place], PA-C  02/04/2020 2:07 PM

## 2020-02-04 NOTE — Progress Notes (Addendum)
  IM discussed with patient and agree that most likely cause is exacerbation of her Dementia.    They and family are both OK with discharge, as on their examination of her the agitation has begun to gradually improve.    Will discharge as originally planned.   Legrand Como [address]" [place], PA-C  02/04/2020 2:41 PM   EP Attending  Agree with above. She has evidence of demential with behavioral disturbance as she had agitation.   Mikle Bosworth.D.

## 2020-02-04 NOTE — Discharge Instructions (Signed)
After Your Pacemaker   . You have a [employer]. Jude Pacemaker  ACTIVITY . Do not lift your arm above shoulder height for 1 week after your procedure. After 7 days, you [place] progress as below.     Tuesday [place] 18, 2021  Wednesday Res[place] 19, 2021 Thursday [place]ne 20, 2021 Friday [place] 21, 2021   . Do not lift, push, pull, or carry anything over 10 pounds with the affected arm until 6 weeks (Tuesday March 17, 2020 ) after your procedure.   . Do NOT DRIVE until you have been seen for your wound check, or as long as instructed by your healthcare provider.   . Ask your healthcare provider when you can go back to work   INCISION/Dressing . If you are on a blood thinner such as Coumadin, Xarelto, Eliquis, Plavix, or Pradaxa please confirm with your provider when this should be resumed.   . Monitor your Pacemaker site for redness, swelling, and drainage. Call the device clinic at [phone] if you experience these symptoms or fever/chills.  . If your incision is sealed with Steri-strips or staples, you [place] shower 7-10 days after your procedure or when told by your provider. Do not remove the steri-strips or let the shower hit directly on your site. You Allan wash around your site with soap and water.    Marland Kitchen Avoid lotions, ointments, or perfumes over your incision until it is well-healed.  . You [place] use a hot tub or a pool AFTER your wound check appointment if the incision is completely closed.  Marland Kitchen PAcemaker Alerts:  Some alerts are vibratory and others beep. These are NOT emergencies. Please call our office to let us know. If this occurs at night or on weekends, it can wait until the next business day. Send a remote transmission.  . If your device is capable of reading fluid status (for heart failure), you will be offered monthly monitoring to review this with you.   DEVICE MANAGEMENT . Remote monitoring is used to monitor your pacemaker from home. This monitoring is scheduled every 91 days by our office. It  allows [place] to keep an eye on the functioning of your device to ensure it is working properly. You will routinely see your Electrophysiologist annually (more often if necessary).   . You should receive your ID card for your new device in 4-8 weeks. Keep this card with you at all times once received. Consider wearing a medical alert bracelet or necklace.  . Your Pacemaker [place]is be MRI compatible. This will be discussed at your next office visit/wound check.  You should avoid contact with strong electric or magnetic fields.    Do not use amateur (ham) radio equipment or electric (arc) welding torches. MP3 player headphones with magnets should not be used. Some devices are safe to use if held at least 12 inches (30 cm) from your Pacemaker. These include power tools, lawn mowers, and speakers. If you are unsure if something is safe to use, ask your health care provider.   When using your cell phone, hold it to the ear that is on the opposite side from the Pacemaker. Do not leave your cell phone in a pocket over the Pacemaker.   You [place] safely use electric blankets, heating pads, computers, and microwave ovens.  Call the office right away if:  You have chest pain.  You feel more short of breath than you have felt before.  You feel more light-headed than you have felt before.  Your incision starts to open up.  This information is not intended to replace advice given to you by your health care provider. Make sure you discuss any questions you have with your health care provider.   

## 2020-02-04 NOTE — Discharge Summary (Addendum)
ELECTROPHYSIOLOGY PROCEDURE DISCHARGE SUMMARY    Patient ID: Erika [name],  MRN: [id number], DOB/AGE: Aug 27, 1941 79 y.o.  Admit date: 02/02/2020 Discharge date: 02/04/2020  Primary Care Physician: Hayden Rasmussen, MD  Primary Cardiologist: Pixie Casino, MD  Electrophysiologist: New to Dr. Lovena Le  Primary Discharge Diagnosis:  CHB status post pacemaker implantation this admission  Secondary Discharge Diagnosis:  Dementia HTN OSA Confusion/agitation  Allergies  Allergen Reactions   Donepezil Other (See Comments)    Feb 2021 -Continuous headache while on donepezil that resolved when stopped taking it      Procedures This Admission:  1.  Implantation of a St. Jude dual chamber PPM on 02/03/2020 by Dr. Lovena Le.  The patient received a St. Jude model number [id number] PPM with model number [id number] right atrial lead and [id number] right ventricular lead. There were no immediate post procedure complications. 2.  CXR on 02/04/20 demonstrated no pneumothorax status post device implantation.   Brief HPI: Erika [name] is a 79 y.o. female was admitted for symptomatic bradycardia and electrophysiology team asked to see for consideration of PPM implantation in the setting of CHB.  Past medical history includes above.  The patient has had symptomatic bradycardia without reversible causes identified.  Risks, benefits, and alternatives to PPM implantation were reviewed with the patient who wished to proceed.   Hospital Course:  The patient was admitted and underwent implantation of a St. Jude dual chamber PPM with details as outlined above.  She was monitored on telemetry overnight which demonstrated appropriate pacing.  Left chest was without hematoma or ecchymosis.  The device was interrogated and found to be functioning normally.  CXR was obtained and demonstrated no pneumothorax status post device implantation.  Wound care, arm mobility, and restrictions were reviewed with the patient.     Patient had agitation overnight and given xanax x 2 and zyprexa x 1 (both new medications for her). Noted to have increased agitation throughout the am into the afternoon, prompting safety concerns for discharge from RN and family. It was suspected this was dementia exacerbated by the unfamiliar settings compounded by the medications above. IM consulted for further assessment.   On IM consultation, pt agitation had begun to gradually improvement, reinforcing that this was likely due to medications and dementia.  Family, IM, and EP are all in agreement to continue with discharge as initially planned.   Close follow up as below.   Pt is not on blood thinner therapy.  Physical Exam: Vitals:   02/04/20 0544 02/04/20 0545 02/04/20 0546 02/04/20 0547  BP:      Pulse:      Resp: (!) 24 16 (!) 28 19  Temp:      TempSrc:      SpO2:      Weight:      Height:        GEN- The patient is elderly appearing, alert and oriented x 3 today.   HEENT: normocephalic, atraumatic; sclera clear, conjunctiva pink; hearing intact; oropharynx clear; neck supple, no JVP Lymph- no cervical lymphadenopathy Lungs- Clear to ausculation bilaterally, normal work of breathing.  No wheezes, rales, rhonchi Heart- Regular rate and rhythm, no murmurs, rubs or gallops, PMI not laterally displaced GI- soft, non-tender, non-distended, bowel sounds present, no hepatosplenomegaly Extremities- no clubbing, cyanosis, or edema; DP/PT/radial pulses 2+ bilaterally MS- no significant deformity or atrophy Skin- warm and dry, no rash or lesion, left chest without hematoma/ecchymosis Psych- Pleasantly demented.  Neuro- strength and sensation are  intact   Labs:   Lab Results  Component Value Date   WBC 10.7 (H) 02/02/2020   HGB 12.6 02/02/2020   HCT 41.8 02/02/2020   MCV 91.1 02/02/2020   PLT 234 02/02/2020    Recent Labs  Lab 02/02/20 1312  NA 141  K 4.4  CL 103  CO2 24  BUN 25*  CREATININE 1.12*  CALCIUM 9.7   PROT 7.2  BILITOT 0.8  ALKPHOS 51  ALT 12  AST 22  GLUCOSE 113*    Discharge Medications:  Allergies as of 02/04/2020       Reactions   Donepezil Other (See Comments)   Feb 2021 -Continuous headache while on donepezil that resolved when stopped taking it         Medication List     TAKE these medications    acetaminophen 325 MG tablet Commonly known as: TYLENOL Take 2 tablets (650 mg total) by mouth every 4 (four) hours as needed for headache or mild pain.   alendronate 70 MG tablet Commonly known as: FOSAMAX Take 1 tablet (70 mg total) by mouth every 7 (seven) days. Take with a full glass of water on an empty stomach.   aspirin 81 MG tablet Take 1 tablet (81 mg total) by mouth daily.   Calcium 600+D 600-800 MG-UNIT Tabs Generic drug: Calcium Carb-Cholecalciferol Take 1 tablet by mouth every morning.   furosemide 20 MG tablet Commonly known as: LASIX Take 20 mg by mouth as needed for fluid.   HEALTHY COLON PO Take 1 capsule by mouth daily.   lamoTRIgine 25 MG tablet Commonly known as: LAMICTAL 1 tab bid x one week 2 tab bid x 2nd week 3 tab bid x 3rd week What changed: Another medication with the same name was removed. Continue taking this medication, and follow the directions you see here.   losartan 25 MG tablet Commonly known as: COZAAR Take 1 tablet (25 mg total) by mouth daily.   melatonin 5 MG Tabs Take 1 tablet (5 mg total) by mouth at bedtime. For 1 week. For further, Please discuss with PCP/Neurologist.   memantine 5 MG tablet Commonly known as: Namenda Take 1 tablet (5 mg total) by mouth 2 (two) times daily.   omeprazole 40 MG capsule Commonly known as: PRILOSEC TAKE 1 CAPSULE BY MOUTH  DAILY WITH BREAKFAST What changed: See the new instructions.   VITAMIN B 12 PO Take 1,000 mcg by mouth every evening.        Disposition:    Follow-up Information     Evans Lance, MD Follow up on 05/12/2020.   Specialty: Cardiology Why: at  345 for 3 month post pacemaker follow up Contact information: [address] [place] [address] [phone]         [facility] Follow up on 02/13/2020.   Why: at 4 pm for 10 day post pacemaker wound check.  Contact information: [place] [phone]           Duration of Discharge Encounter: Greater than 30 minutes including physician time.  Jacalyn Lefevre, PA-C  02/04/2020 2:43 PM  EP Attending  Patient seen and examined. Agree with the findings as noted above. The patient is s/p PPM for CHB and appears to be stable. Her PPM has been interrogated under my direction and the device is working normally. She will be discharged home with usual followup.  Mikle Bosworth.D.

## 2020-02-04 NOTE — Progress Notes (Signed)
Patient continues to show signs of confusion and poor safety compliance. Daughter at bedside expressing concerns for patient and her dad that live by themselves. Patient has shown a dramatic shift from baseline overnight post-op. PRN medications given [place] need additional time to get out of her system but EP PA contacted for further evaluation. Case Management also aware of possible need for [facility]. Will wait for internal medicine recommendations

## 2020-02-04 NOTE — Plan of Care (Signed)
  Problem: Education: Goal: Knowledge of General Education information will improve Description: Including pain rating scale, medication(s)/side effects and non-pharmacologic comfort measures Outcome: Adequate for Discharge   Problem: Health Behavior/Discharge Planning: Goal: Ability to manage health-related needs will improve Outcome: Adequate for Discharge   Problem: Clinical Measurements: Goal: Ability to maintain clinical measurements within normal limits will improve Outcome: Adequate for Discharge Goal: Will remain free from infection Outcome: Adequate for Discharge Goal: Diagnostic test results will improve Outcome: Adequate for Discharge Goal: Respiratory complications will improve Outcome: Adequate for Discharge Goal: Cardiovascular complication will be avoided Outcome: Adequate for Discharge   Problem: Activity: Goal: Risk for activity intolerance will decrease Outcome: Adequate for Discharge   Problem: Nutrition: Goal: Adequate nutrition will be maintained Outcome: Adequate for Discharge   Problem: Coping: Goal: Level of anxiety will decrease Outcome: Adequate for Discharge   Problem: Elimination: Goal: Will not experience complications related to bowel motility Outcome: Adequate for Discharge Goal: Will not experience complications related to urinary retention Outcome: Adequate for Discharge   Problem: Pain Managment: Goal: General experience of comfort will improve Outcome: Adequate for Discharge   Problem: Safety: Goal: Ability to remain free from injury will improve Outcome: Adequate for Discharge   Problem: Skin Integrity: Goal: Risk for impaired skin integrity will decrease Outcome: Adequate for Discharge   Problem: Education: Goal: Knowledge of cardiac device and self-care will improve Outcome: Adequate for Discharge Goal: Ability to safely manage health related needs after discharge will improve Outcome: Adequate for Discharge Goal:  Individualized Educational Video(s) Outcome: Adequate for Discharge   Problem: Cardiac: Goal: Ability to achieve and maintain adequate cardiopulmonary perfusion will improve Outcome: Adequate for Discharge   

## 2020-02-04 NOTE — Consult Note (Signed)
[place] [place] Consultation  CORRINNA ACHEAMPONG [id number] DOB: 1941-07-02 DOA: 02/02/2020 PCP: Hayden Rasmussen, MD   Requesting physician: Dr. Harl Bowie Date of consultation: 02/04/2020 Reason for consultation: Agitation  Impression/Recommendations Principal Problem:   Complete heart block ([place])   1. Delirium, likely from disturbed sleep cycle plus stress from hospitalization and PPM procedure.  Conversation patient patient daughter at bedside, impression is the patient has improved this afternoon and close to her mentation baseline.  Patient family further request the patient discharged home today.  Medically, patient has no symptoms or signs of active infection, her PPM insertion site clean, no dysuria no pain no cough no fever chills.  Medically cleared to be discharged home toady.  I recommend melatonin as needed for sleep and sent prescription to patient's pharmacy and discussed with patient regarding sleep hygiene. 2. Third-degree AV block status post PPM, as per primary team 3. Uncontrolled hypertension, continue to titrate BP meds as outpatient 4. New onset seizure, patient is still on lamotrigine as per neurology    Chief Complaint: Agitation  HPI:  79 y.o. female with medical history advanced dementia, hypertension, hyperlipidemia, new onset seizure, brought to ER 02/02/2020 with recurrent falls. Found to be bradycardia in 30s.  EKG showed third-degree AV block.  Patient subsequently went PPM yesterday afternoon.  Patient daughter reported that the patient has been feeling nervous and has not had any sleep for 2 nights after coming to hospital.  Last night, patient started to have episodes of confusion and agitation, and received multiple sedation medications including Xanax x2 and Zyprexa injection.  Review of Systems:  10 point review systems are negative except those mentioned in HPI  Past Medical History:  Diagnosis Date  . Absence of appendix, congenital   .  Arthritis   . Complete heart block ([place]) 02/02/2020  . GERD (gastroesophageal reflux disease)   . Hiatal hernia   . History of colon polyps   . History of gastric polyp   . Hypercholesterolemia   . Hypertension    PER PT NO MEDICINE  . Irritable bowel syndrome    resolved last few years  . Mobitz type 2 second degree heart block    intermittant,  cardiac work-up done;  Cardiac cath 10-02-2018,  echo 09-24-2018, event monitor 09-24-2018 (all results in epic)  . Neck pain    right sided neck pain  . OSA (obstructive sleep apnea)    does not use c-pap did not like  . Osteoporosis 06/2017   T score -2.8  . PSVT (paroxysmal supraventricular tachycardia) ([place])    short run on event monitor 09-24-2018  . Wears glasses    Past Surgical History:  Procedure Laterality Date  . CATARACT EXTRACTION W/ INTRAOCULAR LENS  IMPLANT, BILATERAL    . COLONOSCOPY    . cortisone shots in back    . ESOPHAGOGASTRODUODENOSCOPY    . ESOPHAGOGASTRODUODENOSCOPY N/A 08/14/2013   Procedure: ESOPHAGOGASTRODUODENOSCOPY (EGD);  Surgeon: Jeryl Columbia, MD;  Location: [facility];  Service: Endoscopy;  Laterality: N/A;  . ESOPHAGOGASTRODUODENOSCOPY (EGD) WITH PROPOFOL N/A 09/29/2015   Procedure: ESOPHAGOGASTRODUODENOSCOPY (EGD) WITH PROPOFOL;  Surgeon: Clarene Essex, MD;  Location: [facility];  Service: Endoscopy;  Laterality: N/A;  . FOOT SURGERY Right 06-30-2005   dr duda   osteotomy first and second toe's  . HIATAL HERNIA REPAIR N/A 10/29/2018   Procedure: LAPAROSCOPIC REPAIR OF HIATAL HERNIA WITH NISSEN FUNDOPLICATION, WITH MESH;  Surgeon: Excell Seltzer, MD;  Location: [facility];  Service: General;  Laterality: N/A;  .  HOT HEMOSTASIS N/A 08/14/2013   Procedure: HOT HEMOSTASIS (ARGON PLASMA COAGULATION/BICAP);  Surgeon: Jeryl Columbia, MD;  Location: [facility];  Service: Endoscopy;  Laterality: N/A;  . HOT HEMOSTASIS N/A 09/29/2015   Procedure: HOT HEMOSTASIS (ARGON PLASMA COAGULATION/BICAP);  Surgeon: Clarene Essex,  MD;  Location: [facility];  Service: Endoscopy;  Laterality: N/A;  . PACEMAKER IMPLANT N/A 02/03/2020   Procedure: PACEMAKER IMPLANT;  Surgeon: Evans Lance, MD;  Location: [facility];  Service: Cardiovascular;  Laterality: N/A;  . RIGHT/LEFT HEART CATH AND CORONARY ANGIOGRAPHY N/A 10/02/2018   Procedure: RIGHT/LEFT HEART CATH AND CORONARY ANGIOGRAPHY;  Surgeon: Troy Sine, MD;  Location: [facility];  Service: Cardiovascular;  Laterality: N/A;  . TUBAL LIGATION  yrs ago   Social History:  reports that she has never smoked. She has never used smokeless tobacco. She reports that she does not drink alcohol or use drugs.  Allergies  Allergen Reactions  . Donepezil Other (See Comments)    Feb 2021 -Continuous headache while on donepezil that resolved when stopped taking it    Family History  Problem Relation Age of Onset  . Heart disease Father        MI  . Kidney failure Mother        pregnant with twins  . Heart disease Paternal Uncle        MI  . Heart disease Paternal Uncle        MI  . Heart disease Paternal Uncle        MI  . Heart disease Paternal Uncle        MI  . Colon cancer Neg Hx   . Liver cancer Neg Hx   . Esophageal cancer Neg Hx   . Rectal cancer Neg Hx   . Stomach cancer Neg Hx     Prior to Admission medications   Medication Sig Start Date End Date Taking? Authorizing Provider  alendronate (FOSAMAX) 70 MG tablet Take 1 tablet (70 mg total) by mouth every 7 (seven) days. Take with a full glass of water on an empty stomach. 10/23/17  Yes Fontaine, Belinda Block, MD  aspirin 81 MG tablet Take 1 tablet (81 mg total) by mouth daily. 09/27/18  Yes Barrett, Evelene Croon, PA-C  Calcium Carb-Cholecalciferol (CALCIUM 600+D) 600-800 MG-UNIT TABS Take 1 tablet by mouth every morning.   Yes [provider]  Cyanocobalamin (VITAMIN B 12 PO) Take 1,000 mcg by mouth every evening.    Yes [provider]  furosemide (LASIX) 20 MG tablet Take 20 mg by  mouth as needed for fluid.  01/06/20  Yes [provider]  lamoTRIgine (LAMICTAL) 25 MG tablet 1 tab bid x one week 2 tab bid x 2nd week 3 tab bid x 3rd week 01/23/20  Yes Marcial Pacas, MD  losartan (COZAAR) 25 MG tablet Take 1 tablet (25 mg total) by mouth daily. 09/27/18 02/02/20 Yes Barrett, Evelene Croon, PA-C  memantine (NAMENDA) 5 MG tablet Take 1 tablet (5 mg total) by mouth 2 (two) times daily. 01/23/20  Yes Marcial Pacas, MD  omeprazole (PRILOSEC) 40 MG capsule TAKE 1 CAPSULE BY MOUTH  DAILY WITH BREAKFAST Patient taking differently: Take 40 mg by mouth daily.  11/11/19  Yes Irene Shipper, MD  Probiotic Product (HEALTHY COLON PO) Take 1 capsule by mouth daily.    Yes [provider]  acetaminophen (TYLENOL) 325 MG tablet Take 2 tablets (650 mg total) by mouth every 4 (four) hours  as needed for headache or mild pain. 02/04/20   Shirley Friar, PA-C  lamoTRIgine (LAMICTAL) 100 MG tablet Take 1 tablet (100 mg total) by mouth 2 (two) times daily. 01/23/20   Marcial Pacas, MD  melatonin 5 MG TABS Take 1 tablet (5 mg total) by mouth at bedtime. For 1 week. For further, Please discuss with PCP/Neurologist. 02/04/20   Shirley Friar, PA-C   Physical Exam: Blood pressure (!) 184/94, pulse 76, temperature 97.8 F (36.6 C), temperature source Oral, resp. rate 19, height 5\' 5"  (1.651 m), weight 57.6 kg, SpO2 (!) 53 %. Vitals:   02/04/20 0546 02/04/20 0547  BP:    Pulse:    Resp: (!) 28 19  Temp:    SpO2:       General: No acute distress  Eyes: PERRL  ENT: Mucous moist  Neck: Supple no JVD  Cardiovascular: Paced  Respiratory: No crackles no wheezing  Abdomen: Soft nontender nondistended  Skin: No rash, no ulcers  Musculoskeletal: Muscle tone normal  Psychiatric: Calm, following simple commands  Neurologic: No focal deficit  Labs on Admission:  Basic Metabolic Panel: Recent Labs  Lab 02/02/20 1312  NA 141  K 4.4  CL 103  CO2 24  GLUCOSE 113*  BUN 25*   CREATININE 1.12*  CALCIUM 9.7  MG 1.9   Liver Function Tests: Recent Labs  Lab 02/02/20 1312  AST 22  ALT 12  ALKPHOS 51  BILITOT 0.8  PROT 7.2  ALBUMIN 4.2   No results for input(s): LIPASE, AMYLASE in the last 168 hours. No results for input(s): AMMONIA in the last 168 hours. CBC: Recent Labs  Lab 02/02/20 1312  WBC 10.7*  NEUTROABS 8.4*  HGB 12.6  HCT 41.8  MCV 91.1  PLT 234   Cardiac Enzymes: No results for input(s): CKTOTAL, CKMB, CKMBINDEX, TROPONINI in the last 168 hours. BNP: Invalid input(s): POCBNP CBG: Recent Labs  Lab 02/03/20 1221  GLUCAP 105*    Radiological Exams on Admission: DG Chest 2 View  Result Date: 02/04/2020 CLINICAL DATA:  Pacemaker placement yesterday. EXAM: CHEST - 2 VIEW COMPARISON:  Chest x-ray dated [place] 9, 2021. FINDINGS: New dual lead left chest wall pacemaker with leads terminating in the right atrium and right ventricle. Unchanged mild cardiomegaly. Normal pulmonary vascularity. Unchanged mild bibasilar atelectasis. No focal consolidation, pleural effusion, or pneumothorax. Unchanged elevation of the right hemidiaphragm. No acute osseous abnormality. Chronic L1 compression deformity. IMPRESSION: 1. New left chest wall pacemaker without complicating feature. 2. Unchanged mild bibasilar atelectasis. Electronically Signed   By: Titus Dubin M.D.   On: 02/04/2020 09:49   EP PPM/ICD IMPLANT  Result Date: 02/04/2020 CONCLUSIONS:  1. Successful implantation of a St. Jude dual-chamber pacemaker for symptomatic bradycardia due to CHB  2. No early apparent complications.       [name], MD 02/03/2020 6:33 PM    EKG: Independently reviewed.  Paced  Time spent: [facility] Pager [phone]   02/04/2020, 2:54 PM

## 2020-02-04 NOTE — Progress Notes (Signed)
BP (!) 184/94 (BP Location: Right Arm) Comment: pt agitated   Pulse 76   Temp 97.8 F (36.6 C) (Oral)   Resp 19   Ht 5\' 5"  (1.651 m)   Wt 57.6 kg   SpO2 (!) 53%   BMI 21.13 kg/m    MD aware of blood pressure  pt is agitated Will continue to monitor  Will reassess

## 2020-02-13 ENCOUNTER — Ambulatory Visit (INDEPENDENT_AMBULATORY_CARE_PROVIDER_SITE_OTHER): Payer: Medicare Other | Admitting: Emergency Medicine

## 2020-02-13 ENCOUNTER — Other Ambulatory Visit: Payer: Self-pay

## 2020-02-13 DIAGNOSIS — I442 Atrioventricular block, complete: Secondary | ICD-10-CM | POA: Diagnosis not present

## 2020-02-14 LAB — CUP PACEART INCLINIC DEVICE CHECK
Battery Remaining Longevity: 50 mo
Battery Voltage: 3.04 V
Brady Statistic RA Percent Paced: 0.81 %
Brady Statistic RV Percent Paced: 99 %
Date Time Interrogation Session: 20210520162500
Implantable Lead Implant Date: 20210510
Implantable Lead Implant Date: 20210510
Implantable Lead Location: 753859
Implantable Lead Location: 753860
Implantable Pulse Generator Implant Date: 20210510
Lead Channel Impedance Value: 475 Ohm
Lead Channel Impedance Value: 562.5 Ohm
Lead Channel Pacing Threshold Amplitude: 0.5 V
Lead Channel Pacing Threshold Amplitude: 0.5 V
Lead Channel Pacing Threshold Pulse Width: 0.5 ms
Lead Channel Pacing Threshold Pulse Width: 1 ms
Lead Channel Sensing Intrinsic Amplitude: 5 mV
Lead Channel Setting Pacing Amplitude: 3.5 V
Lead Channel Setting Pacing Amplitude: 3.5 V
Lead Channel Setting Pacing Pulse Width: 1 ms
Lead Channel Setting Sensing Sensitivity: 4 mV
Pulse Gen Model: 2272
Pulse Gen Serial Number: 3829878

## 2020-02-14 NOTE — Progress Notes (Signed)
Wound check appointment. Steri-strips removed. Wound without redness or edema. Incision edges approximated, wound well healed. Normal device function. Thresholds, sensing, and impedances consistent with implant measurements. Device programmed in RA  at 3.5V/auto capture programmed on for extra safety margin until 3 month visit. RV lead initial programmed at 5 V at 1 ms  at implant. RV output programmed to 3.5 V @ 1 ms today due stable RV thresholds and ability to follow arm restrictions. Histogram distribution appropriate for patient and level of activity. No mode switches or high ventricular rates noted. Patient educated about wound care, arm mobility, lifting restrictions. ROV with Dr Lovena Le on 05/12/20. Next remote transmission on 05/04/20 and every 3 months there after.

## 2020-02-20 ENCOUNTER — Other Ambulatory Visit (HOSPITAL_COMMUNITY): Payer: Self-pay | Admitting: Family Medicine

## 2020-02-20 ENCOUNTER — Other Ambulatory Visit: Payer: Self-pay

## 2020-02-20 ENCOUNTER — Ambulatory Visit (HOSPITAL_COMMUNITY)
Admission: RE | Admit: 2020-02-20 | Discharge: 2020-02-20 | Disposition: A | Discharge status: Home / Self Care | Payer: Medicare Other | Source: Ambulatory Visit | Attending: Vascular Surgery | Admitting: Vascular Surgery

## 2020-02-20 DIAGNOSIS — M79662 Pain in left lower leg: Secondary | ICD-10-CM

## 2020-02-20 DIAGNOSIS — M7989 Other specified soft tissue disorders: Secondary | ICD-10-CM | POA: Insufficient documentation

## 2020-03-02 ENCOUNTER — Other Ambulatory Visit: Payer: Self-pay

## 2020-03-02 ENCOUNTER — Ambulatory Visit (INDEPENDENT_AMBULATORY_CARE_PROVIDER_SITE_OTHER): Payer: Medicare Other | Admitting: Neurology

## 2020-03-02 DIAGNOSIS — R410 Disorientation, unspecified: Secondary | ICD-10-CM | POA: Diagnosis not present

## 2020-03-02 DIAGNOSIS — F0390 Unspecified dementia without behavioral disturbance: Secondary | ICD-10-CM

## 2020-03-02 DIAGNOSIS — R6889 Other general symptoms and signs: Secondary | ICD-10-CM

## 2020-03-16 ENCOUNTER — Telehealth: Payer: Self-pay | Admitting: Neurology

## 2020-03-16 NOTE — Telephone Encounter (Signed)
Patient daughter Santiago Glad called stating her mom had an EEG done 2 weeks ago and has not heard the results from it and she sent a mychart message as well.. she stated her mom has an appointment this week and would like those results to have to take to that appointment.

## 2020-03-16 NOTE — Telephone Encounter (Addendum)
Follow up appt in Nov

## 2020-03-17 NOTE — Procedures (Signed)
   HISTORY: 79 year old female with history of dementia, presented with recurrent episode of transient visual loss, confusion,  TECHNIQUE:  This is a routine 16 channel EEG recording with one channel devoted to a limited EKG recording.  It was performed during wakefulness, drowsiness and asleep.  Hyperventilation and photic stimulation were performed as activating procedures.  There are frequent bilateral frontal eye blinking artifact  Upon maximum arousal, posterior dominant waking rhythm consistent of mildly dysrhythmic activity, with mixed theta and alpha range activity. Activities are symmetric over the bilateral posterior derivations and attenuated with eye opening.  Hyperventilation produced mild/moderate buildup with higher amplitude and the slower activities noted.  Photic stimulation did not alter the tracing.  During EEG recording, patient developed drowsiness and no deeper stage of sleep was achieved  EKG demonstrate sinus rhythm, with heart rate of 56 bpm  CONCLUSION: This is a slight abnormal awake EEG.  There is no electrodiagnostic evidence of epileptiform discharge.  There is evidence of mild background slowing, suggestive of mild bihemispheric malfunction.  Marcial Pacas, M.D. Ph.D.  [facility] [place], [place] [address] Phone: [phone] Fax:      [phone]

## 2020-03-17 NOTE — Telephone Encounter (Addendum)
The appt is scheduled on 07/27/2020.

## 2020-03-17 NOTE — Telephone Encounter (Signed)
I have called her daughter Santiago Glad at both phone number listed, EEG showed no significant abnormality  Also left a message for her to call back if she has any questions

## 2020-03-18 NOTE — Progress Notes (Signed)
[place]:  EEG showed no significant abnormalities.  If you have questions please feel free to call my office.  Marcial Pacas, M.D. Ph.D.  [facility] [place], [place] [address] Phone: [phone] Fax:      [phone]

## 2020-03-18 NOTE — Telephone Encounter (Signed)
Pt's daughter called needing to speak to the provider about the next step that is needing to be taken since the tests are not showing what can be going on. Please advise.

## 2020-03-25 NOTE — Telephone Encounter (Signed)
I was able to talk with her daughter Erika [name], a week after initial visit with me on January 23 2020,   [place] 10 2021, she fell twice in the morning, was found to have bradycardia HR 31 bpm, she had pacemaker,   "Seen by Dr. Lovena Le at hospital Advanced AV block including CHB Conduction disease at baseline with Mobitz II on 09/05/2018. Now with CHB She has clear breakfast ordered and is NPO for procedure today.  Bilateral IVs in place Discussed risks and benefits with patient. In setting of dementia, will discuss further with family once available.  Pt currently on Dobutamine 20 mcg/kg/min with no improvement in HR. She was NOT on AV nodal blocking agents.   Implantation of a St. Jude dual chamber PPM on 02/03/2020 by Dr. Lovena Le.  The patient received a St. Jude model number [id number] PPM with model number [id number] right atrial lead and [id number] right ventricular lead. There were no immediate post procedure complications. 2.  CXR on 02/04/20 demonstrated no pneumothorax status post device implantation."   Patient had agitation overnight and given xanax x 2 and zyprexa x 1 (both new medications for her). Noted to have increased agitation throughout the am into the afternoon, prompting safety concerns for discharge from RN and family. It was suspected this was dementia exacerbated by the unfamiliar settings compounded by the medications above. IM consulted for further assessment  During her hospital stay following pacemaker, she developed significant agitation, paradoxical increased agitation after she was given benzodiazepine, eventually she was discharged home, had a few rough nights, now has much improved, sleep well now,  She is on memantine 5 mg twice a day, (daughter reported pay about $50 each month), I have advised her, if there was no significant improvement after memantine, it is okay to stop the medications, if needed, we can call in 85-month supply at her mail order  She is also on lamotrigine  100 mg twice a day, there was no significant side effect noted, with her improving mood, less agitation, I have advised her stop morning dose, only take lamotrigine 100 mg once at night  Follow-up visit on July 27, 2020

## 2020-04-28 ENCOUNTER — Telehealth: Payer: Self-pay

## 2020-04-28 NOTE — Telephone Encounter (Signed)
The pt daughter Loni Muse called having questions about the coverage of the monitor. I told her I will have someone billing give her a call back. Santiago Glad number is [phone].

## 2020-05-01 ENCOUNTER — Telehealth: Payer: Self-pay

## 2020-05-01 ENCOUNTER — Other Ambulatory Visit: Payer: Self-pay | Admitting: Internal Medicine

## 2020-05-01 NOTE — Telephone Encounter (Signed)
LMOVM for pt daughter Santiago Glad to give me a call about the pt monitor box.

## 2020-05-04 ENCOUNTER — Ambulatory Visit (INDEPENDENT_AMBULATORY_CARE_PROVIDER_SITE_OTHER): Payer: Medicare Other | Admitting: *Deleted

## 2020-05-04 DIAGNOSIS — I442 Atrioventricular block, complete: Secondary | ICD-10-CM

## 2020-05-04 LAB — CUP PACEART REMOTE DEVICE CHECK
Battery Remaining Longevity: 54 mo
Battery Remaining Percentage: 95.5 %
Battery Voltage: 2.99 V
Brady Statistic AP VP Percent: 13 %
Brady Statistic AP VS Percent: 1 %
Brady Statistic AS VP Percent: 85 %
Brady Statistic AS VS Percent: 1 %
Brady Statistic RA Percent Paced: 12 %
Brady Statistic RV Percent Paced: 99 %
Date Time Interrogation Session: 20210809020017
Implantable Lead Implant Date: 20210510
Implantable Lead Implant Date: 20210510
Implantable Lead Location: 753859
Implantable Lead Location: 753860
Implantable Pulse Generator Implant Date: 20210510
Lead Channel Impedance Value: 590 Ohm
Lead Channel Impedance Value: 610 Ohm
Lead Channel Pacing Threshold Amplitude: 0.5 V
Lead Channel Pacing Threshold Amplitude: 0.5 V
Lead Channel Pacing Threshold Pulse Width: 0.5 ms
Lead Channel Pacing Threshold Pulse Width: 1 ms
Lead Channel Sensing Intrinsic Amplitude: 12 mV
Lead Channel Sensing Intrinsic Amplitude: 5 mV
Lead Channel Setting Pacing Amplitude: 3.5 V
Lead Channel Setting Pacing Amplitude: 3.5 V
Lead Channel Setting Pacing Pulse Width: 1 ms
Lead Channel Setting Sensing Sensitivity: 4 mV
Pulse Gen Model: 2272
Pulse Gen Serial Number: 3829878

## 2020-05-05 NOTE — Progress Notes (Signed)
Remote pacemaker transmission.   

## 2020-05-08 DIAGNOSIS — R634 Abnormal weight loss: Secondary | ICD-10-CM | POA: Diagnosis not present

## 2020-05-08 DIAGNOSIS — I10 Essential (primary) hypertension: Secondary | ICD-10-CM | POA: Diagnosis not present

## 2020-05-12 ENCOUNTER — Encounter: Payer: No Typology Code available for payment source | Admitting: Internal Medicine

## 2020-06-22 DIAGNOSIS — Z23 Encounter for immunization: Secondary | ICD-10-CM | POA: Diagnosis not present

## 2020-07-17 ENCOUNTER — Encounter: Payer: No Typology Code available for payment source | Admitting: Internal Medicine

## 2020-07-27 ENCOUNTER — Encounter: Payer: Self-pay | Admitting: Neurology

## 2020-07-27 ENCOUNTER — Ambulatory Visit (INDEPENDENT_AMBULATORY_CARE_PROVIDER_SITE_OTHER): Payer: Medicare Other | Admitting: Neurology

## 2020-07-27 ENCOUNTER — Encounter: Payer: No Typology Code available for payment source | Admitting: Internal Medicine

## 2020-07-27 VITALS — BP 153/83 | HR 59 | Ht 65.0 in | Wt 129.5 lb

## 2020-07-27 DIAGNOSIS — Z950 Presence of cardiac pacemaker: Secondary | ICD-10-CM | POA: Diagnosis not present

## 2020-07-27 DIAGNOSIS — F0390 Unspecified dementia without behavioral disturbance: Secondary | ICD-10-CM

## 2020-07-27 MED ORDER — MEMANTINE HCL 10 MG PO TABS: 10.0000 mg | ORAL_TABLET | Freq: Two times a day (BID) | ORAL | 4 refills | Status: DC

## 2020-07-27 NOTE — Progress Notes (Signed)
PATIENT: Erika [name] DOB: 12-Sep-1941  Chief Complaint  Patient presents with  . Dementia/Episodes of visual loss    She is here with her daugher, Erika [name]. She would like to review MRI brain, EEG and overall prognosis. Last MOCA was 8/30.      HISTORICAL  Erika [name] is a 79 year old female, seen in request by her primary care physician Dr. Darron Doom, Maebelle Munroe accompanied by her daughter Erika [name] at today's clinical visit on January 23, 2020.  I have reviewed and summarized the referring note from the referring physician.  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  Her parents died when she was young there was no clear history of dementia.  She was noted to have sudden onset memory loss, language difficulty, personality change since 2016.  Her daughter reported that she had word finding difficulties, also get frustrated easily, especially with her husband, she is no longer driving, got lost, could not take care of her household bill anymore,  In addition, there was reported spells of sudden onset visual loss, confusion, staring into space since November 2020, it happened once to twice each month, sudden onset confusion, increased language difficulty, sometimes staring into space, lasting for couple minute, followed by fatigue, increased confusion afterwards  She was diagnosed with dementia, was started on Aricept since November 2020, while she was taking 5 mg, she seems tolerating it well, 1 it was increased to 10 mg in February 2021, she began to complains of constant head pressure at the top of her vertex region, which has improved within 1 week after stopping Aricept.  I personally reviewed MRI of the brain with without contrast in December 2020, no acute abnormality, generalized atrophy, mild to moderate supratentorium small vessel disease  Today's  marker examination is only 8 out of 30  Lab in Feb 2021, normal cbc, cmp, b12.folic acid  UPDATE Jul 27 2020: She is accompanied by her daughter Erika [name] at today's clinical visit, She is now taking nemaneda 10mg  bid, tolerate it will, aricept cause headahces Pacemaker was put in [place] 2021, for slow heart rate in 30s, she started to have frequent passing out spells, sudden onset increased confusion, Since pacemaker placement in [place], she is doing much better, she has more energy, no longer has passing out spells, no reported event the most likely related to her hypoperfusion, bradycardia,  EEG on March 02, 2020 showed mild background slowing  REVIEW OF SYSTEMS: Full 14 system review of systems performed and notable only for as above All other review of systems were negative.  ALLERGIES: Allergies  Allergen Reactions  . Donepezil Other (See Comments)    Feb 2021 -Continuous headache while on donepezil that resolved when stopped taking it     HOME MEDICATIONS: Current Outpatient Medications  Medication Sig Dispense Refill  . acetaminophen (TYLENOL) 325 MG tablet Take 2 tablets (650 mg total) by mouth every 4 (four) hours as needed for headache or mild pain.    Marland Kitchen aspirin 81 MG tablet Take 1 tablet (81 mg total) by mouth daily. 90 tablet 1  . Calcium Carb-Cholecalciferol (CALCIUM 600+D) 600-800 MG-UNIT TABS Take 1 tablet by mouth every morning.    . Cyanocobalamin (VITAMIN B 12 PO) Take 1,000 mcg by mouth every evening.     . lamoTRIgine (LAMICTAL) 100 MG tablet Take 100 mg by mouth 2 (  two) times daily.    . memantine (NAMENDA) 5 MG tablet Take 1 tablet (5 mg total) by mouth 2 (two) times daily. 60 tablet 11  . metoprolol succinate (TOPROL-XL) 25 MG 24 hr tablet Take 25 mg by mouth daily.    Marland Kitchen omeprazole (PRILOSEC) 40 MG capsule Take 1 capsule (40 mg total) by mouth daily before breakfast. **PLEASE SCHEDULE FOLLOW UP FOR FURTHER REFILLS** 90 capsule 0  . Probiotic Product (HEALTHY COLON PO) Take  1 capsule by mouth daily.     Marland Kitchen VITAMIN D PO Take 1 tablet by mouth daily.    Marland Kitchen losartan (COZAAR) 25 MG tablet Take 1 tablet (25 mg total) by mouth daily. 90 tablet 3   No current facility-administered medications for this visit.    PAST MEDICAL HISTORY: Past Medical History:  Diagnosis Date  . Absence of appendix, congenital   . Arthritis   . Complete heart block ([place]) 02/02/2020  . GERD (gastroesophageal reflux disease)   . Hiatal hernia   . History of colon polyps   . History of gastric polyp   . Hypercholesterolemia   . Hypertension    PER PT NO MEDICINE  . Irritable bowel syndrome    resolved last few years  . Mobitz type 2 second degree heart block    intermittant,  cardiac work-up done;  Cardiac cath 10-02-2018,  echo 09-24-2018, event monitor 09-24-2018 (all results in epic)  . Neck pain    right sided neck pain  . OSA (obstructive sleep apnea)    does not use c-pap did not like  . Osteoporosis 06/2017   T score -2.8  . PSVT (paroxysmal supraventricular tachycardia) ([place])    short run on event monitor 09-24-2018  . Wears glasses     PAST SURGICAL HISTORY: Past Surgical History:  Procedure Laterality Date  . CATARACT EXTRACTION W/ INTRAOCULAR LENS  IMPLANT, BILATERAL    . COLONOSCOPY    . cortisone shots in back    . ESOPHAGOGASTRODUODENOSCOPY    . ESOPHAGOGASTRODUODENOSCOPY N/A 08/14/2013   Procedure: ESOPHAGOGASTRODUODENOSCOPY (EGD);  Surgeon: Jeryl Columbia, MD;  Location: [facility];  Service: Endoscopy;  Laterality: N/A;  . ESOPHAGOGASTRODUODENOSCOPY (EGD) WITH PROPOFOL N/A 09/29/2015   Procedure: ESOPHAGOGASTRODUODENOSCOPY (EGD) WITH PROPOFOL;  Surgeon: Clarene Essex, MD;  Location: [facility];  Service: Endoscopy;  Laterality: N/A;  . FOOT SURGERY Right 06-30-2005   dr duda   osteotomy first and second toe's  . HIATAL HERNIA REPAIR N/A 10/29/2018   Procedure: LAPAROSCOPIC REPAIR OF HIATAL HERNIA WITH NISSEN FUNDOPLICATION, WITH MESH;  Surgeon: Excell Seltzer, MD;  Location: [facility];  Service: General;  Laterality: N/A;  . HOT HEMOSTASIS N/A 08/14/2013   Procedure: HOT HEMOSTASIS (ARGON PLASMA COAGULATION/BICAP);  Surgeon: Jeryl Columbia, MD;  Location: [facility];  Service: Endoscopy;  Laterality: N/A;  . HOT HEMOSTASIS N/A 09/29/2015   Procedure: HOT HEMOSTASIS (ARGON PLASMA COAGULATION/BICAP);  Surgeon: Clarene Essex, MD;  Location: [facility];  Service: Endoscopy;  Laterality: N/A;  . PACEMAKER IMPLANT N/A 02/03/2020   Procedure: PACEMAKER IMPLANT;  Surgeon: Evans Lance, MD;  Location: [place] [facility];  Service: Cardiovascular;  Laterality: N/A;  . RIGHT/LEFT HEART CATH AND CORONARY ANGIOGRAPHY N/A 10/02/2018   Procedure: RIGHT/LEFT HEART CATH AND CORONARY ANGIOGRAPHY;  Surgeon: Troy Sine, MD;  Location: [place] [facility];  Service: Cardiovascular;  Laterality: N/A;  . TUBAL LIGATION  yrs ago    FAMILY HISTORY: Family History  Problem Relation Age of Onset  .  Heart disease Father        MI  . Kidney failure Mother        pregnant with twins  . Heart disease Paternal Uncle        MI  . Heart disease Paternal Uncle        MI  . Heart disease Paternal Uncle        MI  . Heart disease Paternal Uncle        MI  . Colon cancer Neg Hx   . Liver cancer Neg Hx   . Esophageal cancer Neg Hx   . Rectal cancer Neg Hx   . Stomach cancer Neg Hx     SOCIAL HISTORY: Social History   Socioeconomic History  . Marital status: Married    Spouse name: Eddie Dibbles  . Number of children: 1  . Years of education: 62  . Highest education level: Not on file  Occupational History    Employer: RETIRED    Comment: Retired  Tobacco Use  . Smoking status: Never Smoker  . Smokeless tobacco: Never Used  Substance and Sexual Activity  . Alcohol use: No  . Drug use: No  . Sexual activity: Never    Comment: 1st intercourse 43 yo-1 partner  Other Topics Concern  . Not on file  Social History Narrative   . Advised patient of provider's  approval for requested procedure, as well as any comments/instructions from provider.       Provided patient w/ verbal instructions concerning pre-, intra- and post-procedure preparation and instructions.      Patient verbalized understanding of the above.    Patient lives at home with her husband Eddie Dibbles. Patient has a high school education. Patient is retired.   Right handed.   Caffeine-None   Social Determinants of Health   Financial Resource Strain:   . Difficulty of Paying Living Expenses:   Food Insecurity:   . Worried About Charity fundraiser in the Last Year:   . Arboriculturist in the Last Year:   Transportation Needs:   . Film/video editor (Medical):   Marland Kitchen Lack of Transportation (Non-Medical):   Physical Activity:   . Days of Exercise per Week:   . Minutes of Exercise per Session:   Stress:   . Feeling of Stress :   Social Connections:   . Frequency of Communication with Friends and Family:   . Frequency of Social Gatherings with Friends and Family:   . Attends Religious Services:   . Active Member of Clubs or Organizations:   . Attends Archivist Meetings:   Marland Kitchen Marital Status:   Intimate Partner Violence:   . Fear of Current or Ex-Partner:   . Emotionally Abused:   Marland Kitchen Physically Abused:   . Sexually Abused:      PHYSICAL EXAM   Vitals:   07/27/20 1532  BP: (!) 153/83  Pulse: (!) 59  Weight: 129 lb 8 oz (58.7 kg)  Height: 5\' 5"  (1.651 m)   Not recorded     Body mass index is 21.55 kg/m.  PHYSICAL EXAMNIATION:  Gen: NAD, conversant, well nourised, well groomed                     Cardiovascular: Regular rate rhythm, no peripheral edema, warm, nontender. Eyes: Conjunctivae clear without exudates or hemorrhage Neck: Supple, no carotid bruits. Pulmonary: Clear to auscultation bilaterally   NEUROLOGICAL EXAM:   MMSE - Mini Mental State Exam  07/27/2020  Orientation to time 5  Orientation to Place 3  Registration 3  Attention/  Calculation 5  Recall 0  Language- name 2 objects 2  Language- repeat 1  Language- follow 3 step command 3  Language- read & follow direction 1  Write a sentence 1  Copy design 1  Total score 25    CRANIAL NERVES: CN II: Visual fields are full to confrontation. Pupils are round equal and briskly reactive to light. CN III, IV, VI: extraocular movement are normal. No ptosis. CN V: Facial sensation is intact to light touch CN VII: Face is symmetric with normal eye closure  CN VIII: Mild hard of hearing, bilateral cerum CN IX, X: Phonation is normal. CN XI: Head turning and shoulder shrug are intact  MOTOR: There is no pronator drift of out-stretched arms. Muscle bulk and tone are normal. Muscle strength is normal.  REFLEXES: Reflexes are 2+ and symmetric at the biceps, triceps, knees, and ankles. Plantar responses are flexor.  SENSORY: Intact to light touch, pinprick and vibratory sensation are intact in fingers and toes.  COORDINATION: There is no trunk or limb dysmetria noted.  GAIT/STANCE: Need push-up to get up from seated position, mildly unsteady   DIAGNOSTIC DATA (LABS, IMAGING, TESTING) - I reviewed patient records, labs, notes, testing and imaging myself where available.   ASSESSMENT AND PLAN  Miosotis Wetsel Sustaita is a 79 y.o. female    Dementia  MoCA examination is 25/30 today, much improved compared to previous study  Personally reviewed MRI of the brain December 2020, mild generalized atrophy, supratentorium small vessel disease  Laboratory evaluation showed no treatable etiology  Recurrent episode of transient visual loss, confusion, postevent confusion  That much improved with pacemaker for bradycardia with heart rate of 30s,   Will stop lamotrigine  EEG was normal in June 2021  Marcial Pacas, M.D. Ph.D.  [facility] [address], [place], [place] [address] Ph: [phone] Fax: [phone]  CC: Hayden Rasmussen, MD

## 2020-07-31 ENCOUNTER — Encounter: Payer: Self-pay | Admitting: Internal Medicine

## 2020-07-31 ENCOUNTER — Other Ambulatory Visit: Payer: Self-pay

## 2020-07-31 ENCOUNTER — Ambulatory Visit (INDEPENDENT_AMBULATORY_CARE_PROVIDER_SITE_OTHER): Payer: Medicare Other | Admitting: Internal Medicine

## 2020-07-31 VITALS — BP 146/84 | HR 73 | Ht 65.0 in | Wt 130.0 lb

## 2020-07-31 DIAGNOSIS — I10 Essential (primary) hypertension: Secondary | ICD-10-CM | POA: Diagnosis not present

## 2020-07-31 DIAGNOSIS — Z950 Presence of cardiac pacemaker: Secondary | ICD-10-CM

## 2020-07-31 DIAGNOSIS — I442 Atrioventricular block, complete: Secondary | ICD-10-CM

## 2020-07-31 LAB — CUP PACEART INCLINIC DEVICE CHECK
Battery Remaining Longevity: 91 mo
Battery Voltage: 2.99 V
Brady Statistic RA Percent Paced: 13 %
Brady Statistic RV Percent Paced: 99.11 %
Date Time Interrogation Session: 20211105170850
Implantable Lead Implant Date: 20210510
Implantable Lead Implant Date: 20210510
Implantable Lead Location: 753859
Implantable Lead Location: 753860
Implantable Pulse Generator Implant Date: 20210510
Lead Channel Impedance Value: 612.5 Ohm
Lead Channel Impedance Value: 612.5 Ohm
Lead Channel Pacing Threshold Amplitude: 0.5 V
Lead Channel Pacing Threshold Amplitude: 0.5 V
Lead Channel Pacing Threshold Amplitude: 0.75 V
Lead Channel Pacing Threshold Amplitude: 0.75 V
Lead Channel Pacing Threshold Pulse Width: 0.5 ms
Lead Channel Pacing Threshold Pulse Width: 0.5 ms
Lead Channel Pacing Threshold Pulse Width: 1 ms
Lead Channel Pacing Threshold Pulse Width: 1 ms
Lead Channel Sensing Intrinsic Amplitude: 12 mV
Lead Channel Sensing Intrinsic Amplitude: 4.2 mV
Lead Channel Setting Pacing Amplitude: 2 V
Lead Channel Setting Pacing Amplitude: 2.5 V
Lead Channel Setting Pacing Pulse Width: 1 ms
Lead Channel Setting Sensing Sensitivity: 4 mV
Pulse Gen Model: 2272
Pulse Gen Serial Number: 3829878

## 2020-07-31 MED ORDER — LOSARTAN POTASSIUM 50 MG PO TABS
50.0000 mg | ORAL_TABLET | Freq: Every day | ORAL | 3 refills | Status: DC
Start: 2020-07-31 — End: 2021-07-29

## 2020-07-31 NOTE — Progress Notes (Signed)
HPI Erika [name] returns today for followup after undergoing PPM insertion approximately 4 months ago. She feels much better. SHe denies chest pain, sob, or peripheral edema. Her daughter notes that her thinking is improved but her bp is increased. No syncope. No edema.  Allergies  Allergen Reactions  . Donepezil Other (See Comments)    Feb 2021 -Continuous headache while on donepezil that resolved when stopped taking it      Current Outpatient Medications  Medication Sig Dispense Refill  . acetaminophen (TYLENOL) 325 MG tablet Take 2 tablets (650 mg total) by mouth every 4 (four) hours as needed for headache or mild pain.    Marland Kitchen aspirin 81 MG tablet Take 1 tablet (81 mg total) by mouth daily. 90 tablet 1  . Calcium Carb-Cholecalciferol (CALCIUM 600+D) 600-800 MG-UNIT TABS Take 1 tablet by mouth every morning.    . Cyanocobalamin (VITAMIN B 12 PO) Take 1,000 mcg by mouth every evening.     . lamoTRIgine (LAMICTAL) 100 MG tablet Take 100 mg by mouth 2 (two) times daily.    Marland Kitchen losartan (COZAAR) 25 MG tablet Take 1 tablet (25 mg total) by mouth daily. 90 tablet 3  . memantine (NAMENDA) 10 MG tablet Take 1 tablet (10 mg total) by mouth 2 (two) times daily. 180 tablet 4  . metoprolol succinate (TOPROL-XL) 25 MG 24 hr tablet Take 25 mg by mouth daily.    Marland Kitchen omeprazole (PRILOSEC) 40 MG capsule Take 1 capsule (40 mg total) by mouth daily before breakfast. **PLEASE SCHEDULE FOLLOW UP FOR FURTHER REFILLS** 90 capsule 0  . Probiotic Product (HEALTHY COLON PO) Take 1 capsule by mouth daily.     Marland Kitchen VITAMIN D PO Take 1 tablet by mouth daily.     No current facility-administered medications for this visit.     Past Medical History:  Diagnosis Date  . Absence of appendix, congenital   . Arthritis   . Complete heart block ([place]) 02/02/2020  . GERD (gastroesophageal reflux disease)   . Hiatal hernia   . History of colon polyps   . History of gastric polyp   . Hypercholesterolemia   . Hypertension     PER PT NO MEDICINE  . Irritable bowel syndrome    resolved last few years  . Mobitz type 2 second degree heart block    intermittant,  cardiac work-up done;  Cardiac cath 10-02-2018,  echo 09-24-2018, event monitor 09-24-2018 (all results in epic)  . Neck pain    right sided neck pain  . OSA (obstructive sleep apnea)    does not use c-pap did not like  . Osteoporosis 06/2017   T score -2.8  . PSVT (paroxysmal supraventricular tachycardia) ([place])    short run on event monitor 09-24-2018  . Wears glasses     ROS:   All systems reviewed and negative except as noted in the HPI.   Past Surgical History:  Procedure Laterality Date  . CATARACT EXTRACTION W/ INTRAOCULAR LENS  IMPLANT, BILATERAL    . COLONOSCOPY    . cortisone shots in back    . ESOPHAGOGASTRODUODENOSCOPY    . ESOPHAGOGASTRODUODENOSCOPY N/A 08/14/2013   Procedure: ESOPHAGOGASTRODUODENOSCOPY (EGD);  Surgeon: Jeryl Columbia, MD;  Location: [facility];  Service: Endoscopy;  Laterality: N/A;  . ESOPHAGOGASTRODUODENOSCOPY (EGD) WITH PROPOFOL N/A 09/29/2015   Procedure: ESOPHAGOGASTRODUODENOSCOPY (EGD) WITH PROPOFOL;  Surgeon: Clarene Essex, MD;  [facility]: [facility];  Service: Endoscopy;  Laterality: N/A;  . FOOT SURGERY Right 06-30-2005  dr duda   osteotomy first and second toe's  . HIATAL HERNIA REPAIR N/A 10/29/2018   Procedure: LAPAROSCOPIC REPAIR OF HIATAL HERNIA WITH NISSEN FUNDOPLICATION, WITH MESH;  Surgeon: Excell Seltzer, MD;  Location: [facility];  Service: General;  Laterality: N/A;  . HOT HEMOSTASIS N/A 08/14/2013   Procedure: HOT HEMOSTASIS (ARGON PLASMA COAGULATION/BICAP);  Surgeon: Jeryl Columbia, MD;  Location: [facility];  Service: Endoscopy;  Laterality: N/A;  . HOT HEMOSTASIS N/A 09/29/2015   Procedure: HOT HEMOSTASIS (ARGON PLASMA COAGULATION/BICAP);  Surgeon: Clarene Essex, MD;  Location: [facility];  Service: Endoscopy;  Laterality: N/A;  . PACEMAKER IMPLANT N/A 02/03/2020   Procedure: PACEMAKER IMPLANT;   Surgeon: Evans Lance, MD;  Location: [place] [facility];  Service: Cardiovascular;  Laterality: N/A;  . RIGHT/LEFT HEART CATH AND CORONARY ANGIOGRAPHY N/A 10/02/2018   Procedure: RIGHT/LEFT HEART CATH AND CORONARY ANGIOGRAPHY;  Surgeon: Troy Sine, MD;  Location: [place] [facility];  Service: Cardiovascular;  Laterality: N/A;  . TUBAL LIGATION  yrs ago     Family History  Problem Relation Age of Onset  . Heart disease Father        MI  . Kidney failure Mother        pregnant with twins  . Heart disease Paternal Uncle        MI  . Heart disease Paternal Uncle        MI  . Heart disease Paternal Uncle        MI  . Heart disease Paternal Uncle        MI  . Colon cancer Neg Hx   . Liver cancer Neg Hx   . Esophageal cancer Neg Hx   . Rectal cancer Neg Hx   . Stomach cancer Neg Hx        BP (!) 146/84   Pulse 73   Ht 5\' 5"  (1.651 m)   Wt 130 lb (59 kg)   SpO2 99%   BMI 21.63 kg/m   Physical Exam:  Well appearing NAD HEENT: Unremarkable Neck:  6 cm JVD, no thyromegally Lymphatics:  No adenopathy Back:  No CVA tenderness Lungs:  Clear with no wheezes HEART:  Regular rate rhythm, no murmurs, no rubs, no clicks Abd:  soft, positive bowel sounds, no organomegally, no rebound, no guarding Ext:  2 plus pulses, no edema, no cyanosis, no clubbing Skin:  No rashes no nodules Neuro:  CN II through XII intact, motor grossly intact  EKG - NSR with ventricular pacing  DEVICE  Normal device function.  See PaceArt for details.   Assess/Plan: 1. CHB - she is asymptomatic, s/p PPM insertion. She has no escape today. 2. HTN - her bp is elevated. We discussed medication adjustment and she will increase her dose of losartan to 50 mg daily. She will return for a bmp and bp check. 3. PPM - her St. Jude DDD PM is working normally.  4. Dementia - she is on namenda and she improved her neuro function after PPM insertion.  Erika [name] Erika Huebert,MD

## 2020-07-31 NOTE — Patient Instructions (Addendum)
Medication Instructions:  Your physician has recommended you make the following change in your medication:   1.  INCREASE your losartan--Take 50 mg by mouth once a day.   Labwork: BMP and blood pressure check in 2 weeks August 13, 2020 at 4:00 pm  Testing/Procedures: None ordered.  Follow-Up: Your physician wants you to follow-up in: one year with Dr. Lovena Le.   You will receive a reminder letter in the mail two months in advance. If you don't receive a letter, please call our office to schedule the follow-up appointment.  Remote monitoring is used to monitor your Pacemaker from home. This monitoring reduces the number of office visits required to check your device to one time per year. It allows [place] to keep an eye on the functioning of your device to ensure it is working properly. You are scheduled for a device check from home on 08/03/2020. You Saylor send your transmission at any time that day. If you have a wireless device, the transmission will be sent automatically. After your physician reviews your transmission, you will receive a postcard with your next transmission date.  Any Other Special Instructions Will Be Listed Below (If Applicable).  If you need a refill on your cardiac medications before your next appointment, please call your pharmacy.

## 2020-08-03 ENCOUNTER — Ambulatory Visit (INDEPENDENT_AMBULATORY_CARE_PROVIDER_SITE_OTHER): Payer: Medicare Other

## 2020-08-03 DIAGNOSIS — I442 Atrioventricular block, complete: Secondary | ICD-10-CM | POA: Diagnosis not present

## 2020-08-04 LAB — CUP PACEART REMOTE DEVICE CHECK
Battery Remaining Longevity: 92 mo
Battery Remaining Percentage: 95.5 %
Battery Voltage: 2.99 V
Brady Statistic AP VP Percent: 7.3 %
Brady Statistic AP VS Percent: 1 %
Brady Statistic AS VP Percent: 92 %
Brady Statistic AS VS Percent: 1 %
Brady Statistic RA Percent Paced: 7 %
Brady Statistic RV Percent Paced: 99 %
Date Time Interrogation Session: 20211108023734
Implantable Lead Implant Date: 20210510
Implantable Lead Implant Date: 20210510
Implantable Lead Location: 753859
Implantable Lead Location: 753860
Implantable Pulse Generator Implant Date: 20210510
Lead Channel Impedance Value: 590 Ohm
Lead Channel Impedance Value: 630 Ohm
Lead Channel Pacing Threshold Amplitude: 0.5 V
Lead Channel Pacing Threshold Amplitude: 0.75 V
Lead Channel Pacing Threshold Pulse Width: 0.5 ms
Lead Channel Pacing Threshold Pulse Width: 1 ms
Lead Channel Sensing Intrinsic Amplitude: 12 mV
Lead Channel Sensing Intrinsic Amplitude: 4.7 mV
Lead Channel Setting Pacing Amplitude: 2 V
Lead Channel Setting Pacing Amplitude: 2.5 V
Lead Channel Setting Pacing Pulse Width: 1 ms
Lead Channel Setting Sensing Sensitivity: 4 mV
Pulse Gen Model: 2272
Pulse Gen Serial Number: 3829878

## 2020-08-05 NOTE — Progress Notes (Signed)
Remote pacemaker transmission.   

## 2020-08-13 ENCOUNTER — Other Ambulatory Visit: Payer: Self-pay

## 2020-08-13 ENCOUNTER — Other Ambulatory Visit: Payer: Medicare Other | Admitting: *Deleted

## 2020-08-13 ENCOUNTER — Ambulatory Visit: Payer: Medicare Other

## 2020-08-13 DIAGNOSIS — I10 Essential (primary) hypertension: Secondary | ICD-10-CM

## 2020-08-13 NOTE — Progress Notes (Signed)
1.) Reason for visit: recheck blood pressure after med change  2.) Name of MD requesting visit: Dr. Lovena Le  3.) H&P: Pt with hypertension  4.) ROS related to problem: At last office visit 07/31/2020 Pt blood pressure 146/84.  Dr. Lovena Le increased Pt's losartan to 50 mg PO daily.  5.) Assessment and plan per MD: Pt arrives for BP check and BMP.  Pt states she has been doing well on the increased dose of losartan.   BP check today is 140/76.  Dr. Lovena Le notified.  No additional changes recommended.  Pt will follow up in one year pending lab results.

## 2020-08-13 NOTE — Patient Instructions (Signed)
Follow-up in one year.

## 2020-08-14 LAB — BASIC METABOLIC PANEL
BUN/Creatinine Ratio: 19 (ref 12–28)
BUN: 21 mg/dL (ref 8–27)
CO2: 26 mmol/L (ref 20–29)
Calcium: 9.7 mg/dL (ref 8.7–10.3)
Chloride: 100 mmol/L (ref 96–106)
Creatinine, Ser: 1.12 mg/dL — ABNORMAL HIGH (ref 0.57–1.00)
GFR calc Af Amer: 53 mL/min/{1.73_m2} — ABNORMAL LOW (ref >59)
GFR calc non Af Amer: 46 mL/min/{1.73_m2} — ABNORMAL LOW (ref >59)
Glucose: 93 mg/dL (ref 65–99)
Potassium: 4.6 mmol/L (ref 3.5–5.2)
Sodium: 139 mmol/L (ref 134–144)

## 2020-08-31 ENCOUNTER — Other Ambulatory Visit: Payer: Self-pay

## 2020-08-31 DIAGNOSIS — Z950 Presence of cardiac pacemaker: Secondary | ICD-10-CM | POA: Diagnosis not present

## 2020-08-31 DIAGNOSIS — Z1389 Encounter for screening for other disorder: Secondary | ICD-10-CM | POA: Diagnosis not present

## 2020-08-31 DIAGNOSIS — I10 Essential (primary) hypertension: Secondary | ICD-10-CM | POA: Diagnosis not present

## 2020-08-31 DIAGNOSIS — Z0000 Encounter for general adult medical examination without abnormal findings: Secondary | ICD-10-CM | POA: Diagnosis not present

## 2020-08-31 DIAGNOSIS — F0390 Unspecified dementia without behavioral disturbance: Secondary | ICD-10-CM | POA: Diagnosis not present

## 2020-10-02 ENCOUNTER — Other Ambulatory Visit: Payer: Self-pay | Admitting: Internal Medicine

## 2020-10-23 DIAGNOSIS — I10 Essential (primary) hypertension: Secondary | ICD-10-CM | POA: Diagnosis not present

## 2020-10-23 DIAGNOSIS — I442 Atrioventricular block, complete: Secondary | ICD-10-CM | POA: Diagnosis not present

## 2020-10-23 DIAGNOSIS — Z950 Presence of cardiac pacemaker: Secondary | ICD-10-CM | POA: Diagnosis not present

## 2020-11-02 ENCOUNTER — Ambulatory Visit (INDEPENDENT_AMBULATORY_CARE_PROVIDER_SITE_OTHER): Payer: Medicare Other

## 2020-11-02 DIAGNOSIS — I442 Atrioventricular block, complete: Secondary | ICD-10-CM

## 2020-11-04 LAB — CUP PACEART REMOTE DEVICE CHECK
Battery Remaining Longevity: 94 mo
Battery Remaining Percentage: 95.5 %
Battery Voltage: 3.01 V
Brady Statistic AP VP Percent: 10 %
Brady Statistic AP VS Percent: 1 %
Brady Statistic AS VP Percent: 89 %
Brady Statistic AS VS Percent: 1 %
Brady Statistic RA Percent Paced: 10 %
Brady Statistic RV Percent Paced: 99 %
Date Time Interrogation Session: 20220207032312
Implantable Lead Implant Date: 20210510
Implantable Lead Implant Date: 20210510
Implantable Lead Location: 753859
Implantable Lead Location: 753860
Implantable Pulse Generator Implant Date: 20210510
Lead Channel Impedance Value: 530 Ohm
Lead Channel Impedance Value: 540 Ohm
Lead Channel Pacing Threshold Amplitude: 0.5 V
Lead Channel Pacing Threshold Amplitude: 0.75 V
Lead Channel Pacing Threshold Pulse Width: 0.5 ms
Lead Channel Pacing Threshold Pulse Width: 1 ms
Lead Channel Sensing Intrinsic Amplitude: 12 mV
Lead Channel Sensing Intrinsic Amplitude: 2.1 mV
Lead Channel Setting Pacing Amplitude: 2 V
Lead Channel Setting Pacing Amplitude: 2.5 V
Lead Channel Setting Pacing Pulse Width: 1 ms
Lead Channel Setting Sensing Sensitivity: 4 mV
Pulse Gen Model: 2272
Pulse Gen Serial Number: 3829878

## 2020-11-09 NOTE — Progress Notes (Signed)
Remote pacemaker transmission.   

## 2021-01-11 DIAGNOSIS — I10 Essential (primary) hypertension: Secondary | ICD-10-CM | POA: Diagnosis not present

## 2021-01-11 DIAGNOSIS — K219 Gastro-esophageal reflux disease without esophagitis: Secondary | ICD-10-CM | POA: Diagnosis not present

## 2021-02-01 ENCOUNTER — Ambulatory Visit (INDEPENDENT_AMBULATORY_CARE_PROVIDER_SITE_OTHER): Payer: Medicare Other

## 2021-02-01 DIAGNOSIS — I442 Atrioventricular block, complete: Secondary | ICD-10-CM

## 2021-02-02 LAB — CUP PACEART REMOTE DEVICE CHECK
Battery Remaining Longevity: 94 mo
Battery Remaining Percentage: 95.5 %
Battery Voltage: 3.01 V
Brady Statistic AP VP Percent: 9.3 %
Brady Statistic AP VS Percent: 1 %
Brady Statistic AS VP Percent: 90 %
Brady Statistic AS VS Percent: 1 %
Brady Statistic RA Percent Paced: 8.8 %
Brady Statistic RV Percent Paced: 99 %
Date Time Interrogation Session: 20220509030336
Implantable Lead Implant Date: 20210510
Implantable Lead Implant Date: 20210510
Implantable Lead Location: 753859
Implantable Lead Location: 753860
Implantable Pulse Generator Implant Date: 20210510
Lead Channel Impedance Value: 540 Ohm
Lead Channel Impedance Value: 540 Ohm
Lead Channel Pacing Threshold Amplitude: 0.5 V
Lead Channel Pacing Threshold Amplitude: 0.75 V
Lead Channel Pacing Threshold Pulse Width: 0.5 ms
Lead Channel Pacing Threshold Pulse Width: 1 ms
Lead Channel Sensing Intrinsic Amplitude: 1.5 mV
Lead Channel Sensing Intrinsic Amplitude: 12 mV
Lead Channel Setting Pacing Amplitude: 2 V
Lead Channel Setting Pacing Amplitude: 2.5 V
Lead Channel Setting Pacing Pulse Width: 1 ms
Lead Channel Setting Sensing Sensitivity: 4 mV
Pulse Gen Model: 2272
Pulse Gen Serial Number: 3829878

## 2021-02-03 ENCOUNTER — Other Ambulatory Visit: Payer: Self-pay | Admitting: Neurology

## 2021-02-05 DIAGNOSIS — N1832 Chronic kidney disease, stage 3b: Secondary | ICD-10-CM | POA: Diagnosis not present

## 2021-02-05 DIAGNOSIS — I129 Hypertensive chronic kidney disease with stage 1 through stage 4 chronic kidney disease, or unspecified chronic kidney disease: Secondary | ICD-10-CM | POA: Diagnosis not present

## 2021-02-11 ENCOUNTER — Other Ambulatory Visit: Payer: Self-pay | Admitting: Neurology

## 2021-02-11 ENCOUNTER — Telehealth: Payer: Self-pay | Admitting: Neurology

## 2021-02-11 NOTE — Telephone Encounter (Addendum)
The patient was previously on lamotrigine 100mg , BID for passing out spells. These spells resolved after having a pacemaker placement. There is a documented conversation on 03/16/21 with Dr. Krista Blue and the patient's dgt where the lamotrigine was decreased to 100mg  at bedtime. The patient was then seen in the office on 07/27/20 where the medication was stopped (only taking one tab QHS at that time).  Her daughter states the patient never discontinued the medication. She is still taking 100mg  at bedtime. She feels it is helpful with improving her mood and decreasing agitation. She is asking for her to remain on the medication.  Dr. Krista Blue reviewed chart. Per her vo, she will provide a refill of lamotrigine 100mg , one tab QHS. The patient should keep her follow up on 07/29/21. Rx sent to pharmacy.

## 2021-02-11 NOTE — Telephone Encounter (Signed)
Pt's daughter(on DPR) is asking for a call from RN to discuss the lamoTRIgine (LAMICTAL) 100 MG tablet .  She said pt has been taking the medication since Nov and if pt was no longer to take the medication she should have gradually been stepped down, please call.

## 2021-02-23 NOTE — Progress Notes (Signed)
Remote pacemaker transmission.   

## 2021-03-05 DIAGNOSIS — N1832 Chronic kidney disease, stage 3b: Secondary | ICD-10-CM | POA: Diagnosis not present

## 2021-03-05 DIAGNOSIS — F0390 Unspecified dementia without behavioral disturbance: Secondary | ICD-10-CM | POA: Diagnosis not present

## 2021-03-05 DIAGNOSIS — I129 Hypertensive chronic kidney disease with stage 1 through stage 4 chronic kidney disease, or unspecified chronic kidney disease: Secondary | ICD-10-CM | POA: Diagnosis not present

## 2021-04-30 DIAGNOSIS — Z23 Encounter for immunization: Secondary | ICD-10-CM | POA: Diagnosis not present

## 2021-05-03 ENCOUNTER — Ambulatory Visit (INDEPENDENT_AMBULATORY_CARE_PROVIDER_SITE_OTHER): Payer: Medicare Other

## 2021-05-03 DIAGNOSIS — I442 Atrioventricular block, complete: Secondary | ICD-10-CM

## 2021-05-04 LAB — CUP PACEART REMOTE DEVICE CHECK
Battery Remaining Longevity: 79 mo
Battery Remaining Percentage: 85 %
Battery Voltage: 3.01 V
Brady Statistic AP VP Percent: 14 %
Brady Statistic AP VS Percent: 1 %
Brady Statistic AS VP Percent: 86 %
Brady Statistic AS VS Percent: 1 %
Brady Statistic RA Percent Paced: 13 %
Brady Statistic RV Percent Paced: 99 %
Date Time Interrogation Session: 20220808020014
Implantable Lead Implant Date: 20210510
Implantable Lead Implant Date: 20210510
Implantable Lead Location: 753859
Implantable Lead Location: 753860
Implantable Pulse Generator Implant Date: 20210510
Lead Channel Impedance Value: 530 Ohm
Lead Channel Impedance Value: 590 Ohm
Lead Channel Pacing Threshold Amplitude: 0.5 V
Lead Channel Pacing Threshold Amplitude: 0.75 V
Lead Channel Pacing Threshold Pulse Width: 0.5 ms
Lead Channel Pacing Threshold Pulse Width: 1 ms
Lead Channel Sensing Intrinsic Amplitude: 12 mV
Lead Channel Sensing Intrinsic Amplitude: 3.4 mV
Lead Channel Setting Pacing Amplitude: 2 V
Lead Channel Setting Pacing Amplitude: 2.5 V
Lead Channel Setting Pacing Pulse Width: 1 ms
Lead Channel Setting Sensing Sensitivity: 4 mV
Pulse Gen Model: 2272
Pulse Gen Serial Number: 3829878

## 2021-05-27 NOTE — Progress Notes (Signed)
Remote pacemaker transmission.   

## 2021-07-01 DIAGNOSIS — Z23 Encounter for immunization: Secondary | ICD-10-CM | POA: Diagnosis not present

## 2021-07-12 DIAGNOSIS — I129 Hypertensive chronic kidney disease with stage 1 through stage 4 chronic kidney disease, or unspecified chronic kidney disease: Secondary | ICD-10-CM | POA: Diagnosis not present

## 2021-07-12 DIAGNOSIS — N1832 Chronic kidney disease, stage 3b: Secondary | ICD-10-CM | POA: Diagnosis not present

## 2021-07-28 ENCOUNTER — Ambulatory Visit: Payer: Medicare Other | Admitting: Neurology

## 2021-07-29 ENCOUNTER — Encounter: Payer: Self-pay | Admitting: Neurology

## 2021-07-29 ENCOUNTER — Ambulatory Visit (INDEPENDENT_AMBULATORY_CARE_PROVIDER_SITE_OTHER): Payer: Medicare Other | Admitting: Neurology

## 2021-07-29 VITALS — BP 146/75 | HR 64 | Ht 63.5 in | Wt 138.0 lb

## 2021-07-29 DIAGNOSIS — F0280 Dementia in other diseases classified elsewhere without behavioral disturbance: Secondary | ICD-10-CM | POA: Diagnosis not present

## 2021-07-29 DIAGNOSIS — G301 Alzheimer's disease with late onset: Secondary | ICD-10-CM

## 2021-07-29 DIAGNOSIS — Z950 Presence of cardiac pacemaker: Secondary | ICD-10-CM | POA: Diagnosis not present

## 2021-07-29 MED ORDER — LAMOTRIGINE 100 MG PO TABS: 100.0000 mg | ORAL_TABLET | Freq: Every day | ORAL | 4 refills | Status: DC

## 2021-07-29 MED ORDER — MEMANTINE HCL 10 MG PO TABS: 10.0000 mg | ORAL_TABLET | Freq: Two times a day (BID) | ORAL | 4 refills | Status: DC

## 2021-07-29 NOTE — Progress Notes (Signed)
PATIENT: Erika [name] DOB: 06-20-1941  Chief Complaint  Patient presents with   Follow-up    RM 59, with daughter. Feels stable since last visit.     HISTORICAL  Erika [name] is a 80 year old female, seen in request by her primary care physician Dr. Darron Doom, Maebelle Munroe accompanied by her daughter Erika [name] at today's clinical visit on January 23, 2020.  I have reviewed and summarized the referring note from the referring physician.  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  Her parents died when she was young there was no clear history of dementia.  She was noted to have sudden onset memory loss, language difficulty, personality change since 2016.  Her daughter reported that she had word finding difficulties, also get frustrated easily, especially with her husband, she is no longer driving, got lost, could not take care of her household bill anymore,  In addition, there was reported spells of sudden onset visual loss, confusion, staring into space since November 2020, it happened once to twice each month, sudden onset confusion, increased language difficulty, sometimes staring into space, lasting for couple minute, followed by fatigue, increased confusion afterwards  She was diagnosed with dementia, was started on Aricept since November 2020, while she was taking 5 mg, she seems tolerating it well, 1 it was increased to 10 mg in February 2021, she began to complains of constant head pressure at the top of her vertex region, which has improved within 1 week after stopping Aricept.  I personally reviewed MRI of the brain with without contrast in December 2020, no acute abnormality, generalized atrophy, mild to moderate supratentorium small vessel disease  Today's marker examination is only 8 out of 30  Lab in Feb 2021, normal cbc, cmp, b12.folic  acid  UPDATE Jul 27 2020: She is accompanied by her daughter Erika [name] at today's clinical visit, She is now taking nemaneda 10mg  bid, tolerate it will, aricept cause headahces Pacemaker was put in [place] 2021, for slow heart rate in 30s, she started to have frequent passing out spells, sudden onset increased confusion, Since pacemaker placement in [place], she is doing much better, she has more energy, no longer has passing out spells, no reported event the most likely related to her hypoperfusion, bradycardia,  EEG on March 02, 2020 showed mild background slowing  UPDATE Jul 29 2021: She lives at home with her husband, no longer driving, still can carry on normal conversation, but often struggling for word, on namenda, aricept cause headache, no longer taking Aricept, lamotrigine helps her mood,   REVIEW OF SYSTEMS: Full 14 system review of systems performed and notable only for as above All other review of systems were negative.  ALLERGIES: Allergies  Allergen Reactions   Donepezil Other (See Comments)    Feb 2021 -Continuous headache while on donepezil that resolved when stopped taking it     HOME MEDICATIONS: Current Outpatient Medications  Medication Sig Dispense Refill   acetaminophen (TYLENOL) 325 MG tablet Take 2 tablets (650 mg total) by mouth every 4 (four) hours as needed for headache or mild pain.     Calcium Carb-Cholecalciferol 600-800 MG-UNIT TABS Take 1 tablet by mouth every morning.     Cyanocobalamin (VITAMIN B 12 PO) Take 1,000 mcg by mouth every evening.      lamoTRIgine (LAMICTAL) 100 MG tablet Take 1 tablet (  100 mg total) by mouth at bedtime. 30 tablet 5   memantine (NAMENDA) 10 MG tablet Take 1 tablet (10 mg total) by mouth 2 (two) times daily. 180 tablet 4   metoprolol succinate (TOPROL-XL) 25 MG 24 hr tablet Take 25 mg by mouth daily.     omeprazole (PRILOSEC) 40 MG capsule Take 1 capsule (40 mg total) by mouth daily before breakfast. **PLEASE SCHEDULE FOLLOW UP FOR FURTHER  REFILLS** 90 capsule 0   Probiotic Product (HEALTHY COLON PO) Take 1 capsule by mouth daily.      VITAMIN D PO Take 1 tablet by mouth daily.     No current facility-administered medications for this visit.    PAST MEDICAL HISTORY: Past Medical History:  Diagnosis Date   Absence of appendix, congenital    Arthritis    Complete heart block ([place]) 02/02/2020   GERD (gastroesophageal reflux disease)    Hiatal hernia    History of colon polyps    History of gastric polyp    Hypercholesterolemia    Hypertension    PER PT NO MEDICINE   Irritable bowel syndrome    resolved last few years   Mobitz type 2 second degree heart block    intermittant,  cardiac work-up done;  Cardiac cath 10-02-2018,  echo 09-24-2018, event monitor 09-24-2018 (all results in epic)   Neck pain    right sided neck pain   OSA (obstructive sleep apnea)    does not use c-pap did not like   Osteoporosis 06/2017   T score -2.8   PSVT (paroxysmal supraventricular tachycardia) ([place])    short run on event monitor 09-24-2018   Wears glasses     PAST SURGICAL HISTORY: Past Surgical History:  Procedure Laterality Date   CATARACT EXTRACTION W/ INTRAOCULAR LENS  IMPLANT, BILATERAL     COLONOSCOPY     cortisone shots in back     ESOPHAGOGASTRODUODENOSCOPY     ESOPHAGOGASTRODUODENOSCOPY N/A 08/14/2013   Procedure: ESOPHAGOGASTRODUODENOSCOPY (EGD);  Surgeon: Jeryl Columbia, MD;  Location: [facility];  Service: Endoscopy;  Laterality: N/A;   ESOPHAGOGASTRODUODENOSCOPY (EGD) WITH PROPOFOL N/A 09/29/2015   Procedure: ESOPHAGOGASTRODUODENOSCOPY (EGD) WITH PROPOFOL;  Surgeon: Clarene Essex, MD;  Location: [facility];  Service: Endoscopy;  Laterality: N/A;   FOOT SURGERY Right 06-30-2005   dr duda   osteotomy first and second toe's   HIATAL HERNIA REPAIR N/A 10/29/2018   Procedure: LAPAROSCOPIC REPAIR OF HIATAL HERNIA WITH NISSEN FUNDOPLICATION, WITH MESH;  Surgeon: Excell Seltzer, MD;  Location: [facility];  Service: General;   Laterality: N/A;   HOT HEMOSTASIS N/A 08/14/2013   Procedure: HOT HEMOSTASIS (ARGON PLASMA COAGULATION/BICAP);  Surgeon: Jeryl Columbia, MD;  Location: [facility];  Service: Endoscopy;  Laterality: N/A;   HOT HEMOSTASIS N/A 09/29/2015   Procedure: HOT HEMOSTASIS (ARGON PLASMA COAGULATION/BICAP);  Surgeon: Clarene Essex, MD;  Location: [facility];  Service: Endoscopy;  Laterality: N/A;   PACEMAKER IMPLANT N/A 02/03/2020   Procedure: PACEMAKER IMPLANT;  Surgeon: Evans Lance, MD;  Location: [facility];  Service: Cardiovascular;  Laterality: N/A;   RIGHT/LEFT HEART CATH AND CORONARY ANGIOGRAPHY N/A 10/02/2018   Procedure: RIGHT/LEFT HEART CATH AND CORONARY ANGIOGRAPHY;  Surgeon: Troy Sine, MD;  Location: [facility];  Service: Cardiovascular;  Laterality: N/A;   TUBAL LIGATION  yrs ago    FAMILY HISTORY: Family History  Problem Relation Age of Onset   Heart disease Father        MI   Kidney  failure Mother        pregnant with twins   Heart disease Paternal Uncle        MI   Heart disease Paternal Uncle        MI   Heart disease Paternal Uncle        MI   Heart disease Paternal Uncle        MI   Colon cancer Neg Hx    Liver cancer Neg Hx    Esophageal cancer Neg Hx    Rectal cancer Neg Hx    Stomach cancer Neg Hx     SOCIAL HISTORY: Social History   Socioeconomic History   Marital status: Married    Spouse name: Eddie Dibbles   Number of children: 1   Years of education: 12   Highest education level: Not on file  Occupational History    Employer: RETIRED    Comment: Retired  Tobacco Use   Smoking status: Never Smoker   Smokeless tobacco: Never Used  Substance and Sexual Activity   Alcohol use: No   Drug use: No   Sexual activity: Never    Comment: 1st intercourse 28 yo-1 partner  Other Topics Concern   Not on file  Social History Narrative   . Advised patient of provider's approval for requested procedure, as well as any comments/instructions from  provider.       Provided patient w/ verbal instructions concerning pre-, intra- and post-procedure preparation and instructions.      Patient verbalized understanding of the above.    Patient lives at home with her husband Eddie Dibbles. Patient has a high school education. Patient is retired.   Right handed.   Caffeine-None   Social Determinants of Radio broadcast assistant Strain:    Difficulty of Paying Living Expenses:   Food Insecurity:    Worried About Charity fundraiser in the Last Year:    Arboriculturist in the Last Year:   Transportation Needs:    Film/video editor (Medical):    Lack of Transportation (Non-Medical):   Physical Activity:    Days of Exercise per Week:    Minutes of Exercise per Session:   Stress:    Feeling of Stress :   Social Connections:    Frequency of Communication with Friends and Family:    Frequency of Social Gatherings with Friends and Family:    Attends Religious Services:    Active Member of Clubs or Organizations:    Attends Music therapist:    Marital Status:   Intimate Partner Violence:    Fear of Current or Ex-Partner:    Emotionally Abused:    Physically Abused:    Sexually Abused:      PHYSICAL EXAM   There were no vitals filed for this visit.  Not recorded     There is no height or weight on file to calculate BMI.  PHYSICAL EXAMNIATION:  Gen: NAD, conversant, well nourised, well groomed              NEUROLOGICAL EXAM:  Montreal Cognitive Assessment  07/29/2021 01/23/2020  Visuospatial/ Executive (0/5) 1 0  Naming (0/3) 3 1  Attention: Read list of digits (0/2) 1 2  Attention: Read list of letters (0/1) 1 0  Attention: Serial 7 subtraction starting at 100 (0/3) 1 0  Language: Repeat phrase (0/2) 0 1  Language : Fluency (0/1) 0 0  Abstraction (0/2) 0 0  Delayed Recall (0/5) 0 0  Orientation (0/6) 5 4  Total 12 8    CRANIAL NERVES: CN II: Visual fields are full to confrontation. Pupils are round  equal and briskly reactive to light. CN III, IV, VI: extraocular movement are normal. No ptosis. CN V: Facial sensation is intact to light touch CN VII: Face is symmetric with normal eye closure  CN VIII: Mild hard of hearing, bilateral cerum CN IX, X: Phonation is normal. CN XI: Head turning and shoulder shrug are intact  MOTOR: There is no pronator drift of out-stretched arms. Muscle bulk and tone are normal. Muscle strength is normal.  REFLEXES: Reflexes are 2+ and symmetric at the biceps, triceps, knees, and ankles. Plantar responses are flexor.  SENSORY: Intact to light touch, pinprick and vibratory sensation are intact in fingers and toes.  COORDINATION: There is no trunk or limb dysmetria noted.  GAIT/STANCE: Need push-up to get up from seated position, mildly unsteady   DIAGNOSTIC DATA (LABS, IMAGING, TESTING) - I reviewed patient records, labs, notes, testing and imaging myself where available.   ASSESSMENT AND PLAN  Suhayla Chisom Cogan is a 80 y.o. female    Dementia  MoCA examination is 12/30,    Personally reviewed MRI of the brain December 2020, mild generalized atrophy, supratentorium small vessel disease  Laboratory evaluation showed no treatable etiology  Most consistent with central nervous system degenerative disorder, likely Alzheimer's disease,  Recurrent episode of transient visual loss, confusion, postevent confusion  That much improved with pacemaker for bradycardia with heart rate of 30s,   EEG was normal in June 2021  She was transiently treated with lamotrigine 100 mg twice daily, I have suggested her to taper off lamotrigine, but daughter reported lamotrigine has really helped her mood, now taking 100 every night,  Will continue follow-up with her primary care physician, only return to clinic for new issues  Marcial Pacas, M.D. Ph.D.  [facility] [address], [place], [place] [address] Ph: [phone] Fax:  [phone]  CC: Hayden Rasmussen, MD

## 2021-08-02 ENCOUNTER — Ambulatory Visit (INDEPENDENT_AMBULATORY_CARE_PROVIDER_SITE_OTHER): Payer: Medicare Other

## 2021-08-02 DIAGNOSIS — I442 Atrioventricular block, complete: Secondary | ICD-10-CM

## 2021-08-02 LAB — CUP PACEART REMOTE DEVICE CHECK
Battery Remaining Longevity: 73 mo
Battery Remaining Percentage: 82 %
Battery Voltage: 3.01 V
Brady Statistic AP VP Percent: 13 %
Brady Statistic AP VS Percent: 1 %
Brady Statistic AS VP Percent: 86 %
Brady Statistic AS VS Percent: 1 %
Brady Statistic RA Percent Paced: 13 %
Brady Statistic RV Percent Paced: 99 %
Date Time Interrogation Session: 20221107012936
Implantable Lead Implant Date: 20210510
Implantable Lead Implant Date: 20210510
Implantable Lead Location: 753859
Implantable Lead Location: 753860
Implantable Pulse Generator Implant Date: 20210510
Lead Channel Impedance Value: 480 Ohm
Lead Channel Impedance Value: 510 Ohm
Lead Channel Pacing Threshold Amplitude: 0.5 V
Lead Channel Pacing Threshold Amplitude: 0.75 V
Lead Channel Pacing Threshold Pulse Width: 0.5 ms
Lead Channel Pacing Threshold Pulse Width: 1 ms
Lead Channel Sensing Intrinsic Amplitude: 12 mV
Lead Channel Sensing Intrinsic Amplitude: 2.7 mV
Lead Channel Setting Pacing Amplitude: 2 V
Lead Channel Setting Pacing Amplitude: 2.5 V
Lead Channel Setting Pacing Pulse Width: 1 ms
Lead Channel Setting Sensing Sensitivity: 4 mV
Pulse Gen Model: 2272
Pulse Gen Serial Number: 3829878

## 2021-08-05 ENCOUNTER — Other Ambulatory Visit: Payer: Self-pay

## 2021-08-05 ENCOUNTER — Ambulatory Visit (INDEPENDENT_AMBULATORY_CARE_PROVIDER_SITE_OTHER): Payer: Medicare Other | Admitting: Internal Medicine

## 2021-08-05 ENCOUNTER — Encounter: Payer: Self-pay | Admitting: Internal Medicine

## 2021-08-05 VITALS — BP 122/68 | HR 64 | Ht 63.5 in | Wt 138.0 lb

## 2021-08-05 DIAGNOSIS — I442 Atrioventricular block, complete: Secondary | ICD-10-CM

## 2021-08-05 DIAGNOSIS — Z950 Presence of cardiac pacemaker: Secondary | ICD-10-CM | POA: Diagnosis not present

## 2021-08-05 NOTE — Progress Notes (Signed)
HPI [name] returns today for followup after undergoing PPM insertion approximately 18 months ago. She feels much better. SHe denies chest pain, sob, or peripheral edema. Her daughter notes that her thinking is good and bp is improved. No syncope. No edema.  Allergies  Allergen Reactions   Donepezil Other (See Comments)    Feb 2021 -Continuous headache while on donepezil that resolved when stopped taking it      Current Outpatient Medications  Medication Sig Dispense Refill   acetaminophen (TYLENOL) 325 MG tablet Take 2 tablets (650 mg total) by mouth every 4 (four) hours as needed for headache or mild pain.     amLODipine (NORVASC) 5 MG tablet Take 5 mg by mouth at bedtime.     Calcium Carb-Cholecalciferol 600-800 MG-UNIT TABS Take 1 tablet by mouth every morning.     Cyanocobalamin (VITAMIN B 12 PO) Take 1,000 mcg by mouth every evening.      famotidine (PEPCID) 40 MG tablet 1 tablet at bedtime.     lamoTRIgine (LAMICTAL) 100 MG tablet Take 1 tablet (100 mg total) by mouth at bedtime. (Patient taking differently: Take 50 mg by mouth at bedtime.) 90 tablet 4   memantine (NAMENDA) 10 MG tablet Take 1 tablet (10 mg total) by mouth 2 (two) times daily. 180 tablet 4   metoprolol succinate (TOPROL-XL) 25 MG 24 hr tablet Take 25 mg by mouth daily.     Probiotic Product (HEALTHY COLON PO) Take 1 capsule by mouth daily.      VITAMIN D PO Take 1 tablet by mouth daily.     omeprazole (PRILOSEC) 40 MG capsule Take 1 capsule (40 mg total) by mouth daily before breakfast. **PLEASE SCHEDULE FOLLOW UP FOR FURTHER REFILLS** (Patient not taking: Reported on 08/05/2021) 90 capsule 0   No current facility-administered medications for this visit.     Past Medical History:  Diagnosis Date   Absence of appendix, congenital    Arthritis    Complete heart block ([place]) 02/02/2020   GERD (gastroesophageal reflux disease)    Hiatal hernia    History of colon polyps    History of gastric polyp     Hypercholesterolemia    Hypertension    PER PT NO MEDICINE   Irritable bowel syndrome    resolved last few years   Mobitz type 2 second degree heart block    intermittant,  cardiac work-up done;  Cardiac cath 10-02-2018,  echo 09-24-2018, event monitor 09-24-2018 (all results in epic)   Neck pain    right sided neck pain   OSA (obstructive sleep apnea)    does not use c-pap did not like   Osteoporosis 06/2017   T score -2.8   PSVT (paroxysmal supraventricular tachycardia) ([place])    short run on event monitor 09-24-2018   Wears glasses     ROS:   All systems reviewed and negative except as noted in the HPI.   Past Surgical History:  Procedure Laterality Date   CATARACT EXTRACTION W/ INTRAOCULAR LENS  IMPLANT, BILATERAL     COLONOSCOPY     cortisone shots in back     ESOPHAGOGASTRODUODENOSCOPY     ESOPHAGOGASTRODUODENOSCOPY N/A 08/14/2013   Procedure: ESOPHAGOGASTRODUODENOSCOPY (EGD);  Surgeon: Jeryl Columbia, MD;  Location: [facility];  Service: Endoscopy;  Laterality: N/A;   ESOPHAGOGASTRODUODENOSCOPY (EGD) WITH PROPOFOL N/A 09/29/2015   Procedure: ESOPHAGOGASTRODUODENOSCOPY (EGD) WITH PROPOFOL;  Surgeon: Clarene Essex, MD;  Location: [facility];  Service: Endoscopy;  Laterality: N/A;  FOOT SURGERY Right 06-30-2005   dr duda   osteotomy first and second toe's   HIATAL HERNIA REPAIR N/A 10/29/2018   Procedure: LAPAROSCOPIC REPAIR OF HIATAL HERNIA WITH NISSEN FUNDOPLICATION, WITH MESH;  Surgeon: Excell Seltzer, MD;  Location: [facility];  Service: General;  Laterality: N/A;   HOT HEMOSTASIS N/A 08/14/2013   Procedure: HOT HEMOSTASIS (ARGON PLASMA COAGULATION/BICAP);  Surgeon: Jeryl Columbia, MD;  Location: [facility];  Service: Endoscopy;  Laterality: N/A;   HOT HEMOSTASIS N/A 09/29/2015   Procedure: HOT HEMOSTASIS (ARGON PLASMA COAGULATION/BICAP);  Surgeon: Clarene Essex, MD;  Location: [facility];  Service: Endoscopy;  Laterality: N/A;   PACEMAKER IMPLANT N/A 02/03/2020   Procedure:  PACEMAKER IMPLANT;  Surgeon: Evans Lance, MD;  Location: [facility];  Service: Cardiovascular;  Laterality: N/A;   RIGHT/LEFT HEART CATH AND CORONARY ANGIOGRAPHY N/A 10/02/2018   Procedure: RIGHT/LEFT HEART CATH AND CORONARY ANGIOGRAPHY;  Surgeon: Troy Sine, MD;  Location: [facility];  Service: Cardiovascular;  Laterality: N/A;   TUBAL LIGATION  yrs ago     Family History  Problem Relation Age of Onset   Heart disease Father        MI   Kidney failure Mother        pregnant with twins   Heart disease Paternal Uncle        MI   Heart disease Paternal Uncle        MI   Heart disease Paternal Uncle        MI   Heart disease Paternal Uncle        MI   Colon cancer Neg Hx    Liver cancer Neg Hx    Esophageal cancer Neg Hx    Rectal cancer Neg Hx    Stomach cancer Neg Hx          BP 122/68   Pulse 64   Ht 5' 3.5" (1.613 m)   Wt 138 lb (62.6 kg)   SpO2 98%   BMI 24.06 kg/m   Physical Exam:  Well appearing NAD HEENT: Unremarkable Neck:  No JVD, no thyromegally Lymphatics:  No adenopathy Back:  No CVA tenderness Lungs:  Clear with no wheezes HEART:  Regular rate rhythm, no murmurs, no rubs, no clicks Abd:  soft, positive bowel sounds, no organomegally, no rebound, no guarding Ext:  2 plus pulses, no edema, no cyanosis, no clubbing Skin:  No rashes no nodules Neuro:  CN II through XII intact, motor grossly intact  EKG - nsr with atrial pacing  DEVICE  Normal device function.  See PaceArt for details.   Assess/Plan:  1. CHB - she is asymptomatic, s/p PPM insertion. She has no escape today. 2. HTN - her bp is elevated. We discussed medication adjustment and she will increase her dose of losartan to 50 mg daily. She will return for a bmp and bp check. 3. PPM - her [employer] Jude DDD PM is working normally.  4. Dementia - she is on namenda and she improved her neuro function after PPM insertion.   [name] Emile Ringgenberg,MD

## 2021-08-05 NOTE — Patient Instructions (Addendum)
Medication Instructions:  Your physician recommends that you continue on your current medications as directed. Please refer to the Current Medication list given to you today.  Labwork: None ordered.  Testing/Procedures: None ordered.  Follow-Up: Your physician wants you to follow-up in: one year with one of the following Advanced Practice Providers on your designated Care Team:   Tommye Standard, PA-C   Remote monitoring is used to monitor your Pacemaker from home. This monitoring reduces the number of office visits required to check your device to one time per year. It allows [place] to keep an eye on the functioning of your device to ensure it is working properly. You are scheduled for a device check from home on 11/01/2021. You [place] send your transmission at any time that day. If you have a wireless device, the transmission will be sent automatically. After your physician reviews your transmission, you will receive a postcard with your next transmission date.  Any Other Special Instructions Will Be Listed Below (If Applicable).  If you need a refill on your cardiac medications before your next appointment, please call your pharmacy.

## 2021-08-10 LAB — CUP PACEART INCLINIC DEVICE CHECK
Battery Remaining Longevity: 74 mo
Battery Voltage: 3.01 V
Brady Statistic RA Percent Paced: 13 %
Brady Statistic RV Percent Paced: 99.66 %
Date Time Interrogation Session: 20221110125900
Implantable Lead Implant Date: 20210510
Implantable Lead Implant Date: 20210510
Implantable Lead Location: 753859
Implantable Lead Location: 753860
Implantable Pulse Generator Implant Date: 20210510
Lead Channel Impedance Value: 512.5 Ohm
Lead Channel Impedance Value: 525 Ohm
Lead Channel Pacing Threshold Amplitude: 0.75 V
Lead Channel Pacing Threshold Amplitude: 0.75 V
Lead Channel Pacing Threshold Amplitude: 0.75 V
Lead Channel Pacing Threshold Amplitude: 0.75 V
Lead Channel Pacing Threshold Pulse Width: 0.5 ms
Lead Channel Pacing Threshold Pulse Width: 0.5 ms
Lead Channel Pacing Threshold Pulse Width: 1 ms
Lead Channel Pacing Threshold Pulse Width: 1 ms
Lead Channel Sensing Intrinsic Amplitude: 4.7 mV
Lead Channel Setting Pacing Amplitude: 2 V
Lead Channel Setting Pacing Amplitude: 2.5 V
Lead Channel Setting Pacing Pulse Width: 1 ms
Lead Channel Setting Sensing Sensitivity: 4 mV
Pulse Gen Model: 2272
Pulse Gen Serial Number: 3829878

## 2021-08-10 NOTE — Progress Notes (Signed)
Remote pacemaker transmission.   

## 2021-09-09 DIAGNOSIS — K219 Gastro-esophageal reflux disease without esophagitis: Secondary | ICD-10-CM | POA: Diagnosis not present

## 2021-09-09 DIAGNOSIS — N1832 Chronic kidney disease, stage 3b: Secondary | ICD-10-CM | POA: Diagnosis not present

## 2021-09-09 DIAGNOSIS — D649 Anemia, unspecified: Secondary | ICD-10-CM | POA: Diagnosis not present

## 2021-09-09 DIAGNOSIS — Z79899 Other long term (current) drug therapy: Secondary | ICD-10-CM | POA: Diagnosis not present

## 2021-09-09 DIAGNOSIS — Z0000 Encounter for general adult medical examination without abnormal findings: Secondary | ICD-10-CM | POA: Diagnosis not present

## 2021-09-09 DIAGNOSIS — I700 Atherosclerosis of aorta: Secondary | ICD-10-CM | POA: Diagnosis not present

## 2021-09-09 DIAGNOSIS — I129 Hypertensive chronic kidney disease with stage 1 through stage 4 chronic kidney disease, or unspecified chronic kidney disease: Secondary | ICD-10-CM | POA: Diagnosis not present

## 2021-09-09 DIAGNOSIS — Z950 Presence of cardiac pacemaker: Secondary | ICD-10-CM | POA: Diagnosis not present

## 2021-09-09 DIAGNOSIS — Z1389 Encounter for screening for other disorder: Secondary | ICD-10-CM | POA: Diagnosis not present

## 2021-09-09 DIAGNOSIS — I442 Atrioventricular block, complete: Secondary | ICD-10-CM | POA: Diagnosis not present

## 2021-09-09 DIAGNOSIS — F0390 Unspecified dementia without behavioral disturbance: Secondary | ICD-10-CM | POA: Diagnosis not present

## 2021-09-10 DIAGNOSIS — I129 Hypertensive chronic kidney disease with stage 1 through stage 4 chronic kidney disease, or unspecified chronic kidney disease: Secondary | ICD-10-CM | POA: Diagnosis not present

## 2021-10-26 DIAGNOSIS — R350 Frequency of micturition: Secondary | ICD-10-CM | POA: Diagnosis not present

## 2021-10-27 DIAGNOSIS — Z20828 Contact with and (suspected) exposure to other viral communicable diseases: Secondary | ICD-10-CM | POA: Diagnosis not present

## 2021-11-01 ENCOUNTER — Ambulatory Visit (INDEPENDENT_AMBULATORY_CARE_PROVIDER_SITE_OTHER): Payer: Medicare Other

## 2021-11-01 DIAGNOSIS — I442 Atrioventricular block, complete: Secondary | ICD-10-CM | POA: Diagnosis not present

## 2021-11-01 LAB — CUP PACEART REMOTE DEVICE CHECK
Battery Remaining Longevity: 74 mo
Battery Remaining Percentage: 79 %
Battery Voltage: 3.01 V
Brady Statistic AP VP Percent: 13 %
Brady Statistic AP VS Percent: 1 %
Brady Statistic AS VP Percent: 87 %
Brady Statistic AS VS Percent: 1 %
Brady Statistic RA Percent Paced: 13 %
Brady Statistic RV Percent Paced: 99 %
Date Time Interrogation Session: 20230206020012
Implantable Lead Implant Date: 20210510
Implantable Lead Implant Date: 20210510
Implantable Lead Location: 753859
Implantable Lead Location: 753860
Implantable Pulse Generator Implant Date: 20210510
Lead Channel Impedance Value: 490 Ohm
Lead Channel Impedance Value: 590 Ohm
Lead Channel Pacing Threshold Amplitude: 0.75 V
Lead Channel Pacing Threshold Amplitude: 0.75 V
Lead Channel Pacing Threshold Pulse Width: 0.5 ms
Lead Channel Pacing Threshold Pulse Width: 1 ms
Lead Channel Sensing Intrinsic Amplitude: 12 mV
Lead Channel Sensing Intrinsic Amplitude: 2.9 mV
Lead Channel Setting Pacing Amplitude: 2 V
Lead Channel Setting Pacing Amplitude: 2.5 V
Lead Channel Setting Pacing Pulse Width: 1 ms
Lead Channel Setting Sensing Sensitivity: 4 mV
Pulse Gen Model: 2272
Pulse Gen Serial Number: 3829878

## 2021-11-04 NOTE — Progress Notes (Signed)
Remote pacemaker transmission.   

## 2021-11-18 DIAGNOSIS — Z20822 Contact with and (suspected) exposure to covid-19: Secondary | ICD-10-CM | POA: Diagnosis not present

## 2021-12-01 DIAGNOSIS — Z20822 Contact with and (suspected) exposure to covid-19: Secondary | ICD-10-CM | POA: Diagnosis not present

## 2021-12-17 DIAGNOSIS — Z20822 Contact with and (suspected) exposure to covid-19: Secondary | ICD-10-CM | POA: Diagnosis not present

## 2021-12-23 DIAGNOSIS — Z20822 Contact with and (suspected) exposure to covid-19: Secondary | ICD-10-CM | POA: Diagnosis not present

## 2021-12-23 DIAGNOSIS — Z1152 Encounter for screening for COVID-19: Secondary | ICD-10-CM | POA: Diagnosis not present

## 2022-01-01 DIAGNOSIS — Z20822 Contact with and (suspected) exposure to covid-19: Secondary | ICD-10-CM | POA: Diagnosis not present

## 2022-01-06 DIAGNOSIS — Z20822 Contact with and (suspected) exposure to covid-19: Secondary | ICD-10-CM | POA: Diagnosis not present

## 2022-01-20 DIAGNOSIS — G4452 New daily persistent headache (NDPH): Secondary | ICD-10-CM | POA: Diagnosis not present

## 2022-01-20 DIAGNOSIS — I129 Hypertensive chronic kidney disease with stage 1 through stage 4 chronic kidney disease, or unspecified chronic kidney disease: Secondary | ICD-10-CM | POA: Diagnosis not present

## 2022-01-24 DIAGNOSIS — Z20822 Contact with and (suspected) exposure to covid-19: Secondary | ICD-10-CM | POA: Diagnosis not present

## 2022-01-31 ENCOUNTER — Ambulatory Visit (INDEPENDENT_AMBULATORY_CARE_PROVIDER_SITE_OTHER): Payer: Medicare Other

## 2022-01-31 DIAGNOSIS — I442 Atrioventricular block, complete: Secondary | ICD-10-CM

## 2022-02-01 DIAGNOSIS — Z20822 Contact with and (suspected) exposure to covid-19: Secondary | ICD-10-CM | POA: Diagnosis not present

## 2022-02-01 LAB — CUP PACEART REMOTE DEVICE CHECK
Battery Remaining Longevity: 70 mo
Battery Remaining Percentage: 76 %
Battery Voltage: 3.01 V
Brady Statistic AP VP Percent: 12 %
Brady Statistic AP VS Percent: 1 %
Brady Statistic AS VP Percent: 88 %
Brady Statistic AS VS Percent: 1 %
Brady Statistic RA Percent Paced: 11 %
Brady Statistic RV Percent Paced: 99 %
Date Time Interrogation Session: 20230508020016
Implantable Lead Implant Date: 20210510
Implantable Lead Implant Date: 20210510
Implantable Lead Location: 753859
Implantable Lead Location: 753860
Implantable Pulse Generator Implant Date: 20210510
Lead Channel Impedance Value: 450 Ohm
Lead Channel Impedance Value: 540 Ohm
Lead Channel Pacing Threshold Amplitude: 0.75 V
Lead Channel Pacing Threshold Amplitude: 0.75 V
Lead Channel Pacing Threshold Pulse Width: 0.5 ms
Lead Channel Pacing Threshold Pulse Width: 1 ms
Lead Channel Sensing Intrinsic Amplitude: 12 mV
Lead Channel Sensing Intrinsic Amplitude: 2.6 mV
Lead Channel Setting Pacing Amplitude: 2 V
Lead Channel Setting Pacing Amplitude: 2.5 V
Lead Channel Setting Pacing Pulse Width: 1 ms
Lead Channel Setting Sensing Sensitivity: 4 mV
Pulse Gen Model: 2272
Pulse Gen Serial Number: 3829878

## 2022-02-14 DIAGNOSIS — R609 Edema, unspecified: Secondary | ICD-10-CM | POA: Diagnosis not present

## 2022-02-16 DIAGNOSIS — Z20828 Contact with and (suspected) exposure to other viral communicable diseases: Secondary | ICD-10-CM | POA: Diagnosis not present

## 2022-02-18 NOTE — Progress Notes (Signed)
Remote pacemaker transmission.   

## 2022-02-22 DIAGNOSIS — Z20828 Contact with and (suspected) exposure to other viral communicable diseases: Secondary | ICD-10-CM | POA: Diagnosis not present

## 2022-02-23 DIAGNOSIS — I129 Hypertensive chronic kidney disease with stage 1 through stage 4 chronic kidney disease, or unspecified chronic kidney disease: Secondary | ICD-10-CM | POA: Diagnosis not present

## 2022-02-23 DIAGNOSIS — R609 Edema, unspecified: Secondary | ICD-10-CM | POA: Diagnosis not present

## 2022-02-23 DIAGNOSIS — N1832 Chronic kidney disease, stage 3b: Secondary | ICD-10-CM | POA: Diagnosis not present

## 2022-03-17 DIAGNOSIS — H35033 Hypertensive retinopathy, bilateral: Secondary | ICD-10-CM | POA: Diagnosis not present

## 2022-05-02 ENCOUNTER — Ambulatory Visit (INDEPENDENT_AMBULATORY_CARE_PROVIDER_SITE_OTHER): Payer: Medicare Other

## 2022-05-02 DIAGNOSIS — I442 Atrioventricular block, complete: Secondary | ICD-10-CM | POA: Diagnosis not present

## 2022-05-03 LAB — CUP PACEART REMOTE DEVICE CHECK
Battery Remaining Longevity: 66 mo
Battery Remaining Percentage: 73 %
Battery Voltage: 3.01 V
Brady Statistic AP VP Percent: 14 %
Brady Statistic AP VS Percent: 1 %
Brady Statistic AS VP Percent: 86 %
Brady Statistic AS VS Percent: 1 %
Brady Statistic RA Percent Paced: 13 %
Brady Statistic RV Percent Paced: 99 %
Date Time Interrogation Session: 20230807020023
Implantable Lead Implant Date: 20210510
Implantable Lead Implant Date: 20210510
Implantable Lead Location: 753859
Implantable Lead Location: 753860
Implantable Pulse Generator Implant Date: 20210510
Lead Channel Impedance Value: 480 Ohm
Lead Channel Impedance Value: 510 Ohm
Lead Channel Pacing Threshold Amplitude: 0.75 V
Lead Channel Pacing Threshold Amplitude: 0.75 V
Lead Channel Pacing Threshold Pulse Width: 0.5 ms
Lead Channel Pacing Threshold Pulse Width: 1 ms
Lead Channel Sensing Intrinsic Amplitude: 12 mV
Lead Channel Sensing Intrinsic Amplitude: 3.5 mV
Lead Channel Setting Pacing Amplitude: 2 V
Lead Channel Setting Pacing Amplitude: 2.5 V
Lead Channel Setting Pacing Pulse Width: 1 ms
Lead Channel Setting Sensing Sensitivity: 4 mV
Pulse Gen Model: 2272
Pulse Gen Serial Number: 3829878

## 2022-05-17 DIAGNOSIS — I129 Hypertensive chronic kidney disease with stage 1 through stage 4 chronic kidney disease, or unspecified chronic kidney disease: Secondary | ICD-10-CM | POA: Diagnosis not present

## 2022-05-17 DIAGNOSIS — R609 Edema, unspecified: Secondary | ICD-10-CM | POA: Diagnosis not present

## 2022-06-02 NOTE — Progress Notes (Signed)
Remote pacemaker transmission.   

## 2022-07-18 DIAGNOSIS — Z23 Encounter for immunization: Secondary | ICD-10-CM | POA: Diagnosis not present

## 2022-08-01 ENCOUNTER — Ambulatory Visit (INDEPENDENT_AMBULATORY_CARE_PROVIDER_SITE_OTHER): Payer: Medicare Other

## 2022-08-01 DIAGNOSIS — I442 Atrioventricular block, complete: Secondary | ICD-10-CM

## 2022-08-01 DIAGNOSIS — Z950 Presence of cardiac pacemaker: Secondary | ICD-10-CM

## 2022-08-02 ENCOUNTER — Telehealth: Payer: Self-pay | Admitting: Neurology

## 2022-08-02 ENCOUNTER — Other Ambulatory Visit: Payer: Self-pay | Admitting: Neurology

## 2022-08-02 ENCOUNTER — Encounter: Payer: Self-pay | Admitting: Neurology

## 2022-08-02 LAB — CUP PACEART REMOTE DEVICE CHECK
Battery Remaining Longevity: 66 mo
Battery Remaining Percentage: 70 %
Battery Voltage: 2.99 V
Brady Statistic AP VP Percent: 19 %
Brady Statistic AP VS Percent: 1 %
Brady Statistic AS VP Percent: 80 %
Brady Statistic AS VS Percent: 1 %
Brady Statistic RA Percent Paced: 19 %
Brady Statistic RV Percent Paced: 99 %
Date Time Interrogation Session: 20231106021717
Implantable Lead Connection Status: 753985
Implantable Lead Connection Status: 753985
Implantable Lead Implant Date: 20210510
Implantable Lead Implant Date: 20210510
Implantable Lead Location: 753859
Implantable Lead Location: 753860
Implantable Pulse Generator Implant Date: 20210510
Lead Channel Impedance Value: 510 Ohm
Lead Channel Impedance Value: 590 Ohm
Lead Channel Pacing Threshold Amplitude: 0.75 V
Lead Channel Pacing Threshold Amplitude: 0.75 V
Lead Channel Pacing Threshold Pulse Width: 0.5 ms
Lead Channel Pacing Threshold Pulse Width: 1 ms
Lead Channel Sensing Intrinsic Amplitude: 12 mV
Lead Channel Sensing Intrinsic Amplitude: 3.1 mV
Lead Channel Setting Pacing Amplitude: 2 V
Lead Channel Setting Pacing Amplitude: 2.5 V
Lead Channel Setting Pacing Pulse Width: 1 ms
Lead Channel Setting Sensing Sensitivity: 4 mV
Pulse Gen Model: 2272
Pulse Gen Serial Number: 3829878

## 2022-08-02 MED ORDER — MEMANTINE HCL 10 MG PO TABS: 10.0000 mg | ORAL_TABLET | Freq: Two times a day (BID) | ORAL | 0 refills | Status: DC

## 2022-08-02 MED ORDER — LAMOTRIGINE 100 MG PO TABS: 50.0000 mg | ORAL_TABLET | Freq: Every day | ORAL | 0 refills | Status: AC

## 2022-08-02 NOTE — Telephone Encounter (Signed)
Meds ordered this encounter  Medications   memantine (NAMENDA) 10 MG tablet    Sig: Take 1 tablet (10 mg total) by mouth 2 (two) times daily.    Dispense:  180 tablet    Refill:  0   lamoTRIgine (LAMICTAL) 100 MG tablet    Sig: Take 0.5 tablets (50 mg total) by mouth at bedtime.    Dispense:  60 tablet    Refill:  0

## 2022-08-02 NOTE — Telephone Encounter (Signed)
Pt daughter is calling. Stated the pharmacy called her and stated the medication memantine (NAMENDA) 10 MG tablet is not appropriate.She is requesting a call back from the nurse

## 2022-08-02 NOTE — Addendum Note (Signed)
Addended by: Marcial Pacas on: 08/02/2022 05:16 PM   Modules accepted: Orders

## 2022-08-02 NOTE — Telephone Encounter (Signed)
Contacted daughter back, she stated we refused to fill medication as it was inappropriate and wanted to know why. I informed her it was likely due to pt not being seen in the last yr and has no FU scheduled. She stated that is not what she was told by MD. Dr Krista Blue told her it wasn't much else we could do but we would continue to fill medications. I informed her per notes, Will continue follow-up with her primary care physician, only return to clinic for new issues. Office policy is to be seen once a yr, at least. She again stated that's not what she was told and we should fill medications as she has been out for a day or two now. Are you willing to give 30 day and for me to advise she must FU with PCP for further refills or advise to contact PCP for refill. ?

## 2022-08-03 NOTE — Telephone Encounter (Signed)
Noted  

## 2022-08-11 ENCOUNTER — Ambulatory Visit: Payer: Medicare Other | Attending: Physician Assistant | Admitting: Internal Medicine

## 2022-08-11 ENCOUNTER — Encounter: Payer: Medicare Other | Admitting: Physician Assistant

## 2022-08-11 ENCOUNTER — Encounter: Payer: Self-pay | Admitting: Internal Medicine

## 2022-08-11 VITALS — BP 142/84 | HR 60 | Ht 62.0 in | Wt 127.0 lb

## 2022-08-11 DIAGNOSIS — I442 Atrioventricular block, complete: Secondary | ICD-10-CM

## 2022-08-11 DIAGNOSIS — I10 Essential (primary) hypertension: Secondary | ICD-10-CM | POA: Diagnosis not present

## 2022-08-11 DIAGNOSIS — Z950 Presence of cardiac pacemaker: Secondary | ICD-10-CM | POA: Diagnosis not present

## 2022-08-11 NOTE — Patient Instructions (Signed)
Medication Instructions:  Your physician recommends that you continue on your current medications as directed. Please refer to the Current Medication list given to you today.  *If you need a refill on your cardiac medications before your next appointment, please call your pharmacy*  Lab Work: None ordered.  If you have labs (blood work) drawn today and your tests are completely normal, you will receive your results only by: [place] (if you have MyChart) OR A paper copy in the mail If you have any lab test that is abnormal or we need to change your treatment, we will call you to review the results.  Testing/Procedures: None ordered.  Follow-Up: At [facility], you and your health needs are our priority.  As part of our continuing mission to provide you with exceptional heart care, we have created designated Provider Care Teams.  These Care Teams include your primary Cardiologist (physician) and Advanced Practice Providers (APPs -  Physician Assistants and Nurse Practitioners) who all work together to provide you with the care you need, when you need it.  We recommend signing up for the patient portal called "MyChart".  Sign up information is provided on this After Visit Summary.  MyChart is used to connect with patients for Virtual Visits (Telemedicine).  Patients are able to view lab/test results, encounter notes, upcoming appointments, etc.  Non-urgent messages can be sent to your provider as well.   To learn more about what you can do with MyChart, go to [email or web address].    Your next appointment:   1 year(s)  The format for your next appointment:   In Person  Provider:   [name], MD{or one of the following Advanced Practice Providers on your designated Care Team:   Tommye Standard, [place] Legrand Como "Jonni Sanger" Chalmers Cater, [place]  Remote monitoring is used to monitor your Pacemaker from home. This monitoring reduces the number of office visits required to check your device to  one time per year. It allows [place] to keep an eye on the functioning of your device to ensure it is working properly. You are scheduled for a device check from home on 10/31/22. You Thien send your transmission at any time that day. If you have a wireless device, the transmission will be sent automatically. After your physician reviews your transmission, you will receive a postcard with your next transmission date.  Important Information About Sugar

## 2022-08-11 NOTE — Progress Notes (Signed)
HPI [name] returns today for followup after undergoing PPM insertion over 3 years ago. She feels much better. SHe denies chest pain, sob, or peripheral edema. Her daughter notes that her thinking is good and bp is improved. No syncope. No edema.    Allergies  Allergen Reactions   Donepezil Other (See Comments)    Feb 2021 -Continuous headache while on donepezil that resolved when stopped taking it      Current Outpatient Medications  Medication Sig Dispense Refill   acetaminophen (TYLENOL) 325 MG tablet Take 2 tablets (650 mg total) by mouth every 4 (four) hours as needed for headache or mild pain.     amLODipine (NORVASC) 5 MG tablet Take 5 mg by mouth at bedtime.     Calcium Carb-Cholecalciferol 600-800 MG-UNIT TABS Take 1 tablet by mouth every morning.     Cyanocobalamin (VITAMIN B 12 PO) Take 1,000 mcg by mouth every evening.      famotidine (PEPCID) 40 MG tablet 1 tablet at bedtime.     lamoTRIgine (LAMICTAL) 100 MG tablet Take 0.5 tablets (50 mg total) by mouth at bedtime. 60 tablet 0   memantine (NAMENDA) 10 MG tablet Take 1 tablet (10 mg total) by mouth 2 (two) times daily. 180 tablet 0   metoprolol tartrate (LOPRESSOR) 25 MG tablet Take 37.5 mg by mouth daily.     Probiotic Product (HEALTHY COLON PO) Take 1 capsule by mouth daily.      VITAMIN D PO Take 1 tablet by mouth daily.     No current facility-administered medications for this visit.     Past Medical History:  Diagnosis Date   Absence of appendix, congenital    Arthritis    Complete heart block ([facility]) 02/02/2020   GERD (gastroesophageal reflux disease)    Hiatal hernia    History of colon polyps    History of gastric polyp    Hypercholesterolemia    Hypertension    PER PT NO MEDICINE   Irritable bowel syndrome    resolved last few years   Mobitz type 2 second degree heart block    intermittant,  cardiac work-up done;  Cardiac cath 10-02-2018,  echo 09-24-2018, event monitor 09-24-2018 (all results in  epic)   Neck pain    right sided neck pain   OSA (obstructive sleep apnea)    does not use c-pap did not like   Osteoporosis 06/2017   T score -2.8   PSVT (paroxysmal supraventricular tachycardia)    short run on event monitor 09-24-2018   Wears glasses     ROS:   All systems reviewed and negative except as noted in the HPI.   Past Surgical History:  Procedure Laterality Date   CATARACT EXTRACTION W/ INTRAOCULAR LENS  IMPLANT, BILATERAL     COLONOSCOPY     cortisone shots in back     ESOPHAGOGASTRODUODENOSCOPY     ESOPHAGOGASTRODUODENOSCOPY N/A 08/14/2013   Procedure: ESOPHAGOGASTRODUODENOSCOPY (EGD);  Surgeon: Jeryl Columbia, MD;  Location: [facility];  Service: Endoscopy;  Laterality: N/A;   ESOPHAGOGASTRODUODENOSCOPY (EGD) WITH PROPOFOL N/A 09/29/2015   Procedure: ESOPHAGOGASTRODUODENOSCOPY (EGD) WITH PROPOFOL;  Surgeon: Clarene Essex, MD;  Location: [facility];  Service: Endoscopy;  Laterality: N/A;   FOOT SURGERY Right 06-30-2005   dr duda   osteotomy first and second toe's   HIATAL HERNIA REPAIR N/A 10/29/2018   Procedure: LAPAROSCOPIC REPAIR OF HIATAL HERNIA WITH NISSEN FUNDOPLICATION, WITH MESH;  Surgeon: Excell Seltzer, MD;  Location: [facility];  Service: General;  Laterality: N/A;   HOT HEMOSTASIS N/A 08/14/2013   Procedure: HOT HEMOSTASIS (ARGON PLASMA COAGULATION/BICAP);  Surgeon: Jeryl Columbia, MD;  Location: [facility];  Service: Endoscopy;  Laterality: N/A;   HOT HEMOSTASIS N/A 09/29/2015   Procedure: HOT HEMOSTASIS (ARGON PLASMA COAGULATION/BICAP);  Surgeon: Clarene Essex, MD;  Location: [facility];  Service: Endoscopy;  Laterality: N/A;   PACEMAKER IMPLANT N/A 02/03/2020   Procedure: PACEMAKER IMPLANT;  Surgeon: Evans Lance, MD;  Location: [place];  Service: Cardiovascular;  Laterality: N/A;   RIGHT/LEFT HEART CATH AND CORONARY ANGIOGRAPHY N/A 10/02/2018   Procedure: RIGHT/LEFT HEART CATH AND CORONARY ANGIOGRAPHY;  Surgeon: Troy Sine, MD;  Location:  [place] [facility];  Service: Cardiovascular;  Laterality: N/A;   TUBAL LIGATION  yrs ago     Family History  Problem Relation Age of Onset   Heart disease Father        MI   Kidney failure Mother        pregnant with twins   Heart disease Paternal Uncle        MI   Heart disease Paternal Uncle        MI   Heart disease Paternal Uncle        MI   Heart disease Paternal Uncle        MI   Colon cancer Neg Hx    Liver cancer Neg Hx    Esophageal cancer Neg Hx    Rectal cancer Neg Hx    Stomach cancer Neg Hx          BP (!) 142/84   Pulse 60   Ht '5\' 2"'$  (1.575 m)   Wt 127 lb (57.6 kg)   SpO2 100%   BMI 23.23 kg/m   Physical Exam:  Well appearing NAD HEENT: Unremarkable Neck:  No JVD, no thyromegally Lymphatics:  No adenopathy Back:  No CVA tenderness Lungs:  Clear HEART:  Regular rate rhythm, no murmurs, no rubs, no clicks Abd:  soft, positive bowel sounds, no organomegally, no rebound, no guarding Ext:  2 plus pulses, no edema, no cyanosis, no clubbing Skin:  No rashes no nodules Neuro:  CN II through XII intact, motor grossly intact  EKG - nsr with AV  pacing  DEVICE  Normal device function.  See PaceArt for details.   Assess/Plan:  1. CHB - she is asymptomatic, s/p PPM insertion. She has no escape today. 2. HTN - her bp is minimally elevated. She will continue her current meds. 3. PPM - her [employer]. Jude DDD PM is working normally.  4. Dementia - she is on namenda and she improved her neuro function after PPM insertion.   [name] General Wearing,MD

## 2022-09-02 NOTE — Progress Notes (Signed)
Remote pacemaker transmission.   

## 2022-10-07 DIAGNOSIS — B974 Respiratory syncytial virus as the cause of diseases classified elsewhere: Secondary | ICD-10-CM | POA: Diagnosis not present

## 2022-10-07 DIAGNOSIS — J069 Acute upper respiratory infection, unspecified: Secondary | ICD-10-CM | POA: Diagnosis not present

## 2022-10-07 DIAGNOSIS — Z03818 Encounter for observation for suspected exposure to other biological agents ruled out: Secondary | ICD-10-CM | POA: Diagnosis not present

## 2022-10-07 DIAGNOSIS — R051 Acute cough: Secondary | ICD-10-CM | POA: Diagnosis not present

## 2022-10-07 DIAGNOSIS — R059 Cough, unspecified: Secondary | ICD-10-CM | POA: Diagnosis not present

## 2022-10-25 ENCOUNTER — Other Ambulatory Visit: Payer: Self-pay | Admitting: Neurology

## 2022-10-25 NOTE — Telephone Encounter (Signed)
Pt daughter is calling. Stated the pharmacy told her that medication memantine (NAMENDA) 10 MG tablet  was denied and she needed to call the office and make appointment for pt. The daughter stated that Dr. Krista Blue told her that pt didn't have to come back for an appointment. She is requesting a call back from nurse.

## 2022-10-26 MED ORDER — MEMANTINE HCL 10 MG PO TABS: 10.0000 mg | ORAL_TABLET | Freq: Two times a day (BID) | ORAL | 0 refills | Status: AC

## 2022-10-26 NOTE — Telephone Encounter (Signed)
Pt's daughter called again wanting the medication filled. Policies were once again explained to the daughter. She kept insisting that she was told she didn't have to bring her mother in and it was explained to her that if she did not want to bring her here she can have the PCP fill it.

## 2022-10-26 NOTE — Addendum Note (Signed)
Addended by: Verlin Grills on: 10/26/2022 01:58 PM   Modules accepted: Orders

## 2022-10-31 ENCOUNTER — Ambulatory Visit: Payer: Medicare Other

## 2022-10-31 DIAGNOSIS — I442 Atrioventricular block, complete: Secondary | ICD-10-CM

## 2022-11-01 LAB — CUP PACEART REMOTE DEVICE CHECK
Battery Remaining Longevity: 60 mo
Battery Remaining Percentage: 67 %
Battery Voltage: 3.01 V
Brady Statistic AP VP Percent: 42 %
Brady Statistic AP VS Percent: 1 %
Brady Statistic AS VP Percent: 58 %
Brady Statistic AS VS Percent: 1 %
Brady Statistic RA Percent Paced: 41 %
Brady Statistic RV Percent Paced: 99 %
Date Time Interrogation Session: 20240205085806
Implantable Lead Connection Status: 753985
Implantable Lead Connection Status: 753985
Implantable Lead Implant Date: 20210510
Implantable Lead Implant Date: 20210510
Implantable Lead Location: 753859
Implantable Lead Location: 753860
Implantable Pulse Generator Implant Date: 20210510
Lead Channel Impedance Value: 480 Ohm
Lead Channel Impedance Value: 540 Ohm
Lead Channel Pacing Threshold Amplitude: 0.5 V
Lead Channel Pacing Threshold Amplitude: 0.5 V
Lead Channel Pacing Threshold Pulse Width: 0.5 ms
Lead Channel Pacing Threshold Pulse Width: 1 ms
Lead Channel Sensing Intrinsic Amplitude: 12 mV
Lead Channel Sensing Intrinsic Amplitude: 2.1 mV
Lead Channel Setting Pacing Amplitude: 2 V
Lead Channel Setting Pacing Amplitude: 2.5 V
Lead Channel Setting Pacing Pulse Width: 1 ms
Lead Channel Setting Sensing Sensitivity: 4 mV
Pulse Gen Model: 2272
Pulse Gen Serial Number: 3829878

## 2022-11-24 ENCOUNTER — Other Ambulatory Visit: Payer: Self-pay | Admitting: Internal Medicine

## 2022-11-24 DIAGNOSIS — Z0000 Encounter for general adult medical examination without abnormal findings: Secondary | ICD-10-CM | POA: Diagnosis not present

## 2022-11-24 DIAGNOSIS — K219 Gastro-esophageal reflux disease without esophagitis: Secondary | ICD-10-CM | POA: Diagnosis not present

## 2022-11-24 DIAGNOSIS — I129 Hypertensive chronic kidney disease with stage 1 through stage 4 chronic kidney disease, or unspecified chronic kidney disease: Secondary | ICD-10-CM | POA: Diagnosis not present

## 2022-11-24 DIAGNOSIS — D508 Other iron deficiency anemias: Secondary | ICD-10-CM | POA: Diagnosis not present

## 2022-11-24 DIAGNOSIS — N1832 Chronic kidney disease, stage 3b: Secondary | ICD-10-CM | POA: Diagnosis not present

## 2022-11-24 DIAGNOSIS — Z79899 Other long term (current) drug therapy: Secondary | ICD-10-CM | POA: Diagnosis not present

## 2022-11-24 DIAGNOSIS — F32A Depression, unspecified: Secondary | ICD-10-CM | POA: Diagnosis not present

## 2022-11-24 DIAGNOSIS — M810 Age-related osteoporosis without current pathological fracture: Secondary | ICD-10-CM | POA: Diagnosis not present

## 2022-11-24 DIAGNOSIS — I442 Atrioventricular block, complete: Secondary | ICD-10-CM | POA: Diagnosis not present

## 2022-11-24 DIAGNOSIS — F0390 Unspecified dementia without behavioral disturbance: Secondary | ICD-10-CM | POA: Diagnosis not present

## 2022-11-24 DIAGNOSIS — I700 Atherosclerosis of aorta: Secondary | ICD-10-CM | POA: Diagnosis not present

## 2022-11-24 DIAGNOSIS — E7800 Pure hypercholesterolemia, unspecified: Secondary | ICD-10-CM | POA: Diagnosis not present

## 2022-12-15 NOTE — Progress Notes (Signed)
Remote pacemaker transmission.   

## 2023-01-19 ENCOUNTER — Other Ambulatory Visit: Payer: Self-pay | Admitting: Physician Assistant

## 2023-01-19 ENCOUNTER — Ambulatory Visit
Admission: RE | Admit: 2023-01-19 | Discharge: 2023-01-19 | Disposition: A | Discharge status: Home / Self Care | Payer: Medicare Other | Source: Ambulatory Visit | Attending: Physician Assistant | Admitting: Physician Assistant

## 2023-01-19 DIAGNOSIS — G44209 Tension-type headache, unspecified, not intractable: Secondary | ICD-10-CM

## 2023-01-19 DIAGNOSIS — R519 Headache, unspecified: Secondary | ICD-10-CM | POA: Diagnosis not present

## 2023-01-19 DIAGNOSIS — G4452 New daily persistent headache (NDPH): Secondary | ICD-10-CM | POA: Diagnosis not present

## 2023-01-30 ENCOUNTER — Ambulatory Visit (INDEPENDENT_AMBULATORY_CARE_PROVIDER_SITE_OTHER): Payer: Medicare Other

## 2023-01-30 DIAGNOSIS — I442 Atrioventricular block, complete: Secondary | ICD-10-CM | POA: Diagnosis not present

## 2023-01-30 LAB — CUP PACEART REMOTE DEVICE CHECK
Battery Remaining Longevity: 56 mo
Battery Remaining Percentage: 64 %
Battery Voltage: 2.99 V
Brady Statistic AP VP Percent: 46 %
Brady Statistic AP VS Percent: 1 %
Brady Statistic AS VP Percent: 54 %
Brady Statistic AS VS Percent: 1 %
Brady Statistic RA Percent Paced: 45 %
Brady Statistic RV Percent Paced: 99 %
Date Time Interrogation Session: 20240506044003
Implantable Lead Connection Status: 753985
Implantable Lead Connection Status: 753985
Implantable Lead Implant Date: 20210510
Implantable Lead Implant Date: 20210510
Implantable Lead Location: 753859
Implantable Lead Location: 753860
Implantable Pulse Generator Implant Date: 20210510
Lead Channel Impedance Value: 460 Ohm
Lead Channel Impedance Value: 510 Ohm
Lead Channel Pacing Threshold Amplitude: 0.5 V
Lead Channel Pacing Threshold Amplitude: 0.5 V
Lead Channel Pacing Threshold Pulse Width: 0.5 ms
Lead Channel Pacing Threshold Pulse Width: 1 ms
Lead Channel Sensing Intrinsic Amplitude: 1.8 mV
Lead Channel Sensing Intrinsic Amplitude: 12 mV
Lead Channel Setting Pacing Amplitude: 2 V
Lead Channel Setting Pacing Amplitude: 2.5 V
Lead Channel Setting Pacing Pulse Width: 1 ms
Lead Channel Setting Sensing Sensitivity: 4 mV
Pulse Gen Model: 2272
Pulse Gen Serial Number: 3829878

## 2023-02-21 DIAGNOSIS — I129 Hypertensive chronic kidney disease with stage 1 through stage 4 chronic kidney disease, or unspecified chronic kidney disease: Secondary | ICD-10-CM | POA: Diagnosis not present

## 2023-02-21 DIAGNOSIS — R519 Headache, unspecified: Secondary | ICD-10-CM | POA: Diagnosis not present

## 2023-02-21 DIAGNOSIS — R400 Somnolence: Secondary | ICD-10-CM | POA: Diagnosis not present

## 2023-02-28 NOTE — Progress Notes (Signed)
Remote pacemaker transmission.   

## 2023-05-01 ENCOUNTER — Ambulatory Visit: Payer: Medicare Other

## 2023-05-01 DIAGNOSIS — I442 Atrioventricular block, complete: Secondary | ICD-10-CM | POA: Diagnosis not present

## 2023-05-02 ENCOUNTER — Telehealth: Payer: Self-pay | Admitting: Internal Medicine

## 2023-05-02 NOTE — Telephone Encounter (Signed)
  1. Has your device fired? no  2. Is you device beeping? no  3. Are you experiencing draining or swelling at device site? no  4. Are you calling to see if we received your device transmission? yes  5. Have you passed out? No- daughter said she sent another transmission manually about 30 minutes or morem   Please route to Device Clinic Pool

## 2023-05-02 NOTE — Telephone Encounter (Signed)
Manual transmission NOT received. When pt returns call, please assist with transmission  Attempted to return call. No answer, LMTCB.

## 2023-05-02 NOTE — Telephone Encounter (Signed)
Pt's daughter returning nurses all regarding Manual Transmission.

## 2023-05-15 NOTE — Progress Notes (Signed)
Remote pacemaker transmission.   

## 2023-05-17 ENCOUNTER — Ambulatory Visit
Admission: RE | Admit: 2023-05-17 | Discharge: 2023-05-17 | Disposition: A | Discharge status: Home / Self Care | Payer: Medicare Other | Source: Ambulatory Visit | Attending: Internal Medicine | Admitting: Internal Medicine

## 2023-05-17 DIAGNOSIS — M810 Age-related osteoporosis without current pathological fracture: Secondary | ICD-10-CM | POA: Diagnosis not present

## 2023-05-17 DIAGNOSIS — N958 Other specified menopausal and perimenopausal disorders: Secondary | ICD-10-CM | POA: Diagnosis not present

## 2023-05-17 DIAGNOSIS — R519 Headache, unspecified: Secondary | ICD-10-CM | POA: Diagnosis not present

## 2023-05-17 DIAGNOSIS — E349 Endocrine disorder, unspecified: Secondary | ICD-10-CM | POA: Diagnosis not present

## 2023-05-17 DIAGNOSIS — I129 Hypertensive chronic kidney disease with stage 1 through stage 4 chronic kidney disease, or unspecified chronic kidney disease: Secondary | ICD-10-CM | POA: Diagnosis not present

## 2023-05-17 DIAGNOSIS — M8588 Other specified disorders of bone density and structure, other site: Secondary | ICD-10-CM | POA: Diagnosis not present

## 2023-07-31 ENCOUNTER — Ambulatory Visit (INDEPENDENT_AMBULATORY_CARE_PROVIDER_SITE_OTHER): Payer: Medicare Other

## 2023-07-31 DIAGNOSIS — I442 Atrioventricular block, complete: Secondary | ICD-10-CM

## 2023-08-01 DIAGNOSIS — Z23 Encounter for immunization: Secondary | ICD-10-CM | POA: Diagnosis not present

## 2023-08-02 LAB — CUP PACEART REMOTE DEVICE CHECK
Battery Remaining Longevity: 50 mo
Battery Remaining Percentage: 58 %
Battery Voltage: 2.99 V
Brady Statistic AP VP Percent: 57 %
Brady Statistic AP VS Percent: 1 %
Brady Statistic AS VP Percent: 43 %
Brady Statistic AS VS Percent: 1 %
Brady Statistic RA Percent Paced: 57 %
Brady Statistic RV Percent Paced: 99 %
Date Time Interrogation Session: 20241105110649
Implantable Lead Connection Status: 753985
Implantable Lead Connection Status: 753985
Implantable Lead Implant Date: 20210510
Implantable Lead Implant Date: 20210510
Implantable Lead Location: 753859
Implantable Lead Location: 753860
Implantable Pulse Generator Implant Date: 20210510
Lead Channel Impedance Value: 450 Ohm
Lead Channel Impedance Value: 540 Ohm
Lead Channel Pacing Threshold Amplitude: 0.5 V
Lead Channel Pacing Threshold Amplitude: 0.5 V
Lead Channel Pacing Threshold Pulse Width: 0.5 ms
Lead Channel Pacing Threshold Pulse Width: 1 ms
Lead Channel Sensing Intrinsic Amplitude: 12 mV
Lead Channel Sensing Intrinsic Amplitude: 2.3 mV
Lead Channel Setting Pacing Amplitude: 2 V
Lead Channel Setting Pacing Amplitude: 2.5 V
Lead Channel Setting Pacing Pulse Width: 1 ms
Lead Channel Setting Sensing Sensitivity: 4 mV
Pulse Gen Model: 2272
Pulse Gen Serial Number: 3829878

## 2023-08-22 NOTE — Progress Notes (Signed)
Remote pacemaker transmission.   

## 2023-08-29 ENCOUNTER — Ambulatory Visit: Payer: Medicare Other | Attending: Internal Medicine | Admitting: Internal Medicine

## 2023-08-29 VITALS — BP 106/64 | HR 60 | Ht 62.0 in | Wt 130.2 lb

## 2023-08-29 DIAGNOSIS — Z950 Presence of cardiac pacemaker: Secondary | ICD-10-CM | POA: Diagnosis not present

## 2023-08-29 DIAGNOSIS — I442 Atrioventricular block, complete: Secondary | ICD-10-CM | POA: Insufficient documentation

## 2023-08-29 LAB — CUP PACEART INCLINIC DEVICE CHECK
Battery Remaining Longevity: 58 mo
Battery Voltage: 2.99 V
Brady Statistic RA Percent Paced: 57 %
Brady Statistic RV Percent Paced: 99.69 %
Date Time Interrogation Session: 20241203164451
Implantable Lead Connection Status: 753985
Implantable Lead Connection Status: 753985
Implantable Lead Implant Date: 20210510
Implantable Lead Implant Date: 20210510
Implantable Lead Location: 753859
Implantable Lead Location: 753860
Implantable Pulse Generator Implant Date: 20210510
Lead Channel Impedance Value: 487.5 Ohm
Lead Channel Impedance Value: 550 Ohm
Lead Channel Pacing Threshold Amplitude: 0.5 V
Lead Channel Pacing Threshold Amplitude: 0.5 V
Lead Channel Pacing Threshold Amplitude: 0.75 V
Lead Channel Pacing Threshold Amplitude: 0.75 V
Lead Channel Pacing Threshold Pulse Width: 0.5 ms
Lead Channel Pacing Threshold Pulse Width: 0.5 ms
Lead Channel Pacing Threshold Pulse Width: 0.5 ms
Lead Channel Pacing Threshold Pulse Width: 0.5 ms
Lead Channel Sensing Intrinsic Amplitude: 12 mV
Lead Channel Sensing Intrinsic Amplitude: 3.4 mV
Lead Channel Setting Pacing Amplitude: 2 V
Lead Channel Setting Pacing Amplitude: 2.5 V
Lead Channel Setting Pacing Pulse Width: 0.5 ms
Lead Channel Setting Sensing Sensitivity: 4 mV
Pulse Gen Model: 2272
Pulse Gen Serial Number: 3829878

## 2023-08-29 NOTE — Patient Instructions (Signed)

## 2023-08-29 NOTE — Progress Notes (Signed)
HPI [name] returns today for followup after undergoing PPM insertion over 3 years ago. She feels much better. SHe denies chest pain, sob, or peripheral edema. Her daughter notes that her thinking is good and bp is improved. No syncope. No edema.   Allergies  Allergen Reactions   Donepezil Other (See Comments)    Feb 2021 -Continuous headache while on donepezil that resolved when stopped taking it      Current Outpatient Medications  Medication Sig Dispense Refill   acetaminophen (TYLENOL) 325 MG tablet Take 2 tablets (650 mg total) by mouth every 4 (four) hours as needed for headache or mild pain.     amLODipine (NORVASC) 5 MG tablet Take 5 mg by mouth at bedtime.     Calcium Carb-Cholecalciferol 600-800 MG-UNIT TABS Take 1 tablet by mouth every morning.     Cyanocobalamin (VITAMIN B 12 PO) Take 1,000 mcg by mouth every evening.      famotidine (PEPCID) 40 MG tablet 1 tablet at bedtime.     lamoTRIgine (LAMICTAL) 100 MG tablet Take 0.5 tablets (50 mg total) by mouth at bedtime. 60 tablet 0   memantine (NAMENDA) 10 MG tablet Take 1 tablet (10 mg total) by mouth 2 (two) times daily. 180 tablet 0   metoprolol tartrate (LOPRESSOR) 25 MG tablet Take 37.5 mg by mouth daily.     Probiotic Product (HEALTHY COLON PO) Take 1 capsule by mouth daily.      VITAMIN D PO Take 1 tablet by mouth daily.     No current facility-administered medications for this visit.     Past Medical History:  Diagnosis Date   Absence of appendix, congenital    Arthritis    Complete heart block ([facility]) 02/02/2020   GERD (gastroesophageal reflux disease)    Hiatal hernia    History of colon polyps    History of gastric polyp    Hypercholesterolemia    Hypertension    PER PT NO MEDICINE   Irritable bowel syndrome    resolved last few years   Mobitz type 2 second degree heart block    intermittant,  cardiac work-up done;  Cardiac cath 10-02-2018,  echo 09-24-2018, event monitor 09-24-2018 (all results in  epic)   Neck pain    right sided neck pain   OSA (obstructive sleep apnea)    does not use c-pap did not like   Osteoporosis 06/2017   T score -2.8   PSVT (paroxysmal supraventricular tachycardia)    short run on event monitor 09-24-2018   Wears glasses     ROS:   All systems reviewed and negative except as noted in the HPI.   Past Surgical History:  Procedure Laterality Date   CATARACT EXTRACTION W/ INTRAOCULAR LENS  IMPLANT, BILATERAL     COLONOSCOPY     cortisone shots in back     ESOPHAGOGASTRODUODENOSCOPY     ESOPHAGOGASTRODUODENOSCOPY N/A 08/14/2013   Procedure: ESOPHAGOGASTRODUODENOSCOPY (EGD);  Surgeon: Petra Kuba, MD;  Location: [facility];  Service: Endoscopy;  Laterality: N/A;   ESOPHAGOGASTRODUODENOSCOPY (EGD) WITH PROPOFOL N/A 09/29/2015   Procedure: ESOPHAGOGASTRODUODENOSCOPY (EGD) WITH PROPOFOL;  Surgeon: Vida Rigger, MD;  Location: [facility];  Service: Endoscopy;  Laterality: N/A;   FOOT SURGERY Right 06-30-2005   dr duda   osteotomy first and second toe's   HIATAL HERNIA REPAIR N/A 10/29/2018   Procedure: LAPAROSCOPIC REPAIR OF HIATAL HERNIA WITH NISSEN FUNDOPLICATION, WITH MESH;  Surgeon: Glenna Fellows, MD;  Location: [facility];  Service: General;  Laterality: N/A;   HOT HEMOSTASIS N/A 08/14/2013   Procedure: HOT HEMOSTASIS (ARGON PLASMA COAGULATION/BICAP);  Surgeon: Petra Kuba, MD;  Location: [facility];  Service: Endoscopy;  Laterality: N/A;   HOT HEMOSTASIS N/A 09/29/2015   Procedure: HOT HEMOSTASIS (ARGON PLASMA COAGULATION/BICAP);  Surgeon: Vida Rigger, MD;  Location: [facility];  Service: Endoscopy;  Laterality: N/A;   PACEMAKER IMPLANT N/A 02/03/2020   Procedure: PACEMAKER IMPLANT;  Surgeon: Marinus Maw, MD;  Location: [facility];  Service: Cardiovascular;  Laterality: N/A;   RIGHT/LEFT HEART CATH AND CORONARY ANGIOGRAPHY N/A 10/02/2018   Procedure: RIGHT/LEFT HEART CATH AND CORONARY ANGIOGRAPHY;  Surgeon: Lennette Bihari, MD;  Location:  [facility];  Service: Cardiovascular;  Laterality: N/A;   TUBAL LIGATION  yrs ago     Family History  Problem Relation Age of Onset   Heart disease Father        MI   Kidney failure Mother        pregnant with twins   Heart disease Paternal Uncle        MI   Heart disease Paternal Uncle        MI   Heart disease Paternal Uncle        MI   Heart disease Paternal Uncle        MI   Colon cancer Neg Hx    Liver cancer Neg Hx    Esophageal cancer Neg Hx    Rectal cancer Neg Hx    Stomach cancer Neg Hx          BP 106/64   Pulse 60   Ht 5\' 2"  (1.575 m)   Wt 130 lb 3.2 oz (59.1 kg)   SpO2 95%   BMI 23.81 kg/m   Physical Exam:  Well appearing NAD HEENT: Unremarkable Neck:  No JVD, no thyromegally Lymphatics:  No adenopathy Back:  No CVA tenderness Lungs:  Clear with no wheezes HEART:  Regular rate rhythm, no murmurs, no rubs, no clicks Abd:  soft, positive bowel sounds, no organomegally, no rebound, no guarding Ext:  2 plus pulses, no edema, no cyanosis, no clubbing Skin:  No rashes no nodules Neuro:  CN II through XII intact, motor grossly intact  EKG - nsr with AV pacing  DEVICE  Normal device function.  See PaceArt for details.   Assess/Plan:  CHB - she is asymptomatic, s/p PPM insertion. She has no escape today. 2. HTN - her bp is minimally elevated. She will continue her current meds. 3. PPM - her [employer]. Jude DDD PM is working normally.  4. Dementia - she is on namenda and she improved her neuro function after PPM insertion.   [name] [name],MD

## 2023-10-30 ENCOUNTER — Ambulatory Visit (INDEPENDENT_AMBULATORY_CARE_PROVIDER_SITE_OTHER): Payer: Medicare Other

## 2023-10-30 DIAGNOSIS — I442 Atrioventricular block, complete: Secondary | ICD-10-CM

## 2023-10-31 ENCOUNTER — Encounter: Payer: Self-pay | Admitting: Internal Medicine

## 2023-10-31 LAB — CUP PACEART REMOTE DEVICE CHECK
Battery Remaining Longevity: 56 mo
Battery Remaining Percentage: 55 %
Battery Voltage: 2.99 V
Brady Statistic AP VP Percent: 68 %
Brady Statistic AP VS Percent: 1 %
Brady Statistic AS VP Percent: 32 %
Brady Statistic AS VS Percent: 1 %
Brady Statistic RA Percent Paced: 67 %
Brady Statistic RV Percent Paced: 99 %
Date Time Interrogation Session: 20250203134715
Implantable Lead Connection Status: 753985
Implantable Lead Connection Status: 753985
Implantable Lead Implant Date: 20210510
Implantable Lead Implant Date: 20210510
Implantable Lead Location: 753859
Implantable Lead Location: 753860
Implantable Pulse Generator Implant Date: 20210510
Lead Channel Impedance Value: 460 Ohm
Lead Channel Impedance Value: 550 Ohm
Lead Channel Pacing Threshold Amplitude: 0.5 V
Lead Channel Pacing Threshold Amplitude: 0.75 V
Lead Channel Pacing Threshold Pulse Width: 0.5 ms
Lead Channel Pacing Threshold Pulse Width: 0.5 ms
Lead Channel Sensing Intrinsic Amplitude: 12 mV
Lead Channel Sensing Intrinsic Amplitude: 2.9 mV
Lead Channel Setting Pacing Amplitude: 2 V
Lead Channel Setting Pacing Amplitude: 2.5 V
Lead Channel Setting Pacing Pulse Width: 0.5 ms
Lead Channel Setting Sensing Sensitivity: 4 mV
Pulse Gen Model: 2272
Pulse Gen Serial Number: 3829878

## 2023-12-06 NOTE — Addendum Note (Signed)
 Addended by: Geralyn Flash D on: 12/06/2023 01:07 PM   Modules accepted: Orders

## 2023-12-06 NOTE — Progress Notes (Signed)
 Remote pacemaker transmission.

## 2023-12-27 DIAGNOSIS — I129 Hypertensive chronic kidney disease with stage 1 through stage 4 chronic kidney disease, or unspecified chronic kidney disease: Secondary | ICD-10-CM | POA: Diagnosis not present

## 2023-12-27 DIAGNOSIS — E7800 Pure hypercholesterolemia, unspecified: Secondary | ICD-10-CM | POA: Diagnosis not present

## 2023-12-27 DIAGNOSIS — Z0000 Encounter for general adult medical examination without abnormal findings: Secondary | ICD-10-CM | POA: Diagnosis not present

## 2023-12-27 DIAGNOSIS — K219 Gastro-esophageal reflux disease without esophagitis: Secondary | ICD-10-CM | POA: Diagnosis not present

## 2023-12-27 DIAGNOSIS — I442 Atrioventricular block, complete: Secondary | ICD-10-CM | POA: Diagnosis not present

## 2023-12-27 DIAGNOSIS — N1832 Chronic kidney disease, stage 3b: Secondary | ICD-10-CM | POA: Diagnosis not present

## 2023-12-27 DIAGNOSIS — D508 Other iron deficiency anemias: Secondary | ICD-10-CM | POA: Diagnosis not present

## 2023-12-27 DIAGNOSIS — F0390 Unspecified dementia without behavioral disturbance: Secondary | ICD-10-CM | POA: Diagnosis not present

## 2023-12-27 DIAGNOSIS — H6123 Impacted cerumen, bilateral: Secondary | ICD-10-CM | POA: Diagnosis not present

## 2023-12-27 DIAGNOSIS — R519 Headache, unspecified: Secondary | ICD-10-CM | POA: Diagnosis not present

## 2023-12-27 DIAGNOSIS — M810 Age-related osteoporosis without current pathological fracture: Secondary | ICD-10-CM | POA: Diagnosis not present

## 2023-12-27 DIAGNOSIS — Z950 Presence of cardiac pacemaker: Secondary | ICD-10-CM | POA: Diagnosis not present

## 2024-01-21 ENCOUNTER — Encounter (HOSPITAL_COMMUNITY): Payer: Self-pay | Admitting: Radiology

## 2024-01-21 ENCOUNTER — Emergency Department (HOSPITAL_COMMUNITY)

## 2024-01-21 ENCOUNTER — Inpatient Hospital Stay (HOSPITAL_COMMUNITY)
Admission: EM | Admit: 2024-01-21 | Discharge: 2024-01-25 | DRG: 522 | Disposition: A | Discharge status: Skilled Nursing Facility | Attending: Family Medicine | Admitting: Family Medicine

## 2024-01-21 ENCOUNTER — Other Ambulatory Visit: Payer: Self-pay

## 2024-01-21 DIAGNOSIS — I700 Atherosclerosis of aorta: Secondary | ICD-10-CM | POA: Diagnosis not present

## 2024-01-21 DIAGNOSIS — G4733 Obstructive sleep apnea (adult) (pediatric): Secondary | ICD-10-CM | POA: Diagnosis not present

## 2024-01-21 DIAGNOSIS — R1311 Dysphagia, oral phase: Secondary | ICD-10-CM | POA: Diagnosis not present

## 2024-01-21 DIAGNOSIS — Z043 Encounter for examination and observation following other accident: Secondary | ICD-10-CM | POA: Diagnosis not present

## 2024-01-21 DIAGNOSIS — M6259 Muscle wasting and atrophy, not elsewhere classified, multiple sites: Secondary | ICD-10-CM | POA: Diagnosis not present

## 2024-01-21 DIAGNOSIS — Z888 Allergy status to other drugs, medicaments and biological substances status: Secondary | ICD-10-CM

## 2024-01-21 DIAGNOSIS — F32A Depression, unspecified: Secondary | ICD-10-CM | POA: Diagnosis not present

## 2024-01-21 DIAGNOSIS — Y92014 Private driveway to single-family (private) house as the place of occurrence of the external cause: Secondary | ICD-10-CM

## 2024-01-21 DIAGNOSIS — W19XXXA Unspecified fall, initial encounter: Secondary | ICD-10-CM | POA: Diagnosis present

## 2024-01-21 DIAGNOSIS — I129 Hypertensive chronic kidney disease with stage 1 through stage 4 chronic kidney disease, or unspecified chronic kidney disease: Secondary | ICD-10-CM | POA: Diagnosis present

## 2024-01-21 DIAGNOSIS — S72009A Fracture of unspecified part of neck of unspecified femur, initial encounter for closed fracture: Secondary | ICD-10-CM | POA: Diagnosis present

## 2024-01-21 DIAGNOSIS — Z841 Family history of disorders of kidney and ureter: Secondary | ICD-10-CM

## 2024-01-21 DIAGNOSIS — R0689 Other abnormalities of breathing: Secondary | ICD-10-CM | POA: Diagnosis not present

## 2024-01-21 DIAGNOSIS — M810 Age-related osteoporosis without current pathological fracture: Secondary | ICD-10-CM | POA: Diagnosis not present

## 2024-01-21 DIAGNOSIS — Z8249 Family history of ischemic heart disease and other diseases of the circulatory system: Secondary | ICD-10-CM | POA: Diagnosis not present

## 2024-01-21 DIAGNOSIS — E7800 Pure hypercholesterolemia, unspecified: Secondary | ICD-10-CM | POA: Diagnosis not present

## 2024-01-21 DIAGNOSIS — G4730 Sleep apnea, unspecified: Secondary | ICD-10-CM | POA: Diagnosis not present

## 2024-01-21 DIAGNOSIS — I10 Essential (primary) hypertension: Secondary | ICD-10-CM | POA: Diagnosis not present

## 2024-01-21 DIAGNOSIS — Z950 Presence of cardiac pacemaker: Secondary | ICD-10-CM

## 2024-01-21 DIAGNOSIS — R488 Other symbolic dysfunctions: Secondary | ICD-10-CM | POA: Diagnosis not present

## 2024-01-21 DIAGNOSIS — Z743 Need for continuous supervision: Secondary | ICD-10-CM | POA: Diagnosis not present

## 2024-01-21 DIAGNOSIS — S72011D Unspecified intracapsular fracture of right femur, subsequent encounter for closed fracture with routine healing: Secondary | ICD-10-CM | POA: Diagnosis not present

## 2024-01-21 DIAGNOSIS — D62 Acute posthemorrhagic anemia: Secondary | ICD-10-CM | POA: Diagnosis not present

## 2024-01-21 DIAGNOSIS — M1611 Unilateral primary osteoarthritis, right hip: Secondary | ICD-10-CM | POA: Diagnosis not present

## 2024-01-21 DIAGNOSIS — M6281 Muscle weakness (generalized): Secondary | ICD-10-CM | POA: Diagnosis not present

## 2024-01-21 DIAGNOSIS — I959 Hypotension, unspecified: Secondary | ICD-10-CM | POA: Diagnosis not present

## 2024-01-21 DIAGNOSIS — Z4789 Encounter for other orthopedic aftercare: Secondary | ICD-10-CM | POA: Diagnosis not present

## 2024-01-21 DIAGNOSIS — K219 Gastro-esophageal reflux disease without esophagitis: Secondary | ICD-10-CM | POA: Diagnosis present

## 2024-01-21 DIAGNOSIS — N1831 Chronic kidney disease, stage 3a: Secondary | ICD-10-CM | POA: Diagnosis not present

## 2024-01-21 DIAGNOSIS — F0280 Dementia in other diseases classified elsewhere without behavioral disturbance: Secondary | ICD-10-CM | POA: Diagnosis not present

## 2024-01-21 DIAGNOSIS — M25551 Pain in right hip: Secondary | ICD-10-CM | POA: Diagnosis not present

## 2024-01-21 DIAGNOSIS — S72002A Fracture of unspecified part of neck of left femur, initial encounter for closed fracture: Secondary | ICD-10-CM

## 2024-01-21 DIAGNOSIS — Z01818 Encounter for other preprocedural examination: Secondary | ICD-10-CM | POA: Diagnosis not present

## 2024-01-21 DIAGNOSIS — N179 Acute kidney failure, unspecified: Secondary | ICD-10-CM | POA: Diagnosis not present

## 2024-01-21 DIAGNOSIS — F03C3 Unspecified dementia, severe, with mood disturbance: Secondary | ICD-10-CM | POA: Diagnosis not present

## 2024-01-21 DIAGNOSIS — S0990XA Unspecified injury of head, initial encounter: Secondary | ICD-10-CM | POA: Diagnosis not present

## 2024-01-21 DIAGNOSIS — R0902 Hypoxemia: Secondary | ICD-10-CM | POA: Diagnosis not present

## 2024-01-21 DIAGNOSIS — R262 Difficulty in walking, not elsewhere classified: Secondary | ICD-10-CM | POA: Diagnosis not present

## 2024-01-21 DIAGNOSIS — G301 Alzheimer's disease with late onset: Secondary | ICD-10-CM | POA: Diagnosis not present

## 2024-01-21 DIAGNOSIS — Z79899 Other long term (current) drug therapy: Secondary | ICD-10-CM | POA: Diagnosis not present

## 2024-01-21 DIAGNOSIS — R41841 Cognitive communication deficit: Secondary | ICD-10-CM | POA: Diagnosis not present

## 2024-01-21 DIAGNOSIS — E785 Hyperlipidemia, unspecified: Secondary | ICD-10-CM | POA: Diagnosis not present

## 2024-01-21 DIAGNOSIS — S72001A Fracture of unspecified part of neck of right femur, initial encounter for closed fracture: Secondary | ICD-10-CM | POA: Diagnosis present

## 2024-01-21 DIAGNOSIS — M25572 Pain in left ankle and joints of left foot: Secondary | ICD-10-CM | POA: Diagnosis not present

## 2024-01-21 DIAGNOSIS — Z66 Do not resuscitate: Secondary | ICD-10-CM | POA: Diagnosis not present

## 2024-01-21 DIAGNOSIS — S72011A Unspecified intracapsular fracture of right femur, initial encounter for closed fracture: Principal | ICD-10-CM | POA: Diagnosis present

## 2024-01-21 DIAGNOSIS — S79929A Unspecified injury of unspecified thigh, initial encounter: Secondary | ICD-10-CM | POA: Diagnosis not present

## 2024-01-21 DIAGNOSIS — T8484XD Pain due to internal orthopedic prosthetic devices, implants and grafts, subsequent encounter: Secondary | ICD-10-CM | POA: Diagnosis not present

## 2024-01-21 LAB — CBC WITH DIFFERENTIAL/PLATELET
Abs Immature Granulocytes: 0.07 10*3/uL (ref 0.00–0.07)
Basophils Absolute: 0.1 10*3/uL (ref 0.0–0.1)
Basophils Relative: 1 %
Eosinophils Absolute: 0.1 10*3/uL (ref 0.0–0.5)
Eosinophils Relative: 1 %
HCT: 39.8 % (ref 36.0–46.0)
Hemoglobin: 12.1 g/dL (ref 12.0–15.0)
Immature Granulocytes: 1 %
Lymphocytes Relative: 17 %
Lymphs Abs: 1.9 10*3/uL (ref 0.7–4.0)
MCH: 27.8 pg (ref 26.0–34.0)
MCHC: 30.4 g/dL (ref 30.0–36.0)
MCV: 91.3 fL (ref 80.0–100.0)
Monocytes Absolute: 0.6 10*3/uL (ref 0.1–1.0)
Monocytes Relative: 5 %
Neutro Abs: 8.3 10*3/uL — ABNORMAL HIGH (ref 1.7–7.7)
Neutrophils Relative %: 75 %
Platelets: 211 10*3/uL (ref 150–400)
RBC: 4.36 MIL/uL (ref 3.87–5.11)
RDW: 13 % (ref 11.5–15.5)
WBC: 10.9 10*3/uL — ABNORMAL HIGH (ref 4.0–10.5)
nRBC: 0 % (ref 0.0–0.2)

## 2024-01-21 LAB — CK: Total CK: 44 U/L (ref 38–234)

## 2024-01-21 LAB — TYPE AND SCREEN
ABO/RH(D): O POS
Antibody Screen: NEGATIVE

## 2024-01-21 LAB — COMPREHENSIVE METABOLIC PANEL WITH GFR
ALT: 10 U/L (ref 0–44)
AST: 18 U/L (ref 15–41)
Albumin: 3.7 g/dL (ref 3.5–5.0)
Alkaline Phosphatase: 79 U/L (ref 38–126)
Anion gap: 9 (ref 5–15)
BUN: 17 mg/dL (ref 8–23)
CO2: 28 mmol/L (ref 22–32)
Calcium: 9.1 mg/dL (ref 8.9–10.3)
Chloride: 102 mmol/L (ref 98–111)
Creatinine, Ser: 1.1 mg/dL — ABNORMAL HIGH (ref 0.44–1.00)
GFR, Estimated: 50 mL/min — ABNORMAL LOW (ref >60)
Glucose, Bld: 116 mg/dL — ABNORMAL HIGH (ref 70–99)
Potassium: 3.5 mmol/L (ref 3.5–5.1)
Sodium: 139 mmol/L (ref 135–145)
Total Bilirubin: 0.7 mg/dL (ref 0.0–1.2)
Total Protein: 6.9 g/dL (ref 6.5–8.1)

## 2024-01-21 MED ORDER — METOPROLOL TARTRATE 25 MG PO TABS
25.0000 mg | ORAL_TABLET | Freq: Every day | ORAL | Status: DC
Start: 2024-01-22 — End: 2024-01-25
  Administered 2024-01-22 – 2024-01-25 (×3): 25 mg via ORAL
  Filled 2024-01-21 (×4): qty 1

## 2024-01-21 MED ORDER — CITALOPRAM HYDROBROMIDE 20 MG PO TABS
10.0000 mg | ORAL_TABLET | Freq: Every day | ORAL | Status: DC
  Administered 2024-01-22 – 2024-01-24 (×3): 10 mg via ORAL
  Filled 2024-01-21 (×4): qty 1

## 2024-01-21 MED ORDER — ONDANSETRON HCL 4 MG/2ML IJ SOLN
4.0000 mg | Freq: Once | INTRAMUSCULAR | Status: AC
  Administered 2024-01-21: 4 mg via INTRAVENOUS
  Filled 2024-01-21: qty 2

## 2024-01-21 MED ORDER — ONDANSETRON HCL 4 MG PO TABS: 4.0000 mg | ORAL_TABLET | Freq: Four times a day (QID) | ORAL | Status: DC | PRN

## 2024-01-21 MED ORDER — FAMOTIDINE 20 MG PO TABS
40.0000 mg | ORAL_TABLET | Freq: Every morning | ORAL | Status: DC
  Administered 2024-01-22 – 2024-01-23 (×2): 40 mg via ORAL
  Filled 2024-01-21 (×2): qty 2

## 2024-01-21 MED ORDER — AMLODIPINE BESYLATE 5 MG PO TABS
5.0000 mg | ORAL_TABLET | Freq: Every day | ORAL | Status: DC
  Administered 2024-01-21 – 2024-01-24 (×3): 5 mg via ORAL
  Filled 2024-01-21 (×4): qty 1

## 2024-01-21 MED ORDER — ONDANSETRON HCL 4 MG/2ML IJ SOLN: 4.0000 mg | Freq: Four times a day (QID) | INTRAMUSCULAR | Status: DC | PRN

## 2024-01-21 MED ORDER — FENTANYL CITRATE PF 50 MCG/ML IJ SOSY
12.5000 ug | PREFILLED_SYRINGE | INTRAMUSCULAR | Status: DC | PRN
  Administered 2024-01-21: 12.5 ug via INTRAVENOUS
  Administered 2024-01-22: 50 ug via INTRAVENOUS
  Filled 2024-01-21 (×2): qty 1

## 2024-01-21 MED ORDER — MEMANTINE HCL 10 MG PO TABS
10.0000 mg | ORAL_TABLET | Freq: Two times a day (BID) | ORAL | Status: DC
  Administered 2024-01-21 – 2024-01-25 (×8): 10 mg via ORAL
  Filled 2024-01-21 (×2): qty 1
  Filled 2024-01-21: qty 2
  Filled 2024-01-21 (×2): qty 1
  Filled 2024-01-21: qty 2
  Filled 2024-01-21 (×2): qty 1

## 2024-01-21 MED ORDER — GABAPENTIN 100 MG PO CAPS
200.0000 mg | ORAL_CAPSULE | Freq: Every day | ORAL | Status: DC
  Administered 2024-01-21 – 2024-01-24 (×4): 200 mg via ORAL
  Filled 2024-01-21 (×4): qty 2

## 2024-01-21 MED ORDER — METOPROLOL TARTRATE 25 MG PO TABS: 21.5000 mg | ORAL_TABLET | Freq: Two times a day (BID) | ORAL | Status: DC

## 2024-01-21 MED ORDER — ACETAMINOPHEN 325 MG PO TABS
650.0000 mg | ORAL_TABLET | Freq: Four times a day (QID) | ORAL | Status: DC | PRN
  Administered 2024-01-21: 650 mg via ORAL
  Filled 2024-01-21: qty 2

## 2024-01-21 MED ORDER — ACETAMINOPHEN 650 MG RE SUPP: 650.0000 mg | Freq: Four times a day (QID) | RECTAL | Status: DC | PRN

## 2024-01-21 MED ORDER — METOPROLOL TARTRATE 12.5 MG HALF TABLET
12.5000 mg | ORAL_TABLET | Freq: Every day | ORAL | Status: DC
  Administered 2024-01-21 – 2024-01-24 (×3): 12.5 mg via ORAL
  Filled 2024-01-21 (×4): qty 1

## 2024-01-21 MED ORDER — MORPHINE SULFATE (PF) 4 MG/ML IV SOLN
4.0000 mg | Freq: Once | INTRAVENOUS | Status: AC
  Administered 2024-01-21: 4 mg via INTRAVENOUS
  Filled 2024-01-21: qty 1

## 2024-01-21 MED ORDER — LAMOTRIGINE 100 MG PO TABS
50.0000 mg | ORAL_TABLET | Freq: Every day | ORAL | Status: DC
  Administered 2024-01-22 – 2024-01-24 (×3): 50 mg via ORAL
  Filled 2024-01-21 (×3): qty 1
  Filled 2024-01-21: qty 2

## 2024-01-21 NOTE — ED Provider Notes (Signed)
 [place] EMERGENCY DEPARTMENT AT [facility] Provider Note   CSN: [id number] Arrival date & time: 01/21/24  1815     History HTN,Heart block Chief Complaint  Patient presents with   Erika [name] is a 83 y.o. female.  83 year old female with a past medical history of hypertension, complete heart block, not anticoagulated presents to the ED via EMS with a chief complaint of right hip pain.  Patient suffered an unwitnessed mechanical fall, she does not recall the incident, she has a history of short-term memory loss.  Patient currently lives with her daughter providing some of the history.  According to records she is not anticoagulated.  She is endorsing pain along the right hip especially with any type of movement or palpation.  She received 150 mics of fentanyl  with EMS, she did have some decrease in respiration therefore she was "coached by EMS to take some deep breaths ".  No hypoxia on arrival.  Denies any loss of consciousness, denies any headache, denies any other complaints.  The history is provided by the patient.  Fall Pertinent negatives include no chest pain, no abdominal pain and no shortness of breath.       Home Medications Prior to Admission medications   Medication Sig Start Date End Date Taking? Authorizing Provider  acetaminophen  (TYLENOL ) 325 MG tablet Take 2 tablets (650 mg total) by mouth every 4 (four) hours as needed for headache or mild pain. Patient taking differently: Take 650 mg by mouth daily as needed for headache or mild pain (pain score 1-3). 02/04/20  Yes Tylene Galla, PA-C  amLODipine (NORVASC) 5 MG tablet Take 5 mg by mouth at bedtime. 06/08/21  Yes [provider]  Calcium  Carb-Cholecalciferol 600-800 MG-UNIT TABS Take 1 tablet by mouth every morning.   Yes [provider]  citalopram (CELEXA) 10 MG tablet Take 10 mg by mouth at bedtime. 10/26/23  Yes [provider]  Cyanocobalamin  (VITAMIN B 12  PO) Take 1,000 mcg by mouth in the morning.   Yes [provider]  famotidine (PEPCID) 40 MG tablet Take 40 mg by mouth in the morning. 01/11/21  Yes [provider]  gabapentin  (NEURONTIN ) 100 MG capsule Take 200 mg by mouth at bedtime. 12/14/23  Yes [provider]  ibuprofen (ADVIL) 200 MG tablet Take 400 mg by mouth every 6 (six) hours as needed for mild pain (pain score 1-3) or moderate pain (pain score 4-6).   Yes [provider]  lamoTRIgine  (LAMICTAL ) 100 MG tablet Take 0.5 tablets (50 mg total) by mouth at bedtime. 08/02/22  Yes Phebe Brasil, MD  memantine  (NAMENDA ) 10 MG tablet Take 1 tablet (10 mg total) by mouth 2 (two) times daily. 10/26/22  Yes Phebe Brasil, MD  metoprolol tartrate (LOPRESSOR) 25 MG tablet Take 21.5-25 mg by mouth 2 (two) times daily. Take 1 tablet by mouth every morning and 0.5 tablet every night 07/08/22  Yes [provider]  Probiotic Product (PROBIOTIC PO) Take 1 capsule by mouth daily.   Yes [provider]  VITAMIN D  PO Take 2,000 Units by mouth daily.   Yes [provider]      Allergies    Donepezil and Xanax  [alprazolam ]    Review of Systems   Review of Systems  Constitutional:  Negative for chills and fever.  Respiratory:  Negative for shortness of breath.   Cardiovascular:  Negative for chest pain.  Gastrointestinal:  Negative for abdominal pain.  Musculoskeletal:  Positive for arthralgias and gait problem.  All other systems reviewed and are negative.   Physical Exam Updated Vital Signs BP (!) 145/65 (BP Location: Right Arm)   Pulse 60   Temp 98.1 F (36.7 C) (Oral)   Resp 18   Ht 5\' 2"  (1.575 m)   Wt 59 kg   SpO2 98%   BMI 23.78 kg/m  Physical Exam Vitals and nursing note reviewed.  Constitutional:      Appearance: Normal appearance.  HENT:     Head: Normocephalic.     Comments: No obvious deformity, no hematoma noted.    Mouth/Throat:     Mouth: Mucous membranes are moist.   Eyes:     Pupils: Pupils are equal, round, and reactive to light.     Comments: Pupils are equal and reactive.  Neck:     Comments: No pain with palpation along the cervical spine. Cardiovascular:     Rate and Rhythm: Normal rate.  Pulmonary:     Effort: Pulmonary effort is normal.     Breath sounds: No wheezing or rales.  Abdominal:     General: Abdomen is flat.     Palpations: Abdomen is soft.     Tenderness: There is no abdominal tenderness.  Musculoskeletal:        General: Deformity present.     Cervical back: Normal range of motion and neck supple.     Right hip: Deformity, tenderness and bony tenderness present. Decreased range of motion. Decreased strength.     Comments: Right leg is shortened and externally rotated. Sheet wrapped pelvis.   Skin:    General: Skin is warm and dry.  Neurological:     Mental Status: She is alert and oriented to person, place, and time.     ED Results / Procedures / Treatments   Labs (all labs ordered are listed, but only abnormal results are displayed) Labs Reviewed  CBC WITH DIFFERENTIAL/PLATELET - Abnormal; Notable for the following components:      Result Value   WBC 10.9 (*)    Neutro Abs 8.3 (*)    All other components within normal limits  COMPREHENSIVE METABOLIC PANEL WITH GFR - Abnormal; Notable for the following components:   Glucose, Bld 116 (*)    Creatinine, Ser 1.10 (*)    GFR, Estimated 50 (*)    All other components within normal limits  CK  TYPE AND SCREEN    EKG None  Radiology No results found.  Procedures Procedures    Medications Ordered in ED Medications  ondansetron  (ZOFRAN ) injection 4 mg (has no administration in time range)    ED Course/ Medical Decision Making/ A&P Clinical Course as of 01/21/24 2000  Sun Jan 21, 2024  1920 WBC(!): 10.9 [JS]    Clinical Course User Index [JS] Deklynn Charlet, PA-C                                 Medical Decision Making Amount and/or Complexity of Data  Reviewed Labs: ordered. Decision-making details documented in ED Course. Radiology: ordered.  Risk Prescription drug management.   This patient presents to the ED for concern of fall, this involves a number of treatment options, and is a complaint that carries with it a high risk of complications and morbidity.  The differential diagnosis includes dislocation versus fracture.   Co morbidities: Discussed in HPI   Brief History:  See HPI.  EMR reviewed including pt PMHx, past surgical history and past visits to ER.   See HPI for more details   Lab Tests:  I ordered and independently interpreted labs.  The pertinent results include:    I personally reviewed all laboratory work and imaging. Metabolic panel without any acute abnormality specifically kidney function within normal limits and no significant electrolyte abnormalities. CBC without leukocytosis or significant anemia.  Imaging Studies:  Xray of the right hip showed acute hip fracture.   Cardiac Monitoring:  EKG pending.   Medicines ordered:  I ordered medication including zofran   for nausea Reevaluation of the patient after these medicines showed that the patient stayed the same I have reviewed the patients home medicines and have made adjustments as needed  Consults:  I requested consultation with Orthopedics,  and discussed lab and imaging findings as well as pertinent plan - they recommend: admission to hospitalist   Reevaluation:  After the interventions noted above I re-evaluated patient and found that they have :stayed the same  Social Determinants of Health:  The patient's social determinants of health were a factor in the care of this patient  Problem List / ED Course:  Patient presents to the ED status post unwitnessed fall, she was found on the ground, reports she had been laying there for some time.  She does arrive to the ED in stable condition.  Received fentanyl  for pain control with EMS,  had some respiratory depression surfer she was coached to take some deep breaths, no hypoxia upon my evaluation.  On exam lungs are clear and she was quotation, there is no visible hematoma or deformities noted to the head, there is a right leg which appears to be shortened and externally rotated.  She is neurovascularly intact, is able to wiggle her toes pulses are present, has decreased strength.  Suspicion for hip fracture at this time. Blood work was obtained which showed a CBC with a slight leukocytosis, hemoglobin stable.  CMP with no electrolyte derangement.  She is not anticoagulated according to records.  Does have a prior history of heart block. Right hip x-ray reported by me does reveal femoral neck fracture.  Orthopedics was paged at this time. Spoke to Dr. Domingo Friend from orthopedics who recommended patient be admitted at [facility] for intervention tomorrow.  She will be n.p.o. after midnight and this has been addressed.  She remains hemodynamically stable.   Dispostion:  After consideration of the diagnostic results and the patients response to treatment, I feel that the patent would benefit from    Portions of this note were generated with Dragon dictation software. Dictation errors [place] occur despite best attempts at proofreading.   Final Clinical Impression(s) / ED Diagnoses Final diagnoses:  Fall, initial encounter  Closed fracture of right hip, initial encounter [facility])    Rx / DC Orders ED Discharge Orders     None         Wilmont Olund, PA-C 01/21/24 Dallas Due, MD 01/24/24 2038

## 2024-01-21 NOTE — Progress Notes (Addendum)
 Consult received from EDP.  Relevant imaging reviewed which shows right femoral neck fracture.  Please admit patient to hospitalist service at [facility] for surgical treatment tomorrow pending medical optimization.  NPO after midnight.  Please hold anticoagulation for surgery.  Full consult in the morning.    Sidonie Drape, MD [facility] 7:56 PM

## 2024-01-21 NOTE — H&P (Signed)
 History and Physical    Erika [name] ZOX:[id number] DOB: 28-Schweer-1942 DOA: 01/21/2024  PCP: Alvie Ax, MD (Inactive)   Chief Complaint:  fall  HPI: Erika [name] is a 83 y.o. female with medical history significant of complete heart block status post pacemaker 2002, GERD, hypertension, hyperlipidemia, dementia, severe ostial porosis who presents emergency department with a fall.  Patient had unwitnessed mechanical fall in the driveway.  Endorsing severe pain.  EMS was called and she was brought to the ER for further assessment.  On arrival she is afebrile hemodynamically stable.  Labs were obtained on presentation which demonstrated no acute changes.  X-ray of hip showed acute displaced right femoral neck fracture.  CT head showed no acute abnormalities.  Orthopedics was consulted and patient was admitted to [facility] for operative intervention in the morning.  Patient's family is at bedside confirmed DNR CODE STATUS.  Patient is able to walk around the house without shortness of breath or chest pain.  Last echocardiogram was obtained 4 years ago.  At that time she had a EF 65% with mild diastolic dysfunction.    review of Systems: Review of Systems  Constitutional: Negative.   HENT: Negative.    Eyes: Negative.   Respiratory: Negative.    Cardiovascular: Negative.   Gastrointestinal: Negative.   Genitourinary: Negative.   Musculoskeletal: Negative.   Skin: Negative.   Neurological: Negative.   Endo/Heme/Allergies: Negative.   Psychiatric/Behavioral: Negative.    All other systems reviewed and are negative.    As per HPI otherwise 10 point review of systems negative.   Allergies  Allergen Reactions   Donepezil Other (See Comments)    Feb 2021 -Continuous headache while on donepezil that resolved when stopped taking it    Xanax  [Alprazolam ] Other (See Comments)    Because not like herself and wild    Past Medical History:  Diagnosis Date   Absence of appendix, congenital     Arthritis    Complete heart block (HCC) 02/02/2020   GERD (gastroesophageal reflux disease)    Hiatal hernia    History of colon polyps    History of gastric polyp    Hypercholesterolemia    Hypertension    PER PT NO MEDICINE   Irritable bowel syndrome    resolved last few years   Mobitz type 2 second degree heart block    intermittant,  cardiac work-up done;  Cardiac cath 10-02-2018,  echo 09-24-2018, event monitor 09-24-2018 (all results in epic)   Neck pain    right sided neck pain   OSA (obstructive sleep apnea)    does not use c-pap did not like   Osteoporosis 06/2017   T score -2.8   PSVT (paroxysmal supraventricular tachycardia) (HCC)    short run on event monitor 09-24-2018   Wears glasses     Past Surgical History:  Procedure Laterality Date   CATARACT EXTRACTION W/ INTRAOCULAR LENS  IMPLANT, BILATERAL     COLONOSCOPY     cortisone shots in back     ESOPHAGOGASTRODUODENOSCOPY     ESOPHAGOGASTRODUODENOSCOPY N/A 08/14/2013   Procedure: ESOPHAGOGASTRODUODENOSCOPY (EGD);  Surgeon: Mathew Solomon, MD;  Location: [facility];  Service: Endoscopy;  Laterality: N/A;   ESOPHAGOGASTRODUODENOSCOPY (EGD) WITH PROPOFOL  N/A 09/29/2015   Procedure: ESOPHAGOGASTRODUODENOSCOPY (EGD) WITH PROPOFOL ;  Surgeon: Ozell Blunt, MD;  Location: [facility];  Service: Endoscopy;  Laterality: N/A;   FOOT SURGERY Right 06-30-2005   dr duda   osteotomy first and second toe's   HIATAL  HERNIA REPAIR N/A 10/29/2018   Procedure: LAPAROSCOPIC REPAIR OF HIATAL HERNIA WITH NISSEN FUNDOPLICATION, WITH MESH;  Surgeon: Ayesha Lente, MD;  Location: [facility];  Service: General;  Laterality: N/A;   HOT HEMOSTASIS N/A 08/14/2013   Procedure: HOT HEMOSTASIS (ARGON PLASMA COAGULATION/BICAP);  Surgeon: Mathew Solomon, MD;  Location: [facility];  Service: Endoscopy;  Laterality: N/A;   HOT HEMOSTASIS N/A 09/29/2015   Procedure: HOT HEMOSTASIS (ARGON PLASMA COAGULATION/BICAP);  Surgeon: Ozell Blunt, MD;  Location: [facility];  Service: Endoscopy;  Laterality: N/A;   PACEMAKER IMPLANT N/A 02/03/2020   Procedure: PACEMAKER IMPLANT;  Surgeon: Tammie Fall, MD;  Location: [facility];  Service: Cardiovascular;  Laterality: N/A;   RIGHT/LEFT HEART CATH AND CORONARY ANGIOGRAPHY N/A 10/02/2018   Procedure: RIGHT/LEFT HEART CATH AND CORONARY ANGIOGRAPHY;  Surgeon: Millicent Ally, MD;  Location: [facility];  Service: Cardiovascular;  Laterality: N/A;   TUBAL LIGATION  yrs ago     reports that she has never smoked. She has never used smokeless tobacco. She reports that she does not drink alcohol and does not use drugs.  Family History  Problem Relation Age of Onset   Heart disease Father        MI   Kidney failure Mother        pregnant with twins   Heart disease Paternal Uncle        MI   Heart disease Paternal Uncle        MI   Heart disease Paternal Uncle        MI   Heart disease Paternal Uncle        MI   Colon cancer Neg Hx    Liver cancer Neg Hx    Esophageal cancer Neg Hx    Rectal cancer Neg Hx    Stomach cancer Neg Hx     Prior to Admission medications   Medication Sig Start Date End Date Taking? Authorizing Provider  acetaminophen  (TYLENOL ) 325 MG tablet Take 2 tablets (650 mg total) by mouth every 4 (four) hours as needed for headache or mild pain. Patient taking differently: Take 650 mg by mouth daily as needed for headache or mild pain (pain score 1-3). 02/04/20  Yes Tylene Galla, PA-C  amLODipine (NORVASC) 5 MG tablet Take 5 mg by mouth at bedtime. 06/08/21  Yes [provider]  Calcium  Carb-Cholecalciferol 600-800 MG-UNIT TABS Take 1 tablet by mouth every morning.   Yes [provider]  citalopram (CELEXA) 10 MG tablet Take 10 mg by mouth at bedtime. 10/26/23  Yes [provider]  Cyanocobalamin  (VITAMIN B 12 PO) Take 1,000 mcg by mouth in the morning.   Yes [provider]  famotidine (PEPCID) 40 MG tablet Take 40 mg by  mouth in the morning. 01/11/21  Yes [provider]  gabapentin  (NEURONTIN ) 100 MG capsule Take 200 mg by mouth at bedtime. 12/14/23  Yes [provider]  ibuprofen (ADVIL) 200 MG tablet Take 400 mg by mouth every 6 (six) hours as needed for mild pain (pain score 1-3) or moderate pain (pain score 4-6).   Yes [provider]  lamoTRIgine  (LAMICTAL ) 100 MG tablet Take 0.5 tablets (50 mg total) by mouth at bedtime. 08/02/22  Yes Phebe Brasil, MD  memantine  (NAMENDA ) 10 MG tablet Take 1 tablet (10 mg total) by mouth 2 (two) times daily. 10/26/22  Yes Phebe Brasil, MD  metoprolol tartrate (LOPRESSOR) 25 MG tablet Take 21.5-25 mg by  mouth 2 (two) times daily. Take 1 tablet by mouth every morning and 0.5 tablet every night 07/08/22  Yes [provider]  Probiotic Product (PROBIOTIC PO) Take 1 capsule by mouth daily.   Yes [provider]  VITAMIN D  PO Take 2,000 Units by mouth daily.   Yes [provider]    Physical Exam: Vitals:   01/21/24 2015 01/21/24 2030 01/21/24 2100 01/21/24 2130  BP: 131/85 123/84 115/63 137/64  Pulse: (!) 58 (!) 59 60 62  Resp: 16 13 14 17   Temp:      TempSrc:      SpO2: 95% 100% 97% 95%  Weight:      Height:       Physical Exam Vitals reviewed.  Constitutional:      Appearance: She is normal weight.  HENT:     Nose: Nose normal.     Mouth/Throat:     Mouth: Mucous membranes are moist.     Pharynx: Oropharynx is clear.  Eyes:     Pupils: Pupils are equal, round, and reactive to light.  Cardiovascular:     Rate and Rhythm: Normal rate and regular rhythm.     Pulses: Normal pulses.     Heart sounds: Normal heart sounds.  Pulmonary:     Effort: Pulmonary effort is normal.     Breath sounds: Normal breath sounds.  Abdominal:     General: Abdomen is flat. Bowel sounds are normal.  Musculoskeletal:        General: Swelling and deformity present.     Cervical back: Normal range of motion.  Skin:    General: Skin  is warm.     Capillary Refill: Capillary refill takes less than 2 seconds.  Neurological:     General: No focal deficit present.     Mental Status: She is alert.  Psychiatric:        Mood and Affect: Mood normal.        Labs on Admission: I have personally reviewed the patients's labs and imaging studies.  Assessment/Plan Principal Problem:   Hip fracture (HCC)   # Femoral neck fracture - Patient mechanical fall in driveway - History of severe osteoporosis - Severe pain with any movement  Plan: Appreciate orthopedics recommendations regarding surgical intervention in the morning N.p.o. at midnight Hold any anticoagulation  # Severe cognitive impairment-continue memantine , lamotrigine   # GERD-continue Pepcid  # Hypertension-continue amlodipine, metoprolol  # Depression-continue Celexa  Admission status: Observation Med-Surg  Certification: The appropriate patient status for this patient is OBSERVATION. Observation status is judged to be reasonable and necessary in order to provide the required intensity of service to ensure the patient's safety. The patient's presenting symptoms, physical exam findings, and initial radiographic and laboratory data in the context of their medical condition is felt to place them at decreased risk for further clinical deterioration. Furthermore, it is anticipated that the patient will be medically stable for discharge from the hospital within 2 midnights of admission.     Myrl Askew MD [facility] If 7PM-7AM, please contact night-coverage [email or web address]  01/21/2024, 10:10 PM

## 2024-01-21 NOTE — ED Notes (Signed)
 Care link aware of need for transport.

## 2024-01-21 NOTE — ED Provider Notes (Signed)
 I provided a substantive portion of the care of this patient.  I personally made/approved the management plan for this patient and take responsibility for the patient management.   83 year old female with history dementia found after a fall.  Has evidence of right hip fracture.  Plan will be to consult with medics admit to the medicine service   Lind Repine, MD 01/21/24 [phone]

## 2024-01-21 NOTE — ED Triage Notes (Signed)
 Unwitnessed fall found outside by a neighbor. She is unsure of what happened or how she ended up on the ground outside. Shortening and rotation of the right leg. Complaining of pain in the right hip. Pt has had 150mcg Fent in total. First 100 then 50. Pt has dementia at baseline.  135/52 64 paced

## 2024-01-21 NOTE — ED Notes (Signed)
 Report given to Cornelius Dill RN at [facility].

## 2024-01-22 ENCOUNTER — Encounter (HOSPITAL_COMMUNITY)
Admission: EM | Disposition: A | Discharge status: Skilled Nursing Facility | Payer: Self-pay | Source: Home / Self Care | Attending: Family Medicine

## 2024-01-22 ENCOUNTER — Other Ambulatory Visit: Payer: Self-pay

## 2024-01-22 ENCOUNTER — Encounter (HOSPITAL_COMMUNITY): Payer: Self-pay | Admitting: Internal Medicine

## 2024-01-22 ENCOUNTER — Observation Stay (HOSPITAL_COMMUNITY)

## 2024-01-22 DIAGNOSIS — S72001A Fracture of unspecified part of neck of right femur, initial encounter for closed fracture: Secondary | ICD-10-CM | POA: Diagnosis present

## 2024-01-22 DIAGNOSIS — G301 Alzheimer's disease with late onset: Secondary | ICD-10-CM | POA: Diagnosis not present

## 2024-01-22 DIAGNOSIS — S72002A Fracture of unspecified part of neck of left femur, initial encounter for closed fracture: Secondary | ICD-10-CM | POA: Diagnosis not present

## 2024-01-22 DIAGNOSIS — F03C3 Unspecified dementia, severe, with mood disturbance: Secondary | ICD-10-CM | POA: Diagnosis present

## 2024-01-22 DIAGNOSIS — S72009A Fracture of unspecified part of neck of unspecified femur, initial encounter for closed fracture: Secondary | ICD-10-CM | POA: Diagnosis present

## 2024-01-22 DIAGNOSIS — Z8249 Family history of ischemic heart disease and other diseases of the circulatory system: Secondary | ICD-10-CM | POA: Diagnosis not present

## 2024-01-22 DIAGNOSIS — M1611 Unilateral primary osteoarthritis, right hip: Secondary | ICD-10-CM

## 2024-01-22 DIAGNOSIS — S72011A Unspecified intracapsular fracture of right femur, initial encounter for closed fracture: Secondary | ICD-10-CM | POA: Diagnosis present

## 2024-01-22 DIAGNOSIS — I10 Essential (primary) hypertension: Secondary | ICD-10-CM | POA: Diagnosis not present

## 2024-01-22 DIAGNOSIS — D62 Acute posthemorrhagic anemia: Secondary | ICD-10-CM | POA: Diagnosis not present

## 2024-01-22 DIAGNOSIS — N1831 Chronic kidney disease, stage 3a: Secondary | ICD-10-CM | POA: Diagnosis present

## 2024-01-22 DIAGNOSIS — N179 Acute kidney failure, unspecified: Secondary | ICD-10-CM | POA: Diagnosis not present

## 2024-01-22 DIAGNOSIS — R1311 Dysphagia, oral phase: Secondary | ICD-10-CM | POA: Diagnosis not present

## 2024-01-22 DIAGNOSIS — Z841 Family history of disorders of kidney and ureter: Secondary | ICD-10-CM | POA: Diagnosis not present

## 2024-01-22 DIAGNOSIS — E785 Hyperlipidemia, unspecified: Secondary | ICD-10-CM

## 2024-01-22 DIAGNOSIS — Z950 Presence of cardiac pacemaker: Secondary | ICD-10-CM | POA: Diagnosis not present

## 2024-01-22 DIAGNOSIS — Z743 Need for continuous supervision: Secondary | ICD-10-CM | POA: Diagnosis not present

## 2024-01-22 DIAGNOSIS — E7800 Pure hypercholesterolemia, unspecified: Secondary | ICD-10-CM | POA: Diagnosis present

## 2024-01-22 DIAGNOSIS — F0280 Dementia in other diseases classified elsewhere without behavioral disturbance: Secondary | ICD-10-CM | POA: Diagnosis not present

## 2024-01-22 DIAGNOSIS — Z888 Allergy status to other drugs, medicaments and biological substances status: Secondary | ICD-10-CM | POA: Diagnosis not present

## 2024-01-22 DIAGNOSIS — R41841 Cognitive communication deficit: Secondary | ICD-10-CM | POA: Diagnosis not present

## 2024-01-22 DIAGNOSIS — S79929A Unspecified injury of unspecified thigh, initial encounter: Secondary | ICD-10-CM | POA: Diagnosis not present

## 2024-01-22 DIAGNOSIS — T8484XD Pain due to internal orthopedic prosthetic devices, implants and grafts, subsequent encounter: Secondary | ICD-10-CM | POA: Diagnosis not present

## 2024-01-22 DIAGNOSIS — K219 Gastro-esophageal reflux disease without esophagitis: Secondary | ICD-10-CM | POA: Diagnosis present

## 2024-01-22 DIAGNOSIS — I129 Hypertensive chronic kidney disease with stage 1 through stage 4 chronic kidney disease, or unspecified chronic kidney disease: Secondary | ICD-10-CM | POA: Diagnosis present

## 2024-01-22 DIAGNOSIS — M6281 Muscle weakness (generalized): Secondary | ICD-10-CM | POA: Diagnosis not present

## 2024-01-22 DIAGNOSIS — M6259 Muscle wasting and atrophy, not elsewhere classified, multiple sites: Secondary | ICD-10-CM | POA: Diagnosis not present

## 2024-01-22 DIAGNOSIS — R262 Difficulty in walking, not elsewhere classified: Secondary | ICD-10-CM | POA: Diagnosis not present

## 2024-01-22 DIAGNOSIS — R0902 Hypoxemia: Secondary | ICD-10-CM | POA: Diagnosis not present

## 2024-01-22 DIAGNOSIS — M810 Age-related osteoporosis without current pathological fracture: Secondary | ICD-10-CM | POA: Diagnosis not present

## 2024-01-22 DIAGNOSIS — Y92014 Private driveway to single-family (private) house as the place of occurrence of the external cause: Secondary | ICD-10-CM | POA: Diagnosis not present

## 2024-01-22 DIAGNOSIS — Z4789 Encounter for other orthopedic aftercare: Secondary | ICD-10-CM | POA: Diagnosis not present

## 2024-01-22 DIAGNOSIS — R488 Other symbolic dysfunctions: Secondary | ICD-10-CM | POA: Diagnosis not present

## 2024-01-22 DIAGNOSIS — G4730 Sleep apnea, unspecified: Secondary | ICD-10-CM | POA: Diagnosis not present

## 2024-01-22 DIAGNOSIS — G4733 Obstructive sleep apnea (adult) (pediatric): Secondary | ICD-10-CM | POA: Diagnosis present

## 2024-01-22 DIAGNOSIS — Z66 Do not resuscitate: Secondary | ICD-10-CM | POA: Diagnosis present

## 2024-01-22 DIAGNOSIS — Z79899 Other long term (current) drug therapy: Secondary | ICD-10-CM | POA: Diagnosis not present

## 2024-01-22 DIAGNOSIS — F32A Depression, unspecified: Secondary | ICD-10-CM | POA: Diagnosis present

## 2024-01-22 DIAGNOSIS — S72011D Unspecified intracapsular fracture of right femur, subsequent encounter for closed fracture with routine healing: Secondary | ICD-10-CM | POA: Diagnosis not present

## 2024-01-22 DIAGNOSIS — W19XXXA Unspecified fall, initial encounter: Secondary | ICD-10-CM | POA: Diagnosis present

## 2024-01-22 HISTORY — PX: TOTAL HIP ARTHROPLASTY: SHX124

## 2024-01-22 LAB — SURGICAL PCR SCREEN
MRSA, PCR: NEGATIVE
Staphylococcus aureus: NEGATIVE

## 2024-01-22 LAB — CBC
HCT: 37.2 % (ref 36.0–46.0)
Hemoglobin: 12 g/dL (ref 12.0–15.0)
MCH: 28.2 pg (ref 26.0–34.0)
MCHC: 32.3 g/dL (ref 30.0–36.0)
MCV: 87.5 fL (ref 80.0–100.0)
Platelets: 219 10*3/uL (ref 150–400)
RBC: 4.25 MIL/uL (ref 3.87–5.11)
RDW: 12.9 % (ref 11.5–15.5)
WBC: 16.1 10*3/uL — ABNORMAL HIGH (ref 4.0–10.5)
nRBC: 0 % (ref 0.0–0.2)

## 2024-01-22 LAB — CREATININE, SERUM
Creatinine, Ser: 0.96 mg/dL (ref 0.44–1.00)
GFR, Estimated: 58 mL/min — ABNORMAL LOW (ref >60)

## 2024-01-22 SURGERY — ARTHROPLASTY, HIP, TOTAL, ANTERIOR APPROACH
Anesthesia: General | Site: Hip | Laterality: Right

## 2024-01-22 MED ORDER — SUGAMMADEX SODIUM 200 MG/2ML IV SOLN
INTRAVENOUS | Status: DC | PRN
  Administered 2024-01-22: 200 mg via INTRAVENOUS

## 2024-01-22 MED ORDER — ACETAMINOPHEN 500 MG PO TABS
1000.0000 mg | ORAL_TABLET | Freq: Four times a day (QID) | ORAL | Status: AC
  Administered 2024-01-22 – 2024-01-23 (×3): 1000 mg via ORAL
  Filled 2024-01-22 (×3): qty 2

## 2024-01-22 MED ORDER — DEXMEDETOMIDINE HCL IN NACL 80 MCG/20ML IV SOLN
INTRAVENOUS | Status: AC
  Filled 2024-01-22: qty 20

## 2024-01-22 MED ORDER — OXYCODONE HCL 5 MG PO TABS: 5.0000 mg | ORAL_TABLET | ORAL | Status: DC | PRN

## 2024-01-22 MED ORDER — PHENYLEPHRINE 80 MCG/ML (10ML) SYRINGE FOR IV PUSH (FOR BLOOD PRESSURE SUPPORT)
PREFILLED_SYRINGE | INTRAVENOUS | Status: DC | PRN
  Administered 2024-01-22: 80 ug via INTRAVENOUS

## 2024-01-22 MED ORDER — METHOCARBAMOL 1000 MG/10ML IJ SOLN: 500.0000 mg | Freq: Four times a day (QID) | INTRAMUSCULAR | Status: DC | PRN

## 2024-01-22 MED ORDER — FENTANYL CITRATE (PF) 250 MCG/5ML IJ SOLN
INTRAMUSCULAR | Status: AC
  Filled 2024-01-22: qty 5

## 2024-01-22 MED ORDER — PROPOFOL 10 MG/ML IV BOLUS
INTRAVENOUS | Status: AC
  Filled 2024-01-22: qty 20

## 2024-01-22 MED ORDER — PHENOL 1.4 % MT LIQD: 1.0000 | OROMUCOSAL | Status: DC | PRN

## 2024-01-22 MED ORDER — CHLORHEXIDINE GLUCONATE 4 % EX SOLN: 60.0000 mL | Freq: Once | CUTANEOUS | Status: DC

## 2024-01-22 MED ORDER — FENTANYL CITRATE (PF) 250 MCG/5ML IJ SOLN
INTRAMUSCULAR | Status: DC | PRN
Start: 2024-01-22 — End: 2024-01-22
  Administered 2024-01-22 (×2): 50 ug via INTRAVENOUS

## 2024-01-22 MED ORDER — LACTATED RINGERS IV SOLN: INTRAVENOUS | Status: DC

## 2024-01-22 MED ORDER — PHENYLEPHRINE HCL-NACL 20-0.9 MG/250ML-% IV SOLN
INTRAVENOUS | Status: DC | PRN
  Administered 2024-01-22: 75 ug/min via INTRAVENOUS

## 2024-01-22 MED ORDER — CEFAZOLIN SODIUM-DEXTROSE 2-4 GM/100ML-% IV SOLN
INTRAVENOUS | Status: AC
  Filled 2024-01-22: qty 100

## 2024-01-22 MED ORDER — TRANEXAMIC ACID-NACL 1000-0.7 MG/100ML-% IV SOLN
INTRAVENOUS | Status: AC
Start: 2024-01-22 — End: 2024-01-22
  Filled 2024-01-22: qty 100

## 2024-01-22 MED ORDER — SORBITOL 70 % SOLN: 30.0000 mL | Freq: Every day | Status: DC | PRN

## 2024-01-22 MED ORDER — ONDANSETRON HCL 4 MG/2ML IJ SOLN
INTRAMUSCULAR | Status: DC | PRN
  Administered 2024-01-22: 4 mg via INTRAVENOUS

## 2024-01-22 MED ORDER — PRONTOSAN WOUND IRRIGATION OPTIME
TOPICAL | Status: DC | PRN
  Administered 2024-01-22: 1 via TOPICAL

## 2024-01-22 MED ORDER — EPHEDRINE 5 MG/ML INJ
INTRAVENOUS | Status: AC
  Filled 2024-01-22: qty 5

## 2024-01-22 MED ORDER — TRANEXAMIC ACID 1000 MG/10ML IV SOLN
2000.0000 mg | Freq: Once | INTRAVENOUS | Status: AC
  Administered 2024-01-22: 2000 mg via TOPICAL
  Filled 2024-01-22: qty 20

## 2024-01-22 MED ORDER — 0.9 % SODIUM CHLORIDE (POUR BTL) OPTIME
TOPICAL | Status: DC | PRN
  Administered 2024-01-22: 1000 mL

## 2024-01-22 MED ORDER — BUPIVACAINE-MELOXICAM ER 400-12 MG/14ML IJ SOLN
INTRAMUSCULAR | Status: DC | PRN
  Administered 2024-01-22: 400 mg

## 2024-01-22 MED ORDER — MENTHOL 3 MG MT LOZG: 1.0000 | LOZENGE | OROMUCOSAL | Status: DC | PRN

## 2024-01-22 MED ORDER — VANCOMYCIN HCL 1 G IV SOLR
INTRAVENOUS | Status: DC | PRN
  Administered 2024-01-22: 1000 mg via TOPICAL

## 2024-01-22 MED ORDER — OXYCODONE HCL 5 MG PO TABS: 10.0000 mg | ORAL_TABLET | ORAL | Status: DC | PRN

## 2024-01-22 MED ORDER — CEFAZOLIN SODIUM-DEXTROSE 2-4 GM/100ML-% IV SOLN
2.0000 g | Freq: Four times a day (QID) | INTRAVENOUS | Status: AC
  Administered 2024-01-22 – 2024-01-23 (×3): 2 g via INTRAVENOUS
  Filled 2024-01-22 (×3): qty 100

## 2024-01-22 MED ORDER — MAGNESIUM CITRATE PO SOLN: 1.0000 | Freq: Once | ORAL | Status: DC | PRN

## 2024-01-22 MED ORDER — TRANEXAMIC ACID-NACL 1000-0.7 MG/100ML-% IV SOLN
1000.0000 mg | INTRAVENOUS | Status: AC
  Administered 2024-01-22: 1000 mg via INTRAVENOUS

## 2024-01-22 MED ORDER — CEFAZOLIN SODIUM-DEXTROSE 2-4 GM/100ML-% IV SOLN
2.0000 g | INTRAVENOUS | Status: AC
  Administered 2024-01-22: 2 g via INTRAVENOUS

## 2024-01-22 MED ORDER — SODIUM CHLORIDE 0.9 % IR SOLN
Status: DC | PRN
  Administered 2024-01-22: 1000 mL

## 2024-01-22 MED ORDER — SUCCINYLCHOLINE CHLORIDE 200 MG/10ML IV SOSY
PREFILLED_SYRINGE | INTRAVENOUS | Status: AC
  Filled 2024-01-22: qty 10

## 2024-01-22 MED ORDER — DEXAMETHASONE SODIUM PHOSPHATE 10 MG/ML IJ SOLN
INTRAMUSCULAR | Status: AC
  Filled 2024-01-22: qty 1

## 2024-01-22 MED ORDER — ONDANSETRON HCL 4 MG/2ML IJ SOLN
INTRAMUSCULAR | Status: AC
  Filled 2024-01-22: qty 2

## 2024-01-22 MED ORDER — METHOCARBAMOL 500 MG PO TABS
500.0000 mg | ORAL_TABLET | Freq: Four times a day (QID) | ORAL | Status: DC | PRN
  Administered 2024-01-23 – 2024-01-25 (×3): 500 mg via ORAL
  Filled 2024-01-22 (×3): qty 1

## 2024-01-22 MED ORDER — PROPOFOL 10 MG/ML IV BOLUS
INTRAVENOUS | Status: DC | PRN
  Administered 2024-01-22: 80 mg via INTRAVENOUS

## 2024-01-22 MED ORDER — MUPIROCIN 2 % EX OINT
1.0000 | TOPICAL_OINTMENT | Freq: Two times a day (BID) | CUTANEOUS | Status: DC
  Administered 2024-01-22 – 2024-01-25 (×7): 1 via NASAL
  Filled 2024-01-22 (×2): qty 22

## 2024-01-22 MED ORDER — POLYETHYLENE GLYCOL 3350 17 G PO PACK: 17.0000 g | PACK | Freq: Every day | ORAL | Status: DC | PRN

## 2024-01-22 MED ORDER — EPHEDRINE SULFATE-NACL 50-0.9 MG/10ML-% IV SOSY
PREFILLED_SYRINGE | INTRAVENOUS | Status: DC | PRN
  Administered 2024-01-22: 5 mg via INTRAVENOUS
  Administered 2024-01-22: 10 mg via INTRAVENOUS

## 2024-01-22 MED ORDER — ACETAMINOPHEN 325 MG PO TABS: 325.0000 mg | ORAL_TABLET | Freq: Four times a day (QID) | ORAL | Status: DC | PRN

## 2024-01-22 MED ORDER — ENOXAPARIN SODIUM 40 MG/0.4ML IJ SOSY
40.0000 mg | PREFILLED_SYRINGE | INTRAMUSCULAR | Status: DC
  Administered 2024-01-23: 40 mg via SUBCUTANEOUS
  Filled 2024-01-22: qty 0.4

## 2024-01-22 MED ORDER — VANCOMYCIN HCL 1000 MG IV SOLR
INTRAVENOUS | Status: AC
  Filled 2024-01-22: qty 20

## 2024-01-22 MED ORDER — DEXMEDETOMIDINE HCL IN NACL 200 MCG/50ML IV SOLN
INTRAVENOUS | Status: DC | PRN
Start: 2024-01-22 — End: 2024-01-22
  Administered 2024-01-22: 4 ug via INTRAVENOUS

## 2024-01-22 MED ORDER — POVIDONE-IODINE 10 % EX SWAB
2.0000 | Freq: Once | CUTANEOUS | Status: AC
  Administered 2024-01-22: 2 via TOPICAL

## 2024-01-22 MED ORDER — QUETIAPINE FUMARATE 25 MG PO TABS
25.0000 mg | ORAL_TABLET | ORAL | Status: DC
  Administered 2024-01-22 – 2024-01-24 (×3): 25 mg via ORAL
  Filled 2024-01-22 (×3): qty 1

## 2024-01-22 MED ORDER — HYDROMORPHONE HCL 1 MG/ML IJ SOLN
INTRAMUSCULAR | Status: AC
Start: 2024-01-22 — End: 2024-01-23
  Filled 2024-01-22: qty 1

## 2024-01-22 MED ORDER — ONDANSETRON HCL 4 MG/2ML IJ SOLN: 4.0000 mg | Freq: Four times a day (QID) | INTRAMUSCULAR | Status: DC | PRN

## 2024-01-22 MED ORDER — CHLORHEXIDINE GLUCONATE 0.12 % MT SOLN
15.0000 mL | Freq: Once | OROMUCOSAL | Status: AC
  Administered 2024-01-22: 15 mL via OROMUCOSAL
  Filled 2024-01-22: qty 15

## 2024-01-22 MED ORDER — DEXAMETHASONE SODIUM PHOSPHATE 10 MG/ML IJ SOLN
INTRAMUSCULAR | Status: DC | PRN
  Administered 2024-01-22: 4 mg via INTRAVENOUS

## 2024-01-22 MED ORDER — HYDROMORPHONE HCL 1 MG/ML IJ SOLN: 0.5000 mg | INTRAMUSCULAR | Status: DC | PRN

## 2024-01-22 MED ORDER — HYDROMORPHONE HCL 1 MG/ML IJ SOLN
0.2500 mg | INTRAMUSCULAR | Status: DC | PRN
  Administered 2024-01-22: 0.25 mg via INTRAVENOUS

## 2024-01-22 MED ORDER — ROCURONIUM BROMIDE 10 MG/ML (PF) SYRINGE
PREFILLED_SYRINGE | INTRAVENOUS | Status: DC | PRN
  Administered 2024-01-22 (×2): 20 mg via INTRAVENOUS

## 2024-01-22 MED ORDER — OXYCODONE HCL 5 MG PO TABS: 5.0000 mg | ORAL_TABLET | Freq: Once | ORAL | Status: DC | PRN

## 2024-01-22 MED ORDER — TRANEXAMIC ACID-NACL 1000-0.7 MG/100ML-% IV SOLN
1000.0000 mg | Freq: Once | INTRAVENOUS | Status: AC
  Administered 2024-01-22: 1000 mg via INTRAVENOUS
  Filled 2024-01-22: qty 100

## 2024-01-22 MED ORDER — ONDANSETRON HCL 4 MG PO TABS: 4.0000 mg | ORAL_TABLET | Freq: Four times a day (QID) | ORAL | Status: DC | PRN

## 2024-01-22 MED ORDER — ORAL CARE MOUTH RINSE: 15.0000 mL | Freq: Once | OROMUCOSAL | Status: AC

## 2024-01-22 MED ORDER — DOCUSATE SODIUM 100 MG PO CAPS
100.0000 mg | ORAL_CAPSULE | Freq: Two times a day (BID) | ORAL | Status: DC
  Administered 2024-01-22 – 2024-01-25 (×5): 100 mg via ORAL
  Filled 2024-01-22 (×6): qty 1

## 2024-01-22 MED ORDER — BUPIVACAINE-MELOXICAM ER 400-12 MG/14ML IJ SOLN
INTRAMUSCULAR | Status: AC
  Filled 2024-01-22: qty 1

## 2024-01-22 MED ORDER — AMISULPRIDE (ANTIEMETIC) 5 MG/2ML IV SOLN: 10.0000 mg | Freq: Once | INTRAVENOUS | Status: DC | PRN

## 2024-01-22 MED ORDER — OXYCODONE HCL 5 MG/5ML PO SOLN: 5.0000 mg | Freq: Once | ORAL | Status: DC | PRN

## 2024-01-22 MED ORDER — ROCURONIUM BROMIDE 10 MG/ML (PF) SYRINGE
PREFILLED_SYRINGE | INTRAVENOUS | Status: AC
  Filled 2024-01-22: qty 10

## 2024-01-22 MED ORDER — ALUM & MAG HYDROXIDE-SIMETH 200-200-20 MG/5ML PO SUSP: 30.0000 mL | ORAL | Status: DC | PRN

## 2024-01-22 MED ORDER — SUCCINYLCHOLINE CHLORIDE 200 MG/10ML IV SOSY
PREFILLED_SYRINGE | INTRAVENOUS | Status: DC | PRN
  Administered 2024-01-22: 100 mg via INTRAVENOUS

## 2024-01-22 MED ORDER — SODIUM CHLORIDE 0.9 % IV SOLN: 12.5000 mg | INTRAVENOUS | Status: DC | PRN

## 2024-01-22 SURGICAL SUPPLY — 52 items
BAG COUNTER SPONGE SURGICOUNT (BAG) ×1 IMPLANT
BAG DECANTER FOR FLEXI CONT (MISCELLANEOUS) ×1 IMPLANT
BLADE SAG 18X100X1.27 (BLADE) ×1 IMPLANT
COVER PERINEAL POST (MISCELLANEOUS) ×1 IMPLANT
COVER SURGICAL LIGHT HANDLE (MISCELLANEOUS) ×1 IMPLANT
CUP SECTOR GRIPTON 50MM (Cup) IMPLANT
DERMABOND ADVANCED .7 DNX12 (GAUZE/BANDAGES/DRESSINGS) IMPLANT
DRAPE C-ARM 42X72 X-RAY (DRAPES) ×1 IMPLANT
DRAPE POUCH INSTRU U-SHP 10X18 (DRAPES) ×1 IMPLANT
DRAPE STERI IOBAN 125X83 (DRAPES) ×1 IMPLANT
DRAPE U-SHAPE 47X51 STRL (DRAPES) ×2 IMPLANT
DRSG AQUACEL AG ADV 3.5X 6 (GAUZE/BANDAGES/DRESSINGS) IMPLANT
DRSG AQUACEL AG ADV 3.5X10 (GAUZE/BANDAGES/DRESSINGS) ×1 IMPLANT
DURAPREP 26ML APPLICATOR (WOUND CARE) ×2 IMPLANT
ELECTRODE BLDE 4.0 EZ CLN MEGD (MISCELLANEOUS) ×1 IMPLANT
ELECTRODE REM PT RTRN 9FT ADLT (ELECTROSURGICAL) ×1 IMPLANT
GLOVE BIOGEL PI IND STRL 7.0 (GLOVE) ×2 IMPLANT
GLOVE BIOGEL PI IND STRL 7.5 (GLOVE) ×5 IMPLANT
GLOVE ECLIPSE 7.0 STRL STRAW (GLOVE) ×2 IMPLANT
GLOVE SKINSENSE STRL SZ7.5 (GLOVE) ×1 IMPLANT
GLOVE SURG SYN 7.5 E (GLOVE) ×2 IMPLANT
GLOVE SURG SYN 7.5 PF PI (GLOVE) ×2 IMPLANT
GLOVE SURG UNDER POLY LF SZ7 (GLOVE) ×3 IMPLANT
GLOVE SURG UNDER POLY LF SZ7.5 (GLOVE) ×2 IMPLANT
GOWN STRL REUS W/ TWL LRG LVL3 (GOWN DISPOSABLE) IMPLANT
GOWN STRL REUS W/ TWL XL LVL3 (GOWN DISPOSABLE) ×1 IMPLANT
GOWN STRL SURGICAL XL XLNG (GOWN DISPOSABLE) ×1 IMPLANT
GOWN TOGA ZIPPER T7+ PEEL AWAY (MISCELLANEOUS) ×1 IMPLANT
HEAD FEM STD 32X+5 STRL (Hips) IMPLANT
HOOD PEEL AWAY T7 (MISCELLANEOUS) ×1 IMPLANT
IV NS IRRIG 3000ML ARTHROMATIC (IV SOLUTION) ×1 IMPLANT
KIT BASIN OR (CUSTOM PROCEDURE TRAY) ×1 IMPLANT
LINER ACETABULAR 32X50 (Liner) IMPLANT
MARKER SKIN DUAL TIP RULER LAB (MISCELLANEOUS) ×1 IMPLANT
NDL SPNL 18GX3.5 QUINCKE PK (NEEDLE) ×1 IMPLANT
NEEDLE SPNL 18GX3.5 QUINCKE PK (NEEDLE) ×1 IMPLANT
PACK TOTAL JOINT (CUSTOM PROCEDURE TRAY) ×1 IMPLANT
PACK UNIVERSAL I (CUSTOM PROCEDURE TRAY) ×1 IMPLANT
SCREW 6.5MMX30MM (Screw) IMPLANT
SET HNDPC FAN SPRY TIP SCT (DISPOSABLE) ×1 IMPLANT
SOLUTION PRONTOSAN WOUND 350ML (IRRIGATION / IRRIGATOR) ×1 IMPLANT
STEM FEMORAL SZ 5MM STD ACTIS (Stem) IMPLANT
SUT ETHIBOND 2 V 37 (SUTURE) ×1 IMPLANT
SUT VIC AB 0 CT1 27XBRD ANBCTR (SUTURE) ×1 IMPLANT
SUT VIC AB 1 CTX36XBRD ANBCTR (SUTURE) ×1 IMPLANT
SUT VIC AB 2-0 CT1 TAPERPNT 27 (SUTURE) ×2 IMPLANT
SYR 50ML LL SCALE MARK (SYRINGE) ×1 IMPLANT
TOWEL GREEN STERILE (TOWEL DISPOSABLE) ×1 IMPLANT
TRAY CATH INTERMITTENT SS 16FR (CATHETERS) IMPLANT
TRAY FOLEY W/BAG SLVR 16FR ST (SET/KITS/TRAYS/PACK) IMPLANT
TUBE SUCT ARGYLE STRL (TUBING) ×1 IMPLANT
YANKAUER SUCT BULB TIP NO VENT (SUCTIONS) ×1 IMPLANT

## 2024-01-22 NOTE — Op Note (Signed)
 ARTHROPLASTY, HIP, TOTAL, ANTERIOR APPROACH  Procedure Note Erika [name]   [id number]  Pre-op Diagnosis: Right femoral neck fracture     Post-op Diagnosis: same  Operative Findings Acute femoral neck fracture Grade 3-4 chondromalacia consistent with osteoarthritis   Operative Procedures  1. Total hip replacement; Right hip; uncemented [id number]   Surgeon: Dyana Glade, M.D.  Assist: Sharran Decent, PA-C   Anesthesia: general  Prosthesis: Depuy Acetabulum: Pinnacle 50 mm Femur: Actis 5 STD Head: 32 mm size: +5 Liner: +0 neutral Bearing Type: metal/poly  Total Hip Arthroplasty (Anterior Approach) Op Note:  After informed consent was obtained and the operative extremity marked in the holding area, the patient was brought back to the operating room and placed supine on the HANA table. Next, the operative extremity was prepped and draped in normal sterile fashion. Surgical timeout occurred verifying patient identification, surgical site, surgical procedure and administration of antibiotics.  An 8 cm longitudinal incision was made starting from 2 fingerbreadths lateral and inferior to the ASIS towards the lateral aspect of the patella.  A Hueter approach to the hip was performed, using the interval between tensor fascia lata and sartorius.  Dissection was carried bluntly down onto the anterior hip capsule. The lateral femoral circumflex vessels were identified and coagulated. A capsulotomy was performed and the capsular flaps tagged for later repair.  The neck osteotomy was performed 1 fingerbreadth above the lesser trochanter.  The napkin ring of bone was removed. The femoral head was removed which showed grade 3 changes with areas grade 4 changes in the weight bearing portions, the acetabular rim was cleared of soft tissue and grade 4 changes were seen in the acetabulum.  Decision was made to resurface the acetabulum and attention was turned to reaming the acetabulum.  Sequential  reaming was performed under fluoroscopic guidance down to the floor of the cotyloid fossa. We reamed to a size 49 mm, and then impacted the acetabular shell. A 30 mm cancellous screw was placed to secure the shell.  The liner was then placed after irrigation and attention turned to the femur.  After placing the femoral hook, the leg was taken to externally rotated, extended and adducted position taking care to perform soft tissue releases to allow for adequate mobilization of the femur. Soft tissue was cleared from the shoulder of the greater trochanter and the hook elevator used to improve exposure of the proximal femur. Sequential broaching performed up to a size 5. Standard trial neck and +1.0 head were placed. The leg was brought back up to neutral and the construct reduced.  The position and sizing of components, offset and leg lengths were checked using fluoroscopy. Stability of the construct was checked in 45 degrees of hip extension and 90 degrees of external rotation without any subluxation, shuck or impingement of prosthesis.  I felt she needed a little more leg length and offset so we decided to go with +5 femoral head.  We dislocated the prosthesis, dropped the leg back into position, removed trial components, and irrigated copiously. The final stem and head was then placed, the leg brought back up, the system reduced and fluoroscopy used to verify positioning.  Antibiotic irrigation was placed in the surgical wound.   We irrigated, obtained hemostasis and closed the capsule using #2 ethibond suture.  A topical mixture of 0.25% bupivacaine  and meloxicam was placed deep to the fascia.  One gram of vancomycin powder was placed in the surgical bed.   One gram of  topical tranexamic acid was injected into the joint.  The fascia was closed with #1 stratafix, the deep fat layer was closed with 0 vicryl, the subcutaneous layers closed with 2.0 Vicryl Plus and the skin closed with 2.0 nylon and dermabond. A  sterile dressing was applied. The patient was awakened in the operating room and taken to recovery in stable condition.  All sponge, needle, and instrument counts were correct at the end of the case.   Trellis Fries, my PA, was a medical necessity for opening, closing, limb positioning, retracting, exposing, and overall facilitation and timely completion of the surgery.  Position: supine  Complications: see description of procedure.  Time Out: performed   Drains/Packing: none  Estimated blood loss: see anesthesia record  Returned to Recovery Room: in good condition.   Antibiotics: yes   Mechanical VTE (DVT) Prophylaxis: sequential compression devices, TED thigh-high  Chemical VTE (DVT) Prophylaxis: lovenox    Fluid Replacement: see anesthesia record  Specimens Removed: 1 to pathology   Sponge and Instrument Count Correct? yes   PACU: portable radiograph - low AP   Plan/RTC: Return in 2 weeks for staple removal. Weight Bearing/Load Lower Extremity: full  Hip precautions: none Suture Removal: 2 weeks   N. Claria Crofts, MD [name] 2:27 PM   Implant Name Type Inv. Item Serial No. Manufacturer Lot No. LRB No. Used Action  CUP SECTOR GRIPTON - [id number] Cup CUP SECTOR GRIPTON  [employer] [id number] Right 1 Implanted  SCREW 6.5MMX30MM - [id number] Screw SCREW 6.5MMX30MM  [employer] [id number] Right 1 Implanted  LINER ACETABULAR 32X50 - [id number] Liner LINER ACETABULAR 32X50  [employer] [id number] Right 1 Implanted  STEM FEMORAL SZ STD ACTIS - [id number] Stem STEM FEMORAL SZ STD ACTIS  [employer] [id number] Right 1 Implanted  HEAD FEM STD 32X+5 STRL - [id number] Hips HEAD FEM STD 32X+5 STRL  [employer] [id number] Right 1 Implanted

## 2024-01-22 NOTE — H&P (Signed)
 PREOPERATIVE H&P  Chief Complaint: RIGHT FEMORAL FRACTURE  HPI: Erika [name] is a 83 y.o. female who presents for surgical treatment of RIGHT FEMORAL FRACTURE.  She denies any changes in medical history.  Past Surgical History:  Procedure Laterality Date   CATARACT EXTRACTION W/ INTRAOCULAR LENS  IMPLANT, BILATERAL     COLONOSCOPY     cortisone shots in back     ESOPHAGOGASTRODUODENOSCOPY     ESOPHAGOGASTRODUODENOSCOPY N/A 08/14/2013   Procedure: ESOPHAGOGASTRODUODENOSCOPY (EGD);  Surgeon: Mathew Solomon, MD;  Location: [facility];  Service: Endoscopy;  Laterality: N/A;   ESOPHAGOGASTRODUODENOSCOPY (EGD) WITH PROPOFOL  N/A 09/29/2015   Procedure: ESOPHAGOGASTRODUODENOSCOPY (EGD) WITH PROPOFOL ;  Surgeon: Ozell Blunt, MD;  Location: [facility];  Service: Endoscopy;  Laterality: N/A;   FOOT SURGERY Right 06-30-2005   dr duda   osteotomy first and second toe's   HIATAL HERNIA REPAIR N/A 10/29/2018   Procedure: LAPAROSCOPIC REPAIR OF HIATAL HERNIA WITH NISSEN FUNDOPLICATION, WITH MESH;  Surgeon: Ayesha Lente, MD;  Location: [facility];  Service: General;  Laterality: N/A;   HOT HEMOSTASIS N/A 08/14/2013   Procedure: HOT HEMOSTASIS (ARGON PLASMA COAGULATION/BICAP);  Surgeon: Mathew Solomon, MD;  Location: [facility];  Service: Endoscopy;  Laterality: N/A;   HOT HEMOSTASIS N/A 09/29/2015   Procedure: HOT HEMOSTASIS (ARGON PLASMA COAGULATION/BICAP);  Surgeon: Ozell Blunt, MD;  Location: [facility];  Service: Endoscopy;  Laterality: N/A;   PACEMAKER IMPLANT N/A 02/03/2020   Procedure: PACEMAKER IMPLANT;  Surgeon: Tammie Fall, MD;  Location: [facility];  Service: Cardiovascular;  Laterality: N/A;   RIGHT/LEFT HEART CATH AND CORONARY ANGIOGRAPHY N/A 10/02/2018   Procedure: RIGHT/LEFT HEART CATH AND CORONARY ANGIOGRAPHY;  Surgeon: Millicent Ally, MD;  Location: [facility];  Service: Cardiovascular;  Laterality: N/A;   TUBAL LIGATION  yrs ago     Family History  Problem  Relation Age of Onset   Heart disease Father        MI   Kidney failure Mother        pregnant with twins   Heart disease Paternal Uncle        MI   Heart disease Paternal Uncle        MI   Heart disease Paternal Uncle        MI   Heart disease Paternal Uncle        MI   Colon cancer Neg Hx    Liver cancer Neg Hx    Esophageal cancer Neg Hx    Rectal cancer Neg Hx    Stomach cancer Neg Hx    Allergies  Allergen Reactions   Donepezil Other (See Comments)    Feb 2021 -Continuous headache while on donepezil that resolved when stopped taking it    Xanax  [Alprazolam ] Other (See Comments)    Because not like herself and wild   Prior to Admission medications   Medication Sig Start Date End Date Taking? Authorizing Provider  acetaminophen  (TYLENOL ) 325 MG tablet Take 2 tablets (650 mg total) by mouth every 4 (four) hours as needed for headache or mild pain. Patient taking differently: Take 650 mg by mouth daily as needed for headache or mild pain (pain score 1-3). 02/04/20  Yes Tylene Galla, PA-C  amLODipine (NORVASC) 5 MG tablet Take 5 mg by mouth at bedtime. 06/08/21  Yes [provider]  Calcium  Carb-Cholecalciferol 600-800 MG-UNIT TABS Take 1 tablet by mouth every morning.   Yes [provider]  citalopram (  CELEXA) 10 MG tablet Take 10 mg by mouth at bedtime. 10/26/23  Yes [provider]  Cyanocobalamin  (VITAMIN B 12 PO) Take 1,000 mcg by mouth in the morning.   Yes [provider]  famotidine (PEPCID) 40 MG tablet Take 40 mg by mouth in the morning. 01/11/21  Yes [provider]  gabapentin  (NEURONTIN ) 100 MG capsule Take 200 mg by mouth at bedtime. 12/14/23  Yes [provider]  ibuprofen (ADVIL) 200 MG tablet Take 400 mg by mouth every 6 (six) hours as needed for mild pain (pain score 1-3) or moderate pain (pain score 4-6).   Yes [provider]  lamoTRIgine  (LAMICTAL ) 100 MG tablet Take 0.5 tablets (50 mg  total) by mouth at bedtime. 08/02/22  Yes Phebe Brasil, MD  memantine  (NAMENDA ) 10 MG tablet Take 1 tablet (10 mg total) by mouth 2 (two) times daily. 10/26/22  Yes Phebe Brasil, MD  metoprolol tartrate (LOPRESSOR) 25 MG tablet Take 21.5-25 mg by mouth 2 (two) times daily. Take 1 tablet by mouth every morning and 0.5 tablet every night 07/08/22  Yes [provider]  Probiotic Product (PROBIOTIC PO) Take 1 capsule by mouth daily.   Yes [provider]  VITAMIN D  PO Take 2,000 Units by mouth daily.   Yes [provider]     Positive ROS: All other systems have been reviewed and were otherwise negative with the exception of those mentioned in the HPI and as above.  Physical Exam: General: Alert, no acute distress Cardiovascular: No pedal edema Respiratory: No cyanosis, no use of accessory musculature GI: abdomen soft Skin: No lesions in the area of chief complaint Neurologic: Sensation intact distally Psychiatric: Patient is competent for consent with normal mood and affect Lymphatic: no lymphedema  MUSCULOSKELETAL: exam stable  Assessment: RIGHT FEMORAL FRACTURE  Plan: Plan for Procedure(s): ARTHROPLASTY, HIP, TOTAL, ANTERIOR APPROACH  The risks benefits and alternatives were discussed with the patient including but not limited to the risks of nonoperative treatment, versus surgical intervention including infection, bleeding, nerve injury,  blood clots, cardiopulmonary complications, morbidity, mortality, among others, and they were willing to proceed.   Claria Crofts, MD 01/22/2024 12:44 PM

## 2024-01-22 NOTE — Discharge Instructions (Signed)
    1. Change dressings as needed 2. May shower but keep incisions covered and dry 3. Take your prescribed blood thinner to prevent blood clots 4. Take stool softeners as needed 5. Take pain meds as needed  I have reviewed the patient's history and given the presence of a fragility fracture, I have deemed the necessity of a osteoporosis management referral or confirmed that the patient is currently enrolled in a osteoporosis treatment program.

## 2024-01-22 NOTE — Progress Notes (Signed)
 Transition of Care [facility]) - CAGE-AID Screening   Patient Details  Name: Erika [name] MRN: [id number] Date of Birth: 04/02/1941  Asa Bjork, RN Trauma Response Nurse Phone Number: [phone] 01/22/2024, 10:36 AM    CAGE-AID Screening:    Have You Ever Felt You Ought to Cut Down on Your Drinking or Drug Use?: No Have People Annoyed You By Critizing Your Drinking Or Drug Use?: No Have You Felt Bad Or Guilty About Your Drinking Or Drug Use?: No Have You Ever Had a Drink or Used Drugs First Thing In The Morning to Steady Your Nerves or to Get Rid of a Hangover?: No CAGE-AID Score: 0  Substance Abuse Education Offered: (S) No (No services needed- denies any alcohol/drug use)

## 2024-01-22 NOTE — Anesthesia Preprocedure Evaluation (Signed)
 Anesthesia Evaluation  Patient identified by MRN, date of birth, ID band Patient awake    Reviewed: Allergy & Precautions, H&P , NPO status , Patient's Chart, lab work & pertinent test results  Airway Mallampati: II  TM Distance: >3 FB Neck ROM: Full    Dental no notable dental hx.    Pulmonary sleep apnea    Pulmonary exam normal breath sounds clear to auscultation       Cardiovascular hypertension, Pt. on medications Normal cardiovascular exam+ pacemaker  Rhythm:Regular Rate:Normal     Neuro/Psych       Dementia negative neurological ROS  negative psych ROS   GI/Hepatic Neg liver ROS,GERD  ,,  Endo/Other  negative endocrine ROS    Renal/GU negative Renal ROS  negative genitourinary   Musculoskeletal  (+) Arthritis , Osteoarthritis,    Abdominal   Peds negative pediatric ROS (+)  Hematology negative hematology ROS (+)   Anesthesia Other Findings   Reproductive/Obstetrics negative OB ROS                             Anesthesia Physical Anesthesia Plan  ASA: 3  Anesthesia Plan: General   Post-op Pain Management:    Induction: Intravenous  PONV Risk Score and Plan: 3 and Ondansetron , Dexamethasone , Midazolam  and Treatment Denson vary due to age or medical condition  Airway Management Planned: Oral ETT  Additional Equipment:   Intra-op Plan:   Post-operative Plan: Extubation in OR  Informed Consent: I have reviewed the patients History and Physical, chart, labs and discussed the procedure including the risks, benefits and alternatives for the proposed anesthesia with the patient or authorized representative who has indicated his/her understanding and acceptance.     Dental advisory given  Plan Discussed with: CRNA  Anesthesia Plan Comments:        Anesthesia Quick Evaluation

## 2024-01-22 NOTE — Care Management Obs Status (Signed)
 MEDICARE OBSERVATION STATUS NOTIFICATION   Patient Details  Name: Erika [name] MRN: [id number] Date of Birth: 10/26/1940   Medicare Observation Status Notification Given:  Yes    Jannine Meo, RN 01/22/2024, 9:48 AM

## 2024-01-22 NOTE — Anesthesia Postprocedure Evaluation (Signed)
 Anesthesia Post Note  Patient: Erika [name]  Procedure(s) Performed: ARTHROPLASTY, HIP, TOTAL, ANTERIOR APPROACH (Right: Hip)     Patient location during evaluation: PACU Anesthesia Type: General Level of consciousness: awake and alert Pain management: pain level controlled Vital Signs Assessment: post-procedure vital signs reviewed and stable Respiratory status: spontaneous breathing, nonlabored ventilation and respiratory function stable Cardiovascular status: blood pressure returned to baseline and stable Postop Assessment: no apparent nausea or vomiting Anesthetic complications: no   No notable events documented.  Last Vitals:  Vitals:   01/22/24 1540 01/22/24 1553  BP: 137/70 (!) 146/58  Pulse: 60 60  Resp: 12   Temp: 36.5 C 36.5 C  SpO2: 98% 96%    Last Pain:  Vitals:   01/22/24 1553  TempSrc: Oral  PainSc:    Pain Goal: Patients Stated Pain Goal: 3 (01/22/24 1515)                 Earvin Goldberg

## 2024-01-22 NOTE — Transfer of Care (Signed)
 Immediate Anesthesia Transfer of Care Note  Patient: Erika [name]  Procedure(s) Performed: ARTHROPLASTY, HIP, TOTAL, ANTERIOR APPROACH (Right: Hip)  Patient Location: PACU  Anesthesia Type:General  Level of Consciousness: awake, alert , and oriented  Airway & Oxygen Therapy: Patient Spontanous Breathing and Patient connected to nasal cannula oxygen  Post-op Assessment: Report given to RN and Post -op Vital signs reviewed and stable  Post vital signs: Reviewed and stable  Last Vitals:  Vitals Value Taken Time  BP 145/60 01/22/24 1500  Temp    Pulse 62 01/22/24 1503  Resp 18 01/22/24 1503  SpO2 98 % 01/22/24 1503  Vitals shown include unfiled device data.  Last Pain:  Vitals:   01/22/24 1051  TempSrc: (P) Oral  PainSc:          Complications: No notable events documented.

## 2024-01-22 NOTE — Consult Note (Signed)
 Reason for Consult:Right hip fx Referring Physician: Modena Andes Time called: 0730 Time at bedside: 0859   Erika [name] is an 83 y.o. female.  HPI: Erika [name] was at home and got out of bed unassisted, went outside, and fell in the driveway. She was found down and c/o hip pain and could not get up. She was brought to the ED where x-rays showed a right hip fx and orthopedic surgery was consulted. She is moderately demented and cannot contribute to history. She lives at home with her husband with close family support.  Past Medical History:  Diagnosis Date   Absence of appendix, congenital    Arthritis    Complete heart block (HCC) 02/02/2020   GERD (gastroesophageal reflux disease)    Hiatal hernia    History of colon polyps    History of gastric polyp    Hypercholesterolemia    Hypertension    PER PT NO MEDICINE   Irritable bowel syndrome    resolved last few years   Mobitz type 2 second degree heart block    intermittant,  cardiac work-up done;  Cardiac cath 10-02-2018,  echo 09-24-2018, event monitor 09-24-2018 (all results in epic)   Neck pain    right sided neck pain   OSA (obstructive sleep apnea)    does not use c-pap did not like   Osteoporosis 06/2017   T score -2.8   PSVT (paroxysmal supraventricular tachycardia) (HCC)    short run on event monitor 09-24-2018   Wears glasses     Past Surgical History:  Procedure Laterality Date   CATARACT EXTRACTION W/ INTRAOCULAR LENS  IMPLANT, BILATERAL     COLONOSCOPY     cortisone shots in back     ESOPHAGOGASTRODUODENOSCOPY     ESOPHAGOGASTRODUODENOSCOPY N/A 08/14/2013   Procedure: ESOPHAGOGASTRODUODENOSCOPY (EGD);  Surgeon: Mathew Solomon, MD;  Location: [facility];  Service: Endoscopy;  Laterality: N/A;   ESOPHAGOGASTRODUODENOSCOPY (EGD) WITH PROPOFOL  N/A 09/29/2015   Procedure: ESOPHAGOGASTRODUODENOSCOPY (EGD) WITH PROPOFOL ;  Surgeon: Ozell Blunt, MD;  Location: [facility];  Service: Endoscopy;  Laterality: N/A;   FOOT SURGERY  Right 06-30-2005   dr duda   osteotomy first and second toe's   HIATAL HERNIA REPAIR N/A 10/29/2018   Procedure: LAPAROSCOPIC REPAIR OF HIATAL HERNIA WITH NISSEN FUNDOPLICATION, WITH MESH;  Surgeon: Ayesha Lente, MD;  Location: [facility];  Service: General;  Laterality: N/A;   HOT HEMOSTASIS N/A 08/14/2013   Procedure: HOT HEMOSTASIS (ARGON PLASMA COAGULATION/BICAP);  Surgeon: Mathew Solomon, MD;  Location: [facility];  Service: Endoscopy;  Laterality: N/A;   HOT HEMOSTASIS N/A 09/29/2015   Procedure: HOT HEMOSTASIS (ARGON PLASMA COAGULATION/BICAP);  Surgeon: Ozell Blunt, MD;  Location: [facility];  Service: Endoscopy;  Laterality: N/A;   PACEMAKER IMPLANT N/A 02/03/2020   Procedure: PACEMAKER IMPLANT;  Surgeon: Tammie Fall, MD;  Location: [facility];  Service: Cardiovascular;  Laterality: N/A;   RIGHT/LEFT HEART CATH AND CORONARY ANGIOGRAPHY N/A 10/02/2018   Procedure: RIGHT/LEFT HEART CATH AND CORONARY ANGIOGRAPHY;  Surgeon: Millicent Ally, MD;  Location: [facility];  Service: Cardiovascular;  Laterality: N/A;   TUBAL LIGATION  yrs ago    Family History  Problem Relation Age of Onset   Heart disease Father        MI   Kidney failure Mother        pregnant with twins   Heart disease Paternal Uncle        MI   Heart disease Paternal  Uncle        MI   Heart disease Paternal Uncle        MI   Heart disease Paternal Uncle        MI   Colon cancer Neg Hx    Liver cancer Neg Hx    Esophageal cancer Neg Hx    Rectal cancer Neg Hx    Stomach cancer Neg Hx     Social History:  reports that she has never smoked. She has never used smokeless tobacco. She reports that she does not drink alcohol and does not use drugs.  Allergies:  Allergies  Allergen Reactions   Donepezil Other (See Comments)    Feb 2021 -Continuous headache while on donepezil that resolved when stopped taking it    Xanax  [Alprazolam ] Other (See Comments)    Because not like herself and wild     Medications: I have reviewed the patient's current medications.  Results for orders placed or performed during the hospital encounter of 01/21/24 (from the past 48 hours)  CBC with Differential     Status: Abnormal   Collection Time: 01/21/24  6:35 PM  Result Value Ref Range   WBC 10.9 (H) 4.0 - 10.5 K/uL   RBC 4.36 3.87 - 5.11 MIL/uL   Hemoglobin 12.1 12.0 - 15.0 g/dL   HCT 11.9 14.7 - 82.9 %   MCV 91.3 80.0 - 100.0 fL   MCH 27.8 26.0 - 34.0 pg   MCHC 30.4 30.0 - 36.0 g/dL   RDW 56.2 13.0 - 86.5 %   Platelets 211 150 - 400 K/uL   nRBC 0.0 0.0 - 0.2 %   Neutrophils Relative % 75 %   Neutro Abs 8.3 (H) 1.7 - 7.7 K/uL   Lymphocytes Relative 17 %   Lymphs Abs 1.9 0.7 - 4.0 K/uL   Monocytes Relative 5 %   Monocytes Absolute 0.6 0.1 - 1.0 K/uL   Eosinophils Relative 1 %   Eosinophils Absolute 0.1 0.0 - 0.5 K/uL   Basophils Relative 1 %   Basophils Absolute 0.1 0.0 - 0.1 K/uL   Immature Granulocytes 1 %   Abs Immature Granulocytes 0.07 0.00 - 0.07 K/uL    Comment: Performed at [facility], [address] [place], [place] [address]  Comprehensive metabolic panel     Status: Abnormal   Collection Time: 01/21/24  6:35 PM  Result Value Ref Range   Sodium 139 135 - 145 mmol/L   Potassium 3.5 3.5 - 5.1 mmol/L   Chloride 102 98 - 111 mmol/L   CO2 28 22 - 32 mmol/L   Glucose, Bld 116 (H) 70 - 99 mg/dL    Comment: Glucose reference range applies only to samples taken after fasting for at least 8 hours.   BUN 17 8 - 23 mg/dL   Creatinine, Ser 6.29 (H) 0.44 - 1.00 mg/dL   Calcium  9.1 8.9 - 10.3 mg/dL   Total Protein 6.9 6.5 - 8.1 g/dL   Albumin 3.7 3.5 - 5.0 g/dL   AST 18 15 - 41 U/L   ALT 10 0 - 44 U/L   Alkaline Phosphatase 79 38 - 126 U/L   Total Bilirubin 0.7 0.0 - 1.2 mg/dL   GFR, Estimated 50 (L) >60 mL/min    Comment: (NOTE) Calculated using the CKD-EPI Creatinine Equation (2021)    Anion gap 9 5 - 15    Comment: Performed at [facility], [address] [place], [place]  [address]  Type and screen [facility]     Status: None   Collection Time: 01/21/24  6:35 PM  Result Value Ref Range   ABO/RH(D) O POS    Antibody Screen NEG    Sample Expiration      01/24/2024,2359 Performed at [facility], [address], [place], [place] [address]   [place]     Status: None   Collection Time: 01/21/24  6:35 PM  Result Value Ref Range   Total CK 44 38 - 234 U/L    Comment: Performed at [facility], [address], [place], [place] [address]  Surgical PCR screen     Status: None   Collection Time: 01/22/24  1:22 AM   Specimen: Nasal Mucosa; Nasal Swab  Result Value Ref Range   MRSA, PCR NEGATIVE NEGATIVE   Staphylococcus aureus NEGATIVE NEGATIVE    Comment: (NOTE) The Xpert SA Assay (FDA approved for NASAL specimens in patients 45 years of age and older), is one component of a comprehensive surveillance program. It is not intended to diagnose infection nor to guide or monitor treatment. Performed at [facility], [address], [place], [place] [address]     CT HEAD WO CONTRAST ( ) Addendum Date: 01/21/2024 ADDENDUM REPORT: 01/21/2024 20:26 ADDENDUM: Bilateral biapical pulmonary micronodules-no further follow-up indicated. Electronically Signed   By: Morgane  Naveau M.D.   On: 01/21/2024 20:26   Result Date: 01/21/2024 CLINICAL DATA:  Head trauma, minor, normal mental status (Age 46-64y) Unwitnessed fall found outside by a neighbor. She is unsure of what happened or how she ended up on the ground outside. Shortening and rotation of the right leg. Complaining of pain in the right hip. EXAM: CT HEAD WITHOUT CONTRAST CT CERVICAL SPINE WITHOUT CONTRAST TECHNIQUE: Multidetector CT imaging of the head and cervical spine was performed following the standard protocol without intravenous contrast. Multiplanar CT image reconstructions of the cervical spine were also  generated. RADIATION DOSE REDUCTION: This exam was performed according to the departmental dose-optimization program which includes automated exposure control, adjustment of the mA and/or kV according to patient size and/or use of iterative reconstruction technique. COMPARISON:  CT head 01/19/2023, CT head 02/02/2020 FINDINGS: CT HEAD FINDINGS Brain: Cerebral ventricle sizes are concordant with the degree of cerebral volume loss. Patchy and confluent areas of decreased attenuation are noted throughout the deep and periventricular white matter of the cerebral hemispheres bilaterally, compatible with chronic microvascular ischemic disease. No evidence of large-territorial acute infarction. No parenchymal hemorrhage. No mass lesion. No extra-axial collection. No mass effect or midline shift. No hydrocephalus. Basilar cisterns are patent. Vascular: No hyperdense vessel. Atherosclerotic calcifications are present within the cavernous internal carotid and vertebral arteries. Skull: No acute fracture or focal lesion. Sinuses/Orbits: Paranasal sinuses and mastoid air cells are clear. The orbits are unremarkable. Other: None. CT CERVICAL SPINE FINDINGS Alignment: Normal. Skull base and vertebrae: Multilevel moderate severe degenerative changes of the spine. No severe osseous foraminal or central canal stenosis. No acute fracture. No aggressive appearing focal osseous lesion or focal pathologic process. Soft tissues and spinal canal: No prevertebral fluid or swelling. No visible canal hematoma. Upper chest: Biapical pleural/pulmonary scarring. Other: No genius thyroid  glands with 1.4 cm right thyroid  hypodense nodule-no further follow-up indicated. Atherosclerotic plaque of the carotid arteries within the neck. IMPRESSION: 1. No acute intracranial abnormality. 2. No acute displaced fracture or traumatic listhesis of the cervical spine. Electronically Signed: By: Morgane  Naveau M.D. On: 01/21/2024  20:21   CT Cervical Spine  Wo Contrast Result Date: 01/21/2024 CLINICAL DATA:  CLINICAL DATA: Head trauma, minor, normal mental status (Age 57-64y) Unwitnessed fall found outside by a neighbor. She is unsure of what happened or how she ended up on the ground outside. Shortening and rotation of the right leg. Complaining of pain in the right hip. EXAM: CT HEAD WITHOUT CONTRAST CT CERVICAL SPINE WITHOUT CONTRAST TECHNIQUE: Multidetector CT imaging of the head and cervical spine was performed following the standard protocol without intravenous contrast. Multiplanar CT image reconstructions of the cervical spine were also generated. RADIATION DOSE REDUCTION: This exam was performed according to the departmental dose-optimization program which includes automated exposure control, adjustment of the mA and/or kV according to patient size and/or use of iterative reconstruction technique. COMPARISON: CT head 01/19/2023, CT head 02/02/2020 FINDINGS: CT HEAD FINDINGS Brain: Cerebral ventricle sizes are concordant with the degree of cerebral volume loss. Patchy and confluent areas of decreased attenuation are noted throughout the deep and periventricular white matter of the cerebral hemispheres bilaterally, compatible with chronic microvascular ischemic disease. No evidence of large-territorial acute infarction. No parenchymal hemorrhage. No mass lesion. No extra-axial collection. No mass effect or midline shift. No hydrocephalus. Basilar cisterns are patent. Vascular: No hyperdense vessel. Atherosclerotic calcifications are present within the cavernous internal carotid and vertebral arteries. Skull: No acute fracture or focal lesion. Sinuses/Orbits: Paranasal sinuses and mastoid air cells are clear. The orbits are unremarkable. Other: None. CT CERVICAL SPINE FINDINGS Alignment: Normal. Skull base and vertebrae: Multilevel moderate severe degenerative changes of the spine. No severe osseous foraminal or central canal stenosis. No acute fracture. No  aggressive appearing focal osseous lesion or focal pathologic process. Soft tissues and spinal canal: No prevertebral fluid or swelling. No visible canal hematoma. Upper chest: Biapical pleural/pulmonary scarring. Bilateral biapical pulmonary micronodules-no further follow-up indicated. Other: No genius thyroid  glands with 1.4 cm right thyroid  hypodense nodule-no further follow-up indicated. Atherosclerotic plaque of the carotid arteries within the neck. IMPRESSION: 1. No acute intracranial abnormality. 2. No acute displaced fracture or traumatic listhesis of the cervical spine. Electronically Signed   By: Morgane  Naveau M.D.   On: 01/21/2024 20:26   DG Chest 1 View Result Date: 01/21/2024 CLINICAL DATA:  pre op EXAM: CHEST  1 VIEW COMPARISON:  Chest x-ray 02/04/2020 FINDINGS: Left chest 2 lead pacemaker. The heart and mediastinal contours are within normal limits. Atherosclerotic plaque. No focal consolidation. No pulmonary edema. No pleural effusion. No pneumothorax. No acute osseous abnormality. IMPRESSION: 1. No active disease. 2.  Aortic Atherosclerosis (ICD10-I70.0). Electronically Signed   By: Morgane  Naveau M.D.   On: 01/21/2024 20:25   DG HIP UNILAT WITH PELVIS 2-3 VIEWS RIGHT Result Date: 01/21/2024 CLINICAL DATA:  [id number] Fall [id number] EXAM: DG HIP (WITH OR WITHOUT PELVIS) 2-3V RIGHT COMPARISON:  None Available. FINDINGS: Limited evaluation due to overlapping osseous structures and overlying soft tissues. Acute displaced poorly visualized right subcapital femoral neck fracture. No right hip dislocation. No left hip dislocation or acute displaced fracture. No acute displaced fracture or diastasis of the bones of the pelvis. There is no evidence of severe arthropathy or other focal bone abnormality. Densities overlying the pelvis and right proximal thigh on frontal view appear external to patient on lateral view. IMPRESSION: Acute displaced poorly visualized right subcapital femoral neck fracture.  Electronically Signed   By: Morgane  Naveau M.D.   On: 01/21/2024 20:24    Review of Systems  Unable to perform ROS: Dementia   Blood  pressure (!) 145/77, pulse (!) 59, temperature 98 F (36.7 C), temperature source Oral, resp. rate 17, height 5\' 2"  (1.575 m), weight 59 kg, SpO2 100%. Physical Exam Constitutional:      General: She is not in acute distress.    Appearance: She is well-developed. She is not diaphoretic.  HENT:     Head: Normocephalic and atraumatic.  Eyes:     General: No scleral icterus.       Right eye: No discharge.        Left eye: No discharge.     Conjunctiva/sclera: Conjunctivae normal.  Cardiovascular:     Rate and Rhythm: Normal rate and regular rhythm.  Pulmonary:     Effort: Pulmonary effort is normal. No respiratory distress.  Musculoskeletal:     Cervical back: Normal range of motion.     Comments: RLE No traumatic wounds, ecchymosis, or rash  Mod TTP hip  No knee or ankle effusion  Knee stable to varus/ valgus and anterior/posterior stress  Sens DPN, SPN, TN could not assess  Motor EHL, ext, flex, evers grossly intact  DP 2+, PT 2+, No significant edema  Skin:    General: Skin is warm and dry.  Neurological:     Mental Status: She is alert.  Psychiatric:        Mood and Affect: Mood normal.        Behavior: Behavior normal.     Assessment/Plan: Right hip fx -- Plan hip hemi today with Dr. Christiane Cowing. Please keep NPO.    Georganna Kin, PA-C Orthopedic Surgery [phone] 01/22/2024, 9:15 AM

## 2024-01-22 NOTE — Anesthesia Procedure Notes (Signed)
 Procedure Name: Intubation Date/Time: 01/22/2024 1:15 PM  Performed by: Charon Akamine, CRNAPre-anesthesia Checklist: Patient identified, Emergency Drugs available, Suction available and Patient being monitored Patient Re-evaluated:Patient Re-evaluated prior to induction Oxygen Delivery Method: Circle System Utilized Preoxygenation: Pre-oxygenation with 100% oxygen Induction Type: IV induction Ventilation: Mask ventilation without difficulty Laryngoscope Size: Mac and 3 Grade View: Grade II Tube type: Oral Tube size: 7.0 mm Number of attempts: 1 Airway Equipment and Method: Stylet and Oral airway Placement Confirmation: ETT inserted through vocal cords under direct vision, positive ETCO2 and breath sounds checked- equal and bilateral Secured at: 21 cm Tube secured with: Tape Dental Injury: Teeth and Oropharynx as per pre-operative assessment

## 2024-01-22 NOTE — Progress Notes (Signed)
 OT Cancellation Note  Patient Details Name: Erika [name] MRN: [id number] DOB: 1940-09-29   Cancelled Treatment:    Reason Eval/Treat Not Completed: Patient at procedure or test/ unavailable.  Scheduled for sx this date.  Hold and await post op orders.  Tagen Milby D Khameron Gruenwald 01/22/2024, 10:54 AM 01/22/2024  RP, OTR/L  Acute Rehabilitation Services  Office:  [phone]

## 2024-01-22 NOTE — Plan of Care (Signed)
 ?  Problem: Clinical Measurements: ?Goal: Will remain free from infection ?Outcome: Progressing ?  ?

## 2024-01-22 NOTE — Progress Notes (Signed)
 PT Cancellation Note  Patient Details Name: Erika [name] MRN: [id number] DOB: 1940/12/15   Cancelled Treatment:    Reason Eval/Treat Not Completed: Patient at procedure or test/unavailable. Pending surgery today for hip fx. PT will follow up after surgery is complete.   Rexie Catena 01/22/2024, 11:59 AM

## 2024-01-22 NOTE — Progress Notes (Addendum)
 PROGRESS NOTE    Erika [name]  ZOX:[id number] DOB: [birth year] DOA: 01/21/2024 PCP: Alvie Ax, MD (Inactive)   Brief Narrative:  Erika [name] is a 83 y.o. female with medical history significant of complete heart block status post pacemaker 2002, GERD, hypertension, hyperlipidemia, dementia, severe osteoporosis who presented to emergency department with an unwitnessed fall in her driveway.  She was brought into the ED by EMS.  On arrival she is afebrile hemodynamically stable.  X-ray of hip showed acute displaced right femoral neck fracture.  CT head showed no acute abnormalities.  Orthopedics was consulted and patient was admitted to [facility] for operative intervention in the morning.   Assessment & Plan:   Principal Problem:   Hip fracture (HCC)  Right femoral neck fracture due to mechanical fall: Orthopedics on board, surgery planned for today.  Management per them.  Appreciate their help.  Severe dementia/cognitive impairment: Continue memantine  and lamotrigine .  Patient at risk of hospital-acquired delirium due to surgery and dementia, will start her on Seroquel tonight to prevent delirium.  Discussed this with the daughter.  GERD: Continue Pepcid.  Essential hypertension: Controlled.  Continue amlodipine and metoprolol.  Depression: Continue Celexa.  DVT prophylaxis: SCDs Start: 01/21/24 2210   Code Status: Limited: Do not attempt resuscitation (DNR) -DNR-LIMITED -Do Not Intubate/DNI   Family Communication: Daughter and husband present at bedside.  Plan of care discussed with patient in length and he/she verbalized understanding and agreed with it.  Status is: Observation The patient will require care spanning > 2 midnights and should be moved to inpatient because: Patient is going to have surgery today and then she will need rehabilitation and possible safe discharge.   Estimated body mass index is 23.78 kg/m as calculated from the following:   Height as of this  encounter: 5\' 2"  (1.575 m).   Weight as of this encounter: 59 kg.    Nutritional Assessment: Body mass index is 23.78 kg/m.Aaron Aas Seen by dietician.  I agree with the assessment and plan as outlined below: Nutrition Status:        . Skin Assessment: I have examined the patient's skin and I agree with the wound assessment as performed by the wound care RN as outlined below:    Consultants:  Orthopedics  Procedures:  As above  Antimicrobials:  Anti-infectives (From admission, onward)    None         Subjective: Patient seen and examined, husband and daughter at the bedside.  Patient is slightly drowsy but easily arousable.  She is oriented to self and if that is her baseline according to the daughter.  Patient complains of right hip pain with movement but feels better when she is still.  Objective: Vitals:   01/21/24 2130 01/21/24 2200 01/22/24 0442 01/22/24 0717  BP: 137/64 (!) 158/70 (!) 147/71 (!) 145/77  Pulse: 62 63 60 (!) 59  Resp: 17 18 17    Temp:  98.1 F (36.7 C) 98.1 F (36.7 C) 98 F (36.7 C)  TempSrc:  Oral Oral Oral  SpO2: 95% 99% 99% 100%  Weight:      Height:       No intake or output data in the 24 hours ending 01/22/24 1022 Filed Weights   01/21/24 1825  Weight: 59 kg    Examination:  General exam: Appears calm and comfortable  Respiratory system: Clear to auscultation. Respiratory effort normal. Cardiovascular system: S1 & S2 heard, RRR. No JVD, murmurs, rubs, gallops or clicks. No pedal edema.  Gastrointestinal system: Abdomen is nondistended, soft and nontender. No organomegaly or masses felt. Normal bowel sounds heard. Central nervous system: Alert and oriented x 1. No focal neurological deficits. Skin: No rashes, lesions or ulcers Psychiatry: Judgement and insight appear poor  Data Reviewed: I have personally reviewed following labs and imaging studies  CBC: Recent Labs  Lab 01/21/24 1835  WBC 10.9*  NEUTROABS 8.3*  HGB 12.1   HCT 39.8  MCV 91.3  PLT 211   Basic Metabolic Panel: Recent Labs  Lab 01/21/24 1835  NA 139  K 3.5  CL 102  CO2 28  GLUCOSE 116*  BUN 17  CREATININE 1.10*  CALCIUM  9.1   GFR: Estimated Creatinine Clearance: 30.1 mL/min (A) (by C-G formula based on SCr of 1.1 mg/dL (H)). Liver Function Tests: Recent Labs  Lab 01/21/24 1835  AST 18  ALT 10  ALKPHOS 79  BILITOT 0.7  PROT 6.9  ALBUMIN 3.7   No results for input(s): "LIPASE", "AMYLASE" in the last 168 hours. No results for input(s): "AMMONIA" in the last 168 hours. Coagulation Profile: No results for input(s): "INR", "PROTIME" in the last 168 hours. Cardiac Enzymes: Recent Labs  Lab 01/21/24 1835  CKTOTAL 44   BNP (last 3 results) No results for input(s): "PROBNP" in the last 8760 hours. HbA1C: No results for input(s): "HGBA1C" in the last 72 hours. CBG: No results for input(s): "GLUCAP" in the last 168 hours. Lipid Profile: No results for input(s): "CHOL", "HDL", "LDLCALC", "TRIG", "CHOLHDL", "LDLDIRECT" in the last 72 hours. Thyroid  Function Tests: No results for input(s): "TSH", "T4TOTAL", "FREET4", "T3FREE", "THYROIDAB" in the last 72 hours. Anemia Panel: No results for input(s): "VITAMINB12", "FOLATE", "FERRITIN", "TIBC", "IRON", "RETICCTPCT" in the last 72 hours. Sepsis Labs: No results for input(s): "PROCALCITON", "LATICACIDVEN" in the last 168 hours.  Recent Results (from the past 240 hours)  Surgical PCR screen     Status: None   Collection Time: 01/22/24  1:22 AM   Specimen: Nasal Mucosa; Nasal Swab  Result Value Ref Range Status   MRSA, PCR NEGATIVE NEGATIVE Final   Staphylococcus aureus NEGATIVE NEGATIVE Final    Comment: (NOTE) The Xpert SA Assay (FDA approved for NASAL specimens in patients 4 years of age and older), is one component of a comprehensive surveillance program. It is not intended to diagnose infection nor to guide or monitor treatment. Performed at [facility],  [address] [place], [place] [address]      Radiology Studies: CT HEAD WO CONTRAST ( ) Addendum Date: 01/21/2024 ADDENDUM REPORT: 01/21/2024 20:26 ADDENDUM: Bilateral biapical pulmonary micronodules-no further follow-up indicated. Electronically Signed   By: Morgane  Naveau M.D.   On: 01/21/2024 20:26   Result Date: 01/21/2024 CLINICAL DATA:  Head trauma, minor, normal mental status (Age 50-64y) Unwitnessed fall found outside by a neighbor. She is unsure of what happened or how she ended up on the ground outside. Shortening and rotation of the right leg. Complaining of pain in the right hip. EXAM: CT HEAD WITHOUT CONTRAST CT CERVICAL SPINE WITHOUT CONTRAST TECHNIQUE: Multidetector CT imaging of the head and cervical spine was performed following the standard protocol without intravenous contrast. Multiplanar CT image reconstructions of the cervical spine were also generated. RADIATION DOSE REDUCTION: This exam was performed according to the departmental dose-optimization program which includes automated exposure control, adjustment of the mA and/or kV according to patient size and/or use of iterative reconstruction technique. COMPARISON:  CT head 01/19/2023, CT head 02/02/2020 FINDINGS: CT HEAD FINDINGS Brain:  Cerebral ventricle sizes are concordant with the degree of cerebral volume loss. Patchy and confluent areas of decreased attenuation are noted throughout the deep and periventricular white matter of the cerebral hemispheres bilaterally, compatible with chronic microvascular ischemic disease. No evidence of large-territorial acute infarction. No parenchymal hemorrhage. No mass lesion. No extra-axial collection. No mass effect or midline shift. No hydrocephalus. Basilar cisterns are patent. Vascular: No hyperdense vessel. Atherosclerotic calcifications are present within the cavernous internal carotid and vertebral arteries. Skull: No acute fracture or focal lesion. Sinuses/Orbits: Paranasal sinuses  and mastoid air cells are clear. The orbits are unremarkable. Other: None. CT CERVICAL SPINE FINDINGS Alignment: Normal. Skull base and vertebrae: Multilevel moderate severe degenerative changes of the spine. No severe osseous foraminal or central canal stenosis. No acute fracture. No aggressive appearing focal osseous lesion or focal pathologic process. Soft tissues and spinal canal: No prevertebral fluid or swelling. No visible canal hematoma. Upper chest: Biapical pleural/pulmonary scarring. Other: No genius thyroid  glands with 1.4 cm right thyroid  hypodense nodule-no further follow-up indicated. Atherosclerotic plaque of the carotid arteries within the neck. IMPRESSION: 1. No acute intracranial abnormality. 2. No acute displaced fracture or traumatic listhesis of the cervical spine. Electronically Signed: By: Morgane  Naveau M.D. On: 01/21/2024 20:21   CT Cervical Spine Wo Contrast Result Date: 01/21/2024 CLINICAL DATA:  CLINICAL DATA: Head trauma, minor, normal mental status (Age 51-64y) Unwitnessed fall found outside by a neighbor. She is unsure of what happened or how she ended up on the ground outside. Shortening and rotation of the right leg. Complaining of pain in the right hip. EXAM: CT HEAD WITHOUT CONTRAST CT CERVICAL SPINE WITHOUT CONTRAST TECHNIQUE: Multidetector CT imaging of the head and cervical spine was performed following the standard protocol without intravenous contrast. Multiplanar CT image reconstructions of the cervical spine were also generated. RADIATION DOSE REDUCTION: This exam was performed according to the departmental dose-optimization program which includes automated exposure control, adjustment of the mA and/or kV according to patient size and/or use of iterative reconstruction technique. COMPARISON: CT head 01/19/2023, CT head 02/02/2020 FINDINGS: CT HEAD FINDINGS Brain: Cerebral ventricle sizes are concordant with the degree of cerebral volume loss. Patchy and confluent areas  of decreased attenuation are noted throughout the deep and periventricular white matter of the cerebral hemispheres bilaterally, compatible with chronic microvascular ischemic disease. No evidence of large-territorial acute infarction. No parenchymal hemorrhage. No mass lesion. No extra-axial collection. No mass effect or midline shift. No hydrocephalus. Basilar cisterns are patent. Vascular: No hyperdense vessel. Atherosclerotic calcifications are present within the cavernous internal carotid and vertebral arteries. Skull: No acute fracture or focal lesion. Sinuses/Orbits: Paranasal sinuses and mastoid air cells are clear. The orbits are unremarkable. Other: None. CT CERVICAL SPINE FINDINGS Alignment: Normal. Skull base and vertebrae: Multilevel moderate severe degenerative changes of the spine. No severe osseous foraminal or central canal stenosis. No acute fracture. No aggressive appearing focal osseous lesion or focal pathologic process. Soft tissues and spinal canal: No prevertebral fluid or swelling. No visible canal hematoma. Upper chest: Biapical pleural/pulmonary scarring. Bilateral biapical pulmonary micronodules-no further follow-up indicated. Other: No genius thyroid  glands with 1.4 cm right thyroid  hypodense nodule-no further follow-up indicated. Atherosclerotic plaque of the carotid arteries within the neck. IMPRESSION: 1. No acute intracranial abnormality. 2. No acute displaced fracture or traumatic listhesis of the cervical spine. Electronically Signed   By: Morgane  Naveau M.D.   On: 01/21/2024 20:26   DG Chest 1 View Result Date: 01/21/2024 CLINICAL DATA:  pre op  EXAM: CHEST  1 VIEW COMPARISON:  Chest x-ray 02/04/2020 FINDINGS: Left chest 2 lead pacemaker. The heart and mediastinal contours are within normal limits. Atherosclerotic plaque. No focal consolidation. No pulmonary edema. No pleural effusion. No pneumothorax. No acute osseous abnormality. IMPRESSION: 1. No active disease. 2.  Aortic  Atherosclerosis (ICD10-I70.0). Electronically Signed   By: Morgane  Naveau M.D.   On: 01/21/2024 20:25   DG HIP UNILAT WITH PELVIS 2-3 VIEWS RIGHT Result Date: 01/21/2024 CLINICAL DATA:  [id number] Fall [id number] EXAM: DG HIP (WITH OR WITHOUT PELVIS) 2-3V RIGHT COMPARISON:  None Available. FINDINGS: Limited evaluation due to overlapping osseous structures and overlying soft tissues. Acute displaced poorly visualized right subcapital femoral neck fracture. No right hip dislocation. No left hip dislocation or acute displaced fracture. No acute displaced fracture or diastasis of the bones of the pelvis. There is no evidence of severe arthropathy or other focal bone abnormality. Densities overlying the pelvis and right proximal thigh on frontal view appear external to patient on lateral view. IMPRESSION: Acute displaced poorly visualized right subcapital femoral neck fracture. Electronically Signed   By: Morgane  Naveau M.D.   On: 01/21/2024 20:24    Scheduled Meds:  amLODipine  5 mg Oral QHS   citalopram  10 mg Oral QHS   famotidine  40 mg Oral q AM   gabapentin   200 mg Oral QHS   lamoTRIgine   50 mg Oral QHS   memantine   10 mg Oral BID   metoprolol tartrate  12.5 mg Oral QHS   And   metoprolol tartrate  25 mg Oral Daily   mupirocin ointment  1 Application Nasal BID   QUEtiapine  25 mg Oral Q24H   Continuous Infusions:   LOS: 0 days   Modena Andes, MD [facility]  01/22/2024, 10:22 AM   *Please note that this is a verbal dictation therefore any spelling or grammatical errors are due to the "[employer] One" system interpretation.  Please page via Amion and do not message via secure chat for urgent patient care matters. Secure chat can be used for non urgent patient care matters.  How to contact the [facility] Attending or Consulting provider 7A - 7P or covering provider during after hours 7P -7A, for this patient?  Check the care team in [facility] and look for a) attending/consulting [facility] provider  listed and b) the [facility] team listed. Page or secure chat 7A-7P. Log into [email or web address] and use [place]'s universal password to access. If you do not have the password, please contact the hospital operator. Locate the [facility] provider you are looking for under [facility] and page to a number that you can be directly reached. If you still have difficulty reaching the provider, please page the [facility] (Director on Call) for the Hospitalists listed on amion for assistance.

## 2024-01-23 ENCOUNTER — Encounter (HOSPITAL_COMMUNITY): Payer: Self-pay | Admitting: Orthopaedic Surgery

## 2024-01-23 DIAGNOSIS — S72011A Unspecified intracapsular fracture of right femur, initial encounter for closed fracture: Secondary | ICD-10-CM | POA: Diagnosis not present

## 2024-01-23 LAB — CBC WITH DIFFERENTIAL/PLATELET
Abs Immature Granulocytes: 0.08 10*3/uL — ABNORMAL HIGH (ref 0.00–0.07)
Basophils Absolute: 0 10*3/uL (ref 0.0–0.1)
Basophils Relative: 0 %
Eosinophils Absolute: 0 10*3/uL (ref 0.0–0.5)
Eosinophils Relative: 0 %
HCT: 33.4 % — ABNORMAL LOW (ref 36.0–46.0)
Hemoglobin: 10.8 g/dL — ABNORMAL LOW (ref 12.0–15.0)
Immature Granulocytes: 1 %
Lymphocytes Relative: 11 %
Lymphs Abs: 1.6 10*3/uL (ref 0.7–4.0)
MCH: 28.4 pg (ref 26.0–34.0)
MCHC: 32.3 g/dL (ref 30.0–36.0)
MCV: 87.9 fL (ref 80.0–100.0)
Monocytes Absolute: 0.9 10*3/uL (ref 0.1–1.0)
Monocytes Relative: 7 %
Neutro Abs: 11.3 10*3/uL — ABNORMAL HIGH (ref 1.7–7.7)
Neutrophils Relative %: 81 %
Platelets: 191 10*3/uL (ref 150–400)
RBC: 3.8 MIL/uL — ABNORMAL LOW (ref 3.87–5.11)
RDW: 13 % (ref 11.5–15.5)
WBC: 14 10*3/uL — ABNORMAL HIGH (ref 4.0–10.5)
nRBC: 0 % (ref 0.0–0.2)

## 2024-01-23 LAB — BASIC METABOLIC PANEL WITH GFR
Anion gap: 12 (ref 5–15)
BUN: 21 mg/dL (ref 8–23)
CO2: 23 mmol/L (ref 22–32)
Calcium: 8.5 mg/dL — ABNORMAL LOW (ref 8.9–10.3)
Chloride: 100 mmol/L (ref 98–111)
Creatinine, Ser: 1.41 mg/dL — ABNORMAL HIGH (ref 0.44–1.00)
GFR, Estimated: 37 mL/min — ABNORMAL LOW (ref >60)
Glucose, Bld: 108 mg/dL — ABNORMAL HIGH (ref 70–99)
Potassium: 4.1 mmol/L (ref 3.5–5.1)
Sodium: 135 mmol/L (ref 135–145)

## 2024-01-23 MED ORDER — HYDROCODONE-ACETAMINOPHEN 5-325 MG PO TABS
1.0000 | ORAL_TABLET | Freq: Four times a day (QID) | ORAL | 0 refills | Status: AC | PRN
Start: 2024-01-23 — End: ?

## 2024-01-23 MED ORDER — FAMOTIDINE 20 MG PO TABS
20.0000 mg | ORAL_TABLET | Freq: Every morning | ORAL | Status: DC
  Administered 2024-01-24 – 2024-01-25 (×2): 20 mg via ORAL
  Filled 2024-01-23 (×2): qty 1

## 2024-01-23 MED ORDER — SODIUM CHLORIDE 0.9 % IV SOLN: INTRAVENOUS | Status: AC

## 2024-01-23 MED ORDER — ENOXAPARIN SODIUM 40 MG/0.4ML IJ SOSY: 40.0000 mg | PREFILLED_SYRINGE | INTRAMUSCULAR | 0 refills | Status: AC

## 2024-01-23 MED ORDER — CHLORHEXIDINE GLUCONATE CLOTH 2 % EX PADS
6.0000 | MEDICATED_PAD | Freq: Every day | CUTANEOUS | Status: DC
  Administered 2024-01-23 – 2024-01-25 (×3): 6 via TOPICAL

## 2024-01-23 MED ORDER — ENOXAPARIN SODIUM 30 MG/0.3ML IJ SOSY
30.0000 mg | PREFILLED_SYRINGE | INTRAMUSCULAR | Status: DC
Start: 2024-01-24 — End: 2024-01-25
  Administered 2024-01-24 – 2024-01-25 (×2): 30 mg via SUBCUTANEOUS
  Filled 2024-01-23 (×2): qty 0.3

## 2024-01-23 NOTE — Progress Notes (Signed)
 PROGRESS NOTE    Erika [name]  NFA:[id number] DOB: 1941-01-03 DOA: 01/21/2024 PCP: Alvie Ax, MD (Inactive)   Brief Narrative:  Erika [name] is a 83 y.o. female with medical history significant of complete heart block status post pacemaker 2002, GERD, hypertension, hyperlipidemia, dementia, severe osteoporosis who presented to emergency department with an unwitnessed fall in her driveway.  She was brought into the ED by EMS.  On arrival she is afebrile hemodynamically stable.  X-ray of hip showed acute displaced right femoral neck fracture.  CT head showed no acute abnormalities.  Orthopedics was consulted and patient was admitted to [facility] for operative intervention in the morning.   Assessment & Plan:   Principal Problem:   Closed subcapital fracture of neck of right femur, initial encounter (HCC)  Right femoral neck fracture due to mechanical fall: Orthopedics on board, status post total hip replacement 01/22/2024.  Management per orthopedics.  Will likely require SNF discharge.  Severe dementia/cognitive impairment: Continue memantine  and lamotrigine .  Patient at baseline but at risk of hospital-acquired delirium.  Continue Seroquel. 1. Avoid benzodiazepines, antihistamines, anticholinergics, and minimize opiate use as these [place]agen worsen delirium. 2: Assess, prevent and manage pain as lack of treatment can result in delirium.  3: Provide appropriate lighting and clear signage; a clock and calendar should be easily visible to the patient. 4: Monitor environmental factors. Reduce light and noise at night (close shades, turn off lights, turn off TV, ect). Correct any alterations in sleep cycle. 5: Reorient the patient to person, place, time and situation on each encounter.  6: Correct sensory deficits if possible (replace eye glasses, hearing aids, ect). 7: Avoid restraints if able. Severely delirious patients benefit from constant observation by a sitter.  Chronic anemia: At baseline,  hemoglobin appears to be anywhere from 10.5-12.  Currently at baseline.  Monitor closely.  Mild AKI on CKD stage IIIa: Patient's baseline creatinine between 0.9-2 1.1.  Now jumped to 1.41 meeting criteria for AKI.  Will gently hydrate.  Repeat labs in the morning.  Avoid nephrotoxic agents.  GERD: Continue Pepcid.  Essential hypertension: Blood pressure low this morning.  Will hold morning dose of metoprolol.  Monitor closely.  Depression: Continue Celexa.  DVT prophylaxis: enoxaparin  (LOVENOX ) injection 40 mg Start: 01/23/24 0800 SCDs Start: 01/22/24 1619 Place TED hose Start: 01/22/24 1619 SCDs Start: 01/21/24 2210   Code Status: Limited: Do not attempt resuscitation (DNR) -DNR-LIMITED -Do Not Intubate/DNI   Family Communication: None at bedside.  Plan of care discussed with patient in length and he/she verbalized understanding and agreed with it.  Status is: Inpatient Remains inpatient appropriate because: To be seen by PT OT, will likely require SNF discharge     Estimated body mass index is 23.78 kg/m (pended) as calculated from the following:   Height as of this encounter: (P) 5\' 2"  (1.575 m).   Weight as of this encounter: (P) 59 kg.    Nutritional Assessment: Body mass index is 23.78 kg/m (pended).. Seen by dietician.  I agree with the assessment and plan as outlined below: Nutrition Status:        . Skin Assessment: I have examined the patient's skin and I agree with the wound assessment as performed by the wound care RN as outlined below:    Consultants:  Orthopedics  Procedures:  As above  Antimicrobials:  Anti-infectives (From admission, onward)    Start     Dose/Rate Route Frequency Ordered Stop   01/22/24 2000  ceFAZolin  (ANCEF )  IVPB 2g/100 mL premix        2 g 200 mL/hr over 30 Minutes Intravenous Every 6 hours 01/22/24 1618 01/23/24 1359   01/22/24 1341  vancomycin (VANCOCIN) powder  Status:  Discontinued          As needed 01/22/24 1341  01/22/24 1455   01/22/24 1100  ceFAZolin  (ANCEF ) IVPB 2g/100 mL premix        2 g 200 mL/hr over 30 Minutes Intravenous On call to O.R. 01/22/24 1055 01/22/24 1312   01/22/24 1056  ceFAZolin  (ANCEF ) 2-4 GM/100ML-% IVPB       Note to Pharmacy: Noelle Batten M: cabinet override      01/22/24 1056 01/22/24 1333         Subjective: Patient seen and examined.  She is sitting in the recliner starting to eat her breakfast.  Nurse at the bedside.  Patient has no complaints.  She is alert and oriented to self which is her baseline.  She denies any pain today.  Objective: Vitals:   01/23/24 0035 01/23/24 0409 01/23/24 0445 01/23/24 0814  BP: (!) 94/51 (!) 102/51  (!) 97/50  Pulse: 66 67  63  Resp:  16  16  Temp:  (!) 97.3 F (36.3 C)  (!) 97.4 F (36.3 C)  TempSrc:  Oral  Oral  SpO2:  (!) 89% 94% 96%  Weight:      Height:        Intake/Output Summary (Last 24 hours) at 01/23/2024 0819 Last data filed at 01/23/2024 0500 Gross per 24 hour  Intake 700 ml  Output 2250 ml  Net -1550 ml   Filed Weights   01/21/24 1825 01/22/24 1051  Weight: 59 kg (P) 59 kg    Examination:  General exam: Appears calm and comfortable  Respiratory system: Clear to auscultation. Respiratory effort normal. Cardiovascular system: S1 & S2 heard, RRR. No JVD, murmurs, rubs, gallops or clicks. No pedal edema. Gastrointestinal system: Abdomen is nondistended, soft and nontender. No organomegaly or masses felt. Normal bowel sounds heard. Central nervous system: Alert and oriented x 1. No focal neurological deficits. Skin: No rashes, lesions or ulcers.    Data Reviewed: I have personally reviewed following labs and imaging studies  CBC: Recent Labs  Lab 01/21/24 1835 01/22/24 1825 01/23/24 0635  WBC 10.9* 16.1* 14.0*  NEUTROABS 8.3*  --  11.3*  HGB 12.1 12.0 10.8*  HCT 39.8 37.2 33.4*  MCV 91.3 87.5 87.9  PLT 211 219 191   Basic Metabolic Panel: Recent Labs  Lab 01/21/24 1835 01/22/24 1825  01/23/24 0635  NA 139  --  135  K 3.5  --  4.1  CL 102  --  100  CO2 28  --  23  GLUCOSE 116*  --  108*  BUN 17  --  21  CREATININE 1.10* 0.96 1.41*  CALCIUM  9.1  --  8.5*   GFR: Estimated Creatinine Clearance: 23.5 mL/min (A) (by C-G formula based on SCr of 1.41 mg/dL (H)). Liver Function Tests: Recent Labs  Lab 01/21/24 1835  AST 18  ALT 10  ALKPHOS 79  BILITOT 0.7  PROT 6.9  ALBUMIN 3.7   No results for input(s): "LIPASE", "AMYLASE" in the last 168 hours. No results for input(s): "AMMONIA" in the last 168 hours. Coagulation Profile: No results for input(s): "INR", "PROTIME" in the last 168 hours. Cardiac Enzymes: Recent Labs  Lab 01/21/24 1835  CKTOTAL 44   BNP (last 3 results) No results for input(s): "  PROBNP" in the last 8760 hours. HbA1C: No results for input(s): "HGBA1C" in the last 72 hours. CBG: No results for input(s): "GLUCAP" in the last 168 hours. Lipid Profile: No results for input(s): "CHOL", "HDL", "LDLCALC", "TRIG", "CHOLHDL", "LDLDIRECT" in the last 72 hours. Thyroid  Function Tests: No results for input(s): "TSH", "T4TOTAL", "FREET4", "T3FREE", "THYROIDAB" in the last 72 hours. Anemia Panel: No results for input(s): "VITAMINB12", "FOLATE", "FERRITIN", "TIBC", "IRON", "RETICCTPCT" in the last 72 hours. Sepsis Labs: No results for input(s): "PROCALCITON", "LATICACIDVEN" in the last 168 hours.  Recent Results (from the past 240 hours)  Surgical PCR screen     Status: None   Collection Time: 01/22/24  1:22 AM   Specimen: Nasal Mucosa; Nasal Swab  Result Value Ref Range Status   MRSA, PCR NEGATIVE NEGATIVE Final   Staphylococcus aureus NEGATIVE NEGATIVE Final    Comment: (NOTE) The Xpert SA Assay (FDA approved for NASAL specimens in patients 27 years of age and older), is one component of a comprehensive surveillance program. It is not intended to diagnose infection nor to guide or monitor treatment. Performed at [facility], [address] [place], [place] [address]      Radiology Studies: DG HIP UNILAT WITH PELVIS 1V RIGHT Result Date: 01/22/2024 CLINICAL DATA:  Elective surgery. EXAM: DG HIP (WITH OR WITHOUT PELVIS) 1V RIGHT COMPARISON:  Preoperative imaging FINDINGS: Seven fluoroscopic spot views of the pelvis and right hip obtained in the operating room. Sequential images during hip arthroplasty. Fluoroscopy time 29 seconds. Dose 2.73 mGy. IMPRESSION: Intraoperative fluoroscopy during right hip arthroplasty. Electronically Signed   By: Chadwick Colonel M.D.   On: 01/22/2024 16:16   DG C-Arm 1-60 Min-No Report Result Date: 01/22/2024 Fluoroscopy was utilized by the requesting physician.  No radiographic interpretation.   DG C-Arm 1-60 Min-No Report Result Date: 01/22/2024 Fluoroscopy was utilized by the requesting physician.  No radiographic interpretation.   CT HEAD WO CONTRAST ( ) Addendum Date: 01/21/2024 ADDENDUM REPORT: 01/21/2024 20:26 ADDENDUM: Bilateral biapical pulmonary micronodules-no further follow-up indicated. Electronically Signed   By: Morgane  Naveau M.D.   On: 01/21/2024 20:26   Result Date: 01/21/2024 CLINICAL DATA:  Head trauma, minor, normal mental status (Age 59-64y) Unwitnessed fall found outside by a neighbor. She is unsure of what happened or how she ended up on the ground outside. Shortening and rotation of the right leg. Complaining of pain in the right hip. EXAM: CT HEAD WITHOUT CONTRAST CT CERVICAL SPINE WITHOUT CONTRAST TECHNIQUE: Multidetector CT imaging of the head and cervical spine was performed following the standard protocol without intravenous contrast. Multiplanar CT image reconstructions of the cervical spine were also generated. RADIATION DOSE REDUCTION: This exam was performed according to the departmental dose-optimization program which includes automated exposure control, adjustment of the mA and/or kV according to patient size and/or use of iterative reconstruction technique.  COMPARISON:  CT head 01/19/2023, CT head 02/02/2020 FINDINGS: CT HEAD FINDINGS Brain: Cerebral ventricle sizes are concordant with the degree of cerebral volume loss. Patchy and confluent areas of decreased attenuation are noted throughout the deep and periventricular white matter of the cerebral hemispheres bilaterally, compatible with chronic microvascular ischemic disease. No evidence of large-territorial acute infarction. No parenchymal hemorrhage. No mass lesion. No extra-axial collection. No mass effect or midline shift. No hydrocephalus. Basilar cisterns are patent. Vascular: No hyperdense vessel. Atherosclerotic calcifications are present within the cavernous internal carotid and vertebral arteries. Skull: No acute fracture or focal lesion. Sinuses/Orbits: Paranasal sinuses and mastoid air cells  are clear. The orbits are unremarkable. Other: None. CT CERVICAL SPINE FINDINGS Alignment: Normal. Skull base and vertebrae: Multilevel moderate severe degenerative changes of the spine. No severe osseous foraminal or central canal stenosis. No acute fracture. No aggressive appearing focal osseous lesion or focal pathologic process. Soft tissues and spinal canal: No prevertebral fluid or swelling. No visible canal hematoma. Upper chest: Biapical pleural/pulmonary scarring. Other: No genius thyroid  glands with 1.4 cm right thyroid  hypodense nodule-no further follow-up indicated. Atherosclerotic plaque of the carotid arteries within the neck. IMPRESSION: 1. No acute intracranial abnormality. 2. No acute displaced fracture or traumatic listhesis of the cervical spine. Electronically Signed: By: Morgane  Naveau M.D. On: 01/21/2024 20:21   CT Cervical Spine Wo Contrast Result Date: 01/21/2024 CLINICAL DATA:  CLINICAL DATA: Head trauma, minor, normal mental status (Age 38-64y) Unwitnessed fall found outside by a neighbor. She is unsure of what happened or how she ended up on the ground outside. Shortening and rotation  of the right leg. Complaining of pain in the right hip. EXAM: CT HEAD WITHOUT CONTRAST CT CERVICAL SPINE WITHOUT CONTRAST TECHNIQUE: Multidetector CT imaging of the head and cervical spine was performed following the standard protocol without intravenous contrast. Multiplanar CT image reconstructions of the cervical spine were also generated. RADIATION DOSE REDUCTION: This exam was performed according to the departmental dose-optimization program which includes automated exposure control, adjustment of the mA and/or kV according to patient size and/or use of iterative reconstruction technique. COMPARISON: CT head 01/19/2023, CT head 02/02/2020 FINDINGS: CT HEAD FINDINGS Brain: Cerebral ventricle sizes are concordant with the degree of cerebral volume loss. Patchy and confluent areas of decreased attenuation are noted throughout the deep and periventricular white matter of the cerebral hemispheres bilaterally, compatible with chronic microvascular ischemic disease. No evidence of large-territorial acute infarction. No parenchymal hemorrhage. No mass lesion. No extra-axial collection. No mass effect or midline shift. No hydrocephalus. Basilar cisterns are patent. Vascular: No hyperdense vessel. Atherosclerotic calcifications are present within the cavernous internal carotid and vertebral arteries. Skull: No acute fracture or focal lesion. Sinuses/Orbits: Paranasal sinuses and mastoid air cells are clear. The orbits are unremarkable. Other: None. CT CERVICAL SPINE FINDINGS Alignment: Normal. Skull base and vertebrae: Multilevel moderate severe degenerative changes of the spine. No severe osseous foraminal or central canal stenosis. No acute fracture. No aggressive appearing focal osseous lesion or focal pathologic process. Soft tissues and spinal canal: No prevertebral fluid or swelling. No visible canal hematoma. Upper chest: Biapical pleural/pulmonary scarring. Bilateral biapical pulmonary micronodules-no further  follow-up indicated. Other: No genius thyroid  glands with 1.4 cm right thyroid  hypodense nodule-no further follow-up indicated. Atherosclerotic plaque of the carotid arteries within the neck. IMPRESSION: 1. No acute intracranial abnormality. 2. No acute displaced fracture or traumatic listhesis of the cervical spine. Electronically Signed   By: Morgane  Naveau M.D.   On: 01/21/2024 20:26   DG Chest 1 View Result Date: 01/21/2024 CLINICAL DATA:  pre op EXAM: CHEST  1 VIEW COMPARISON:  Chest x-ray 02/04/2020 FINDINGS: Left chest 2 lead pacemaker. The heart and mediastinal contours are within normal limits. Atherosclerotic plaque. No focal consolidation. No pulmonary edema. No pleural effusion. No pneumothorax. No acute osseous abnormality. IMPRESSION: 1. No active disease. 2.  Aortic Atherosclerosis (ICD10-I70.0). Electronically Signed   By: Morgane  Naveau M.D.   On: 01/21/2024 20:25   DG HIP UNILAT WITH PELVIS 2-3 VIEWS RIGHT Result Date: 01/21/2024 CLINICAL DATA:  [id number] Fall [id number] EXAM: DG HIP (WITH OR WITHOUT PELVIS) 2-3V RIGHT COMPARISON:  None Available. FINDINGS: Limited evaluation due to overlapping osseous structures and overlying soft tissues. Acute displaced poorly visualized right subcapital femoral neck fracture. No right hip dislocation. No left hip dislocation or acute displaced fracture. No acute displaced fracture or diastasis of the bones of the pelvis. There is no evidence of severe arthropathy or other focal bone abnormality. Densities overlying the pelvis and right proximal thigh on frontal view appear external to patient on lateral view. IMPRESSION: Acute displaced poorly visualized right subcapital femoral neck fracture. Electronically Signed   By: Morgane  Naveau M.D.   On: 01/21/2024 20:24    Scheduled Meds:  acetaminophen   1,000 mg Oral Q6H   amLODipine  5 mg Oral QHS   Chlorhexidine  Gluconate Cloth  6 each Topical Daily   citalopram  10 mg Oral QHS   docusate sodium  100 mg  Oral BID   enoxaparin  (LOVENOX ) injection  40 mg Subcutaneous Q24H   famotidine  40 mg Oral q AM   gabapentin   200 mg Oral QHS   lamoTRIgine   50 mg Oral QHS   memantine   10 mg Oral BID   metoprolol tartrate  12.5 mg Oral QHS   And   metoprolol tartrate  25 mg Oral Daily   mupirocin ointment  1 Application Nasal BID   QUEtiapine  25 mg Oral Q24H   Continuous Infusions:   ceFAZolin  (ANCEF ) IV 2 g (01/23/24 0813)     LOS: 1 day   Modena Andes, MD [facility]  01/23/2024, 8:19 AM   *Please note that this is a verbal dictation therefore any spelling or grammatical errors are due to the "Dragon [employer] One" system interpretation.  Please page via Amion and do not message via secure chat for urgent patient care matters. Secure chat can be used for non urgent patient care matters.  How to contact the [facility] Attending or Consulting provider 7A - 7P or covering provider during after hours 7P -7A, for this patient?  Check the care team in [facility] and look for a) attending/consulting [facility] provider listed and b) the [facility] team listed. Page or secure chat 7A-7P. Log into [email or web address] and use [place]'s universal password to access. If you do not have the password, please contact the hospital operator. Locate the [facility] provider you are looking for under [facility] and page to a number that you can be directly reached. If you still have difficulty reaching the provider, please page the [facility] (Director on Call) for the Hospitalists listed on amion for assistance.

## 2024-01-23 NOTE — Evaluation (Addendum)
 Occupational Therapy Evaluation Patient Details Name: Erika [name] MRN: [id number] DOB: 04-Jul-1941 Today's Date: 01/23/2024   History of Present Illness   Pt is a 83 y/o female presenting on 4/27 after fall.  Found with acute displaced R femoral neck fx. 4/28 S/P anterior approach THA. PMH includes: complete heart block s/p pacemaker 2002, HTN, dementia, severe osteoporosis, OSA, PSVT.     Clinical Impressions Patient admitted for above and presents with problem list below.  Per chart, pt lives with her daughter and was ambulating.  Anticipate she needed assist for ADLs and IADLs.  Currently requires total assist +2 for bed mobility, mod assist +2 for to stand and step to recliner, and min to total assist +2 for ADLS.  She is oriented to self only, but known hx of dementia; she is able to report discomfort in R hip but unsure why it hurts.  Constant re-orientation to situation with poor attention to task.  Based on performance today, believe patient will best benefit from continued OT services acutely and after dc at inpatient setting with <3hrs/day to optimize independence, safety with ADLs and mobility.      If plan is discharge home, recommend the following:   Two people to help with walking and/or transfers;A lot of help with bathing/dressing/bathroom;Supervision due to cognitive status;Direct supervision/assist for financial management;Direct supervision/assist for medications management;Assistance with cooking/housework;Assist for transportation     Functional Status Assessment   Patient has had a recent decline in their functional status and demonstrates the ability to make significant improvements in function in a reasonable and predictable amount of time.     Equipment Recommendations   BSC/3in1;Other (comment);Wheelchair (measurements OT);Wheelchair cushion (measurements OT) (RW)     Recommendations for Other Services         Precautions/Restrictions    Precautions Precautions: Fall Recall of Precautions/Restrictions: Impaired Restrictions Weight Bearing Restrictions Per Provider Order: Yes RLE Weight Bearing Per Provider Order: Weight bearing as tolerated     Mobility Bed Mobility Overal bed mobility: Needs Assistance Bed Mobility: Supine to Sit     Supine to sit: Total assist, +2 for physical assistance, Used rails, HOB elevated     General bed mobility comments: pt resistive at times, pt able to assist with L LE and reaching with R UE but increased assist to assist R LE, raise trunk and scoot forward.    Transfers Overall transfer level: Needs assistance Equipment used: 2 person hand held assist Transfers: Sit to/from Stand, Bed to chair/wheelchair/BSC Sit to Stand: Mod assist, +2 physical assistance Stand pivot transfers: Mod assist, +2 physical assistance         General transfer comment: bil hand held support to power up and step to recliner with incraesed time      Balance Overall balance assessment: Needs assistance Sitting-balance support: No upper extremity supported, Feet supported Sitting balance-Leahy Scale: Fair Sitting balance - Comments: statically close supervision   Standing balance support: Bilateral upper extremity supported, During functional activity Standing balance-Leahy Scale: Poor Standing balance comment: relies on BUE and external support                           ADL either performed or assessed with clinical judgement   ADL Overall ADL's : Needs assistance/impaired     Grooming: Minimal assistance;Sitting           Upper Body Dressing : Moderate assistance;Sitting   Lower Body Dressing: Total assistance;Sit to/from stand;+2 for physical  assistance   Toilet Transfer: Moderate assistance;+2 for physical assistance;Stand-pivot Toilet Transfer Details (indicate cue type and reason): to recliner                 Vision   Vision Assessment?: No apparent visual  deficits     Perception         Praxis         Pertinent Vitals/Pain Pain Assessment Pain Assessment: Faces Faces Pain Scale: Hurts even more Pain Location: R hip Pain Descriptors / Indicators: Discomfort, Operative site guarding Pain Intervention(s): Limited activity within patient's tolerance, Monitored during session, Repositioned     Extremity/Trunk Assessment Upper Extremity Assessment Upper Extremity Assessment: Generalized weakness   Lower Extremity Assessment Lower Extremity Assessment: Defer to PT evaluation (R hip sx)       Communication Communication Communication: Impaired Factors Affecting Communication: Difficulty expressing self   Cognition Arousal: Alert Behavior During Therapy: WFL for tasks assessed/performed Cognition: History of cognitive impairments             OT - Cognition Comments: pt oriented to self, follows simple commands with increased time and redirection.  Easily distracted and non sensical speech at times.  Voicing seeing family members in room as well. pleasantly confused.                 Following commands: Impaired Following commands impaired: Follows one step commands inconsistently, Follows one step commands with increased time     Cueing  General Comments   Cueing Techniques: Verbal cues;Visual cues  BP soft but stable   Exercises     Shoulder Instructions      Home Living Family/patient expects to be discharged to:: Unsure                                 Additional Comments: no family present, pt unable to provide hx. per chart lives with daughter      Prior Functioning/Environment Prior Level of Function : Patient poor historian/Family not available             Mobility Comments: per chart ambulating ADLs Comments: pt unable to report, anticipate needing some assist due to hx of dementia    OT Problem List: Decreased strength;Decreased activity tolerance;Impaired balance (sitting  and/or standing);Pain;Decreased knowledge of precautions;Decreased knowledge of use of DME or AE   OT Treatment/Interventions: Self-care/ADL training;Therapeutic exercise;DME and/or AE instruction;Therapeutic activities;Patient/family education;Balance training      OT Goals(Current goals can be found in the care plan section)   Acute Rehab OT Goals Patient Stated Goal: none stated OT Goal Formulation: Patient unable to participate in goal setting Time For Goal Achievement: 02/06/24 Potential to Achieve Goals: Fair ADL Goals Pt Will Perform Grooming: with set-up;sitting Pt Will Transfer to Toilet: with min assist;bedside commode Additional ADL Goal #1: Pt will follow 1 step commands with 90% accuracy. Additional ADL Goal #2: Pt will complete bed mobility with mod assist and maintain sitting balance dynamically with supervision for 5 minutes.   OT Frequency:  Min 2X/week    Co-evaluation PT/OT/SLP Co-Evaluation/Treatment: Yes Reason for Co-Treatment: Necessary to address cognition/behavior during functional activity;For patient/therapist safety;To address functional/ADL transfers PT goals addressed during session: Mobility/safety with mobility OT goals addressed during session: ADL's and self-care      AM-PAC OT "6 Clicks" Daily Activity     Outcome Measure Help from another person eating meals?: A Little Help from another person taking care  of personal grooming?: A Little Help from another person toileting, which includes using toliet, bedpan, or urinal?: Total Help from another person bathing (including washing, rinsing, drying)?: A Lot Help from another person to put on and taking off regular upper body clothing?: A Lot Help from another person to put on and taking off regular lower body clothing?: Total 6 Click Score: 12   End of Session Equipment Utilized During Treatment: Gait belt;Oxygen (2L) Nurse Communication: Mobility status;Precautions;Weight bearing  status  Activity Tolerance: Patient tolerated treatment well Patient left: in chair;with call bell/phone within reach;with chair alarm set  OT Visit Diagnosis: Other abnormalities of gait and mobility (R26.89);Muscle weakness (generalized) (M62.81);Pain Pain - Right/Left: Right Pain - part of body: Hip;Leg                Time: 4132-4401 OT Time Calculation (min): 30 min Charges:  OT General Charges $OT Visit: 1 Visit OT Evaluation $OT Eval Moderate Complexity: 1 Mod  Erika [name], OT Acute Rehabilitation Services Office [phone] Secure Chat Preferred    Erika [name] 01/23/2024, 1:36 PM

## 2024-01-23 NOTE — Evaluation (Signed)
 Physical Therapy Evaluation Patient Details Name: Erika [name] MRN: [id number] DOB: 27-Mar-1941 Today's Date: 01/23/2024  History of Present Illness  Pt is a 83 y/o female presenting on 4/27 after fall.  Found with acute displaced R femoral neck fx. 4/28 S/P anterior approach THA. PMH includes: complete heart block s/p pacemaker 2002, HTN, dementia, severe osteoporosis, OSA, PSVT.   Clinical Impression  Pt admitted with above. Per chart pt only oriented to self at baseline and lives with daughter and spouse however no spouse listed in contacts. Pt with known dementia however also very confused and hallucinating stating her father was "right over there" amongst others people in her life. Pt very resistant to mobilizing, suspect due to pain. Pt with no comprehension or recall of situation despite frequent re-orientation. Pt requiring modAx2 for mobility and would benefit from inpatient rehab program < 3 hrs a day to achieve safe supervision level of function prior to return home with family. Acute PT to cont to follow.        If plan is discharge home, recommend the following: A lot of help with walking and/or transfers;A lot of help with bathing/dressing/bathroom;Supervision due to cognitive status;Help with stairs or ramp for entrance;Assist for transportation   Can travel by private vehicle   Yes    Equipment Recommendations Rolling walker (2 wheels) (TBD at next venue)  Recommendations for Other Services       Functional Status Assessment Patient has had a recent decline in their functional status and demonstrates the ability to make significant improvements in function in a reasonable and predictable amount of time.     Precautions / Restrictions Precautions Precautions: Fall Recall of Precautions/Restrictions: Impaired Restrictions Weight Bearing Restrictions Per Provider Order: Yes RLE Weight Bearing Per Provider Order: Weight bearing as tolerated      Mobility  Bed  Mobility Overal bed mobility: Needs Assistance Bed Mobility: Supine to Sit     Supine to sit: Total assist, +2 for physical assistance, Used rails, HOB elevated     General bed mobility comments: pt resistive at times, pt able to assist with L LE and reaching with R UE but increased assist to assist R LE, raise trunk and scoot forward. once sitting for a minute pt able to relax and maintain static sitting balance with close contact guard    Transfers Overall transfer level: Needs assistance Equipment used: 2 person hand held assist Transfers: Sit to/from Stand, Bed to chair/wheelchair/BSC Sit to Stand: Mod assist, +2 physical assistance   Step pivot transfers: Mod assist, +2 physical assistance       General transfer comment: bil hand held support to power up and step to recliner with incraesed time, verbal cues for sequencing    Ambulation/Gait               General Gait Details: limited to transfer to chair this date due to impaired cognition and pts limited WBing tolerance  Stairs            Wheelchair Mobility     Tilt Bed    Modified Rankin (Stroke Patients Only)       Balance Overall balance assessment: Needs assistance Sitting-balance support: No upper extremity supported, Feet supported Sitting balance-Leahy Scale: Fair Sitting balance - Comments: statically close supervision   Standing balance support: Bilateral upper extremity supported, During functional activity Standing balance-Leahy Scale: Poor Standing balance comment: relies on BUE and external support  Pertinent Vitals/Pain Pain Assessment Pain Assessment: Faces Faces Pain Scale: Hurts even more Pain Location: R hip Pain Descriptors / Indicators: Discomfort, Operative site guarding Pain Intervention(s): Monitored during session    Home Living Family/patient expects to be discharged to:: Unsure                   Additional Comments:  no family present, pt unable to provide hx. per chart lives with daughter and spouse however no spouse listed in contacts    Prior Function Prior Level of Function : Patient poor historian/Family not available             Mobility Comments: per chart ambulating ADLs Comments: pt unable to report, anticipate needing some assist due to hx of dementia     Extremity/Trunk Assessment   Upper Extremity Assessment Upper Extremity Assessment: Defer to OT evaluation    Lower Extremity Assessment Lower Extremity Assessment: RLE deficits/detail RLE Deficits / Details: minimal active movement, pt repeatedly rubbing R thigh stating "this is what they did to me"    Cervical / Trunk Assessment Cervical / Trunk Assessment: Normal  Communication   Communication Communication: Impaired Factors Affecting Communication: Difficulty expressing self    Cognition Arousal: Alert Behavior During Therapy: WFL for tasks assessed/performed                           PT - Cognition Comments: pt with known history of dementia. per chart pt only oriented to self at baseline. Pt also confused and hallucinating seeing her "father" in the corner of the room Following commands: Impaired Following commands impaired: Follows one step commands inconsistently, Follows one step commands with increased time     Cueing Cueing Techniques: Verbal cues, Visual cues     General Comments General comments (skin integrity, edema, etc.): BP soft but stable, 98/64, 102/73    Exercises     Assessment/Plan    PT Assessment Patient needs continued PT services  PT Problem List Decreased strength;Decreased activity tolerance;Decreased balance;Decreased mobility;Decreased coordination;Decreased cognition;Decreased knowledge of use of DME       PT Treatment Interventions DME instruction;Gait training;Functional mobility training;Therapeutic activities;Therapeutic exercise;Cognitive remediation    PT Goals  (Current goals can be found in the Care Plan section)  Acute Rehab PT Goals Patient Stated Goal: didn't state PT Goal Formulation: Patient unable to participate in goal setting Time For Goal Achievement: 02/06/24 Potential to Achieve Goals: Good    Frequency Min 2X/week     Co-evaluation PT/OT/SLP Co-Evaluation/Treatment: Yes Reason for Co-Treatment: Necessary to address cognition/behavior during functional activity;For patient/therapist safety;To address functional/ADL transfers PT goals addressed during session: Mobility/safety with mobility OT goals addressed during session: ADL's and self-care       AM-PAC PT "6 Clicks" Mobility  Outcome Measure Help needed turning from your back to your side while in a flat bed without using bedrails?: A Lot Help needed moving from lying on your back to sitting on the side of a flat bed without using bedrails?: A Lot Help needed moving to and from a bed to a chair (including a wheelchair)?: A Lot Help needed standing up from a chair using your arms (e.g., wheelchair or bedside chair)?: A Lot Help needed to walk in hospital room?: A Lot Help needed climbing 3-5 steps with a railing? : Total 6 Click Score: 11    End of Session Equipment Utilized During Treatment: Gait belt;Oxygen Activity Tolerance: Patient limited by pain Patient left: in  chair;with call bell/phone within reach;with chair alarm set Nurse Communication: Mobility status PT Visit Diagnosis: Unsteadiness on feet (R26.81);Difficulty in walking, not elsewhere classified (R26.2)    Time: 4098-1191 PT Time Calculation (min) (ACUTE ONLY): 31 min   Charges:   PT Evaluation $PT Eval Moderate Complexity: 1 Mod   PT General Charges $$ ACUTE PT VISIT: 1 Visit         Renaee Caro, PT, DPT Acute Rehabilitation Services Secure chat preferred Office #: [phone]   Jenna Moan 01/23/2024, 3:04 PM

## 2024-01-23 NOTE — NC FL2 (Signed)
 [place]  MEDICAID FL2 LEVEL OF CARE FORM     IDENTIFICATION  Patient Name: Erika [name] Birthdate: 05-11-1941 Sex: female Admission Date (Current Location): 01/21/2024  [facility] and [place] Number:  Producer, television/film/video and Address:  [facility], [address] [place], [place] [address]      Provider Number: [id number]  Attending Physician Name and Address:  Modena Andes, MD  Relative Name and Phone Number:  [facility] Daughter [phone]  [phone]    Current Level of Care: Hospital Recommended Level of Care: Skilled Nursing Facility Prior Approval Number:    Date Approved/Denied:   PASRR Number: [id number]  Discharge Plan: SNF    Current Diagnoses: Patient Active Problem List   Diagnosis Date Noted   Closed subcapital fracture of neck of right femur, initial encounter (HCC) 01/22/2024   Late onset Alzheimer's dementia without behavioral disturbance, psychotic disturbance, mood disturbance, or anxiety (HCC) 07/29/2021   S/P placement of cardiac pacemaker 07/29/2021   Hypertension 08/13/2020   Pacemaker 07/27/2020   Complete heart block (HCC) 02/02/2020   Spells of decreased attentiveness 01/23/2020   Dementia without behavioral disturbance (HCC) 01/23/2020   Hiatal hernia with obstruction but no gangrene 10/29/2018   Exertional dyspnea    Chest pain    Second degree AV block, Mobitz type II 09/10/2018   Pre-operative cardiovascular examination 09/10/2018   Neck pain on right side 05/13/2013   Right shoulder pain 04/23/2013   Hyperlipidemia 08/28/2012   Irritable bowel syndrome 08/28/2012    Orientation RESPIRATION BLADDER Height & Weight     Self  Normal Continent, Indwelling catheter Weight: (P) 130 lb (59 kg) Height:  (P) 5\' 2"  (157.5 cm)  BEHAVIORAL SYMPTOMS/MOOD NEUROLOGICAL BOWEL NUTRITION STATUS      Continent Diet (see discharge summary)  AMBULATORY STATUS COMMUNICATION OF NEEDS Skin   Extensive Assist Verbally  Surgical wounds, Skin abrasions                       Personal Care Assistance Level of Assistance  Bathing, Feeding, Dressing, Total care Bathing Assistance: Maximum assistance Feeding assistance: Limited assistance Dressing Assistance: Maximum assistance Total Care Assistance: Maximum assistance   Functional Limitations Info  Sight, Hearing, Speech Sight Info: Adequate Hearing Info: Impaired Speech Info: Adequate    SPECIAL CARE FACTORS FREQUENCY  PT (By licensed PT), OT (By licensed OT)     PT Frequency: 5x week OT Frequency: 5x week            Contractures Contractures Info: Not present    Additional Factors Info  Code Status, Allergies Code Status Info: DNR Allergies Info: Donepezil, Xanax  (Alprazolam )           Current Medications (01/23/2024):  This is the current hospital active medication list Current Facility-Administered Medications  Medication Dose Route Frequency Provider Last Rate Last Admin   0.9 %  sodium chloride  infusion   Intravenous Continuous Modena Andes, MD 100 mL/hr at 01/23/24 0920 New Bag at 01/23/24 0920   acetaminophen  (TYLENOL ) tablet 650 mg  650 mg Oral Q6H PRN Wes Hamman, MD   650 mg at 01/21/24 2254   Or   acetaminophen  (TYLENOL ) suppository 650 mg  650 mg Rectal Q6H PRN Wes Hamman, MD       acetaminophen  (TYLENOL ) tablet 1,000 mg  1,000 mg Oral Q6H Wes Hamman, MD   1,000 mg at 01/23/24 1226   acetaminophen  (TYLENOL ) tablet 325-650 mg  325-650 mg Oral Q6H  PRN Wes Hamman, MD       alum & mag hydroxide-simeth (MAALOX/MYLANTA) 200-200-20 MG/5ML suspension 30 mL  30 mL Oral Q4H PRN Wes Hamman, MD       amLODipine (NORVASC) tablet 5 mg  5 mg Oral QHS Wes Hamman, MD   5 mg at 01/21/24 2254   Chlorhexidine  Gluconate Cloth 2 % PADS 6 each  6 each Topical Daily Modena Andes, MD   6 each at 01/23/24 0820   citalopram (CELEXA) tablet 10 mg  10 mg Oral QHS Wes Hamman, MD   10 mg at 01/22/24 2048   docusate sodium  (COLACE) capsule 100 mg  100 mg Oral BID Wes Hamman, MD   100 mg at 01/23/24 0813   [START ON 01/24/2024] enoxaparin  (LOVENOX ) injection 30 mg  30 mg Subcutaneous Q24H Dang, Thuy D, [facility]       [START ON 01/24/2024] famotidine (PEPCID) tablet 20 mg  20 mg Oral q AM Dang, Thuy D, [facility]       gabapentin  (NEURONTIN ) capsule 200 mg  200 mg Oral QHS Wes Hamman, MD   200 mg at 01/22/24 2044   HYDROmorphone  (DILAUDID ) injection 0.5-1 mg  0.5-1 mg Intravenous Q4H PRN Wes Hamman, MD       lamoTRIgine  (LAMICTAL ) tablet 50 mg  50 mg Oral QHS Wes Hamman, MD   50 mg at 01/22/24 2049   magnesium citrate solution 1 Bottle  1 Bottle Oral Once PRN Wes Hamman, MD       memantine  (NAMENDA ) tablet 10 mg  10 mg Oral BID Wes Hamman, MD   10 mg at 01/23/24 0981   menthol-cetylpyridinium (CEPACOL) lozenge 3 mg  1 lozenge Oral PRN Wes Hamman, MD       Or   phenol (CHLORASEPTIC) mouth spray 1 spray  1 spray Mouth/Throat PRN Wes Hamman, MD       methocarbamol (ROBAXIN) tablet 500 mg  500 mg Oral Q6H PRN Wes Hamman, MD   500 mg at 01/23/24 1229   Or   methocarbamol (ROBAXIN) injection 500 mg  500 mg Intravenous Q6H PRN Wes Hamman, MD       metoprolol tartrate (LOPRESSOR) tablet 12.5 mg  12.5 mg Oral QHS Wes Hamman, MD   12.5 mg at 01/21/24 2254   And   metoprolol tartrate (LOPRESSOR) tablet 25 mg  25 mg Oral Daily Wes Hamman, MD   25 mg at 01/22/24 0950   mupirocin ointment (BACTROBAN) 2 % 1 Application  1 Application Nasal BID Wes Hamman, MD   1 Application at 01/23/24 0815   ondansetron  (ZOFRAN ) tablet 4 mg  4 mg Oral Q6H PRN Wes Hamman, MD       Or   ondansetron  (ZOFRAN ) injection 4 mg  4 mg Intravenous Q6H PRN Wes Hamman, MD       oxyCODONE  (Oxy IR/ROXICODONE ) immediate release tablet 10-15 mg  10-15 mg Oral Q4H PRN Wes Hamman, MD       oxyCODONE  (Oxy IR/ROXICODONE ) immediate release tablet 5-10 mg  5-10 mg Oral Q4H PRN Wes Hamman, MD       polyethylene glycol (MIRALAX  /  GLYCOLAX ) packet 17 g  17 g Oral Daily PRN Wes Hamman, MD       QUEtiapine (SEROQUEL) tablet 25 mg  25 mg Oral Q24H Wes Hamman, MD   25 mg at 01/22/24 2044  sorbitol 70 % solution 30 mL  30 mL Oral Daily PRN Wes Hamman, MD         Discharge Medications: Please see discharge summary for a list of discharge medications.  Relevant Imaging Results:  Relevant Lab Results:   Additional Information SSN: [id number]  Elspeth Hals, LCSW

## 2024-01-23 NOTE — Progress Notes (Signed)
 Subjective: 1 Day Post-Op Procedure(s) (LRB): ARTHROPLASTY, HIP, TOTAL, ANTERIOR APPROACH (Right) Patient pleasantly demented and resting comfortably  Objective: Vital signs in last 24 hours: Temp:  [97.3 F (36.3 C)-98.7 F (37.1 C)] 97.3 F (36.3 C) (04/29 0409) Pulse Rate:  [60-69] 67 (04/29 0409) Resp:  [11-17] 16 (04/29 0409) BP: (84-146)/(49-76) 102/51 (04/29 0409) SpO2:  [89 %-99 %] 94 % (04/29 0445) Weight:  [59 kg] (P) 59 kg (04/28 1051)  Intake/Output from previous day: 04/28 0701 - 04/29 0700 In: 700 [I.V.:600; IV Piggyback:100] Out: 2250 [Urine:2200; Blood:50] Intake/Output this shift: No intake/output data recorded.  Recent Labs    01/21/24 1835 01/22/24 1825 01/23/24 0635  HGB 12.1 12.0 10.8*   Recent Labs    01/22/24 1825 01/23/24 0635  WBC 16.1* 14.0*  RBC 4.25 3.80*  HCT 37.2 33.4*  PLT 219 191   Recent Labs    01/21/24 1835 01/22/24 1825 01/23/24 0635  NA 139  --  135  K 3.5  --  4.1  CL 102  --  100  CO2 28  --  23  BUN 17  --  21  CREATININE 1.10* 0.96 1.41*  GLUCOSE 116*  --  108*  CALCIUM  9.1  --  8.5*   No results for input(s): "LABPT", "INR" in the last 72 hours.  Intact pulses distally Dorsiflexion/Plantar flexion intact Incision: dressing C/D/I No cellulitis present Compartment soft   Assessment/Plan: 1 Day Post-Op Procedure(s) (LRB): ARTHROPLASTY, HIP, TOTAL, ANTERIOR APPROACH (Right) Up with therapy WBAT RLE ABLA- mild and stable Dvt ppx- lovenox /scds Norco for pain at d/c D/c dispo per primary team      Sandie Cross 01/23/2024, 7:39 AM

## 2024-01-24 DIAGNOSIS — S72011A Unspecified intracapsular fracture of right femur, initial encounter for closed fracture: Secondary | ICD-10-CM | POA: Diagnosis not present

## 2024-01-24 LAB — CBC
HCT: 31.8 % — ABNORMAL LOW (ref 36.0–46.0)
Hemoglobin: 10.2 g/dL — ABNORMAL LOW (ref 12.0–15.0)
MCH: 28.6 pg (ref 26.0–34.0)
MCHC: 32.1 g/dL (ref 30.0–36.0)
MCV: 89.1 fL (ref 80.0–100.0)
Platelets: 185 10*3/uL (ref 150–400)
RBC: 3.57 MIL/uL — ABNORMAL LOW (ref 3.87–5.11)
RDW: 13.4 % (ref 11.5–15.5)
WBC: 12.5 10*3/uL — ABNORMAL HIGH (ref 4.0–10.5)
nRBC: 0 % (ref 0.0–0.2)

## 2024-01-24 LAB — BASIC METABOLIC PANEL WITH GFR
Anion gap: 10 (ref 5–15)
BUN: 21 mg/dL (ref 8–23)
CO2: 23 mmol/L (ref 22–32)
Calcium: 8.5 mg/dL — ABNORMAL LOW (ref 8.9–10.3)
Chloride: 104 mmol/L (ref 98–111)
Creatinine, Ser: 0.98 mg/dL (ref 0.44–1.00)
GFR, Estimated: 57 mL/min — ABNORMAL LOW (ref >60)
Glucose, Bld: 87 mg/dL (ref 70–99)
Potassium: 3.9 mmol/L (ref 3.5–5.1)
Sodium: 137 mmol/L (ref 135–145)

## 2024-01-24 NOTE — Progress Notes (Signed)
 Subjective: 2 Days Post-Op Procedure(s) (LRB): ARTHROPLASTY, HIP, TOTAL, ANTERIOR APPROACH (Right) Patient is pleasantly demented and resting comfortably.    Objective: Vital signs in last 24 hours: Temp:  [97.4 F (36.3 C)-98.8 F (37.1 C)] 98.8 F (37.1 C) (04/30 0745) Pulse Rate:  [63-73] 70 (04/30 0745) Resp:  [16-19] 19 (04/30 0745) BP: (97-141)/(46-84) 129/65 (04/30 0745) SpO2:  [91 %-100 %] 91 % (04/30 0745)  Intake/Output from previous day: 04/29 0701 - 04/30 0700 In: 1142.5 [P.O.:480; I.V.:662.5] Out: 400 [Urine:400] Intake/Output this shift: No intake/output data recorded.  Recent Labs    01/21/24 1835 01/22/24 1825 01/23/24 0635  HGB 12.1 12.0 10.8*   Recent Labs    01/22/24 1825 01/23/24 0635  WBC 16.1* 14.0*  RBC 4.25 3.80*  HCT 37.2 33.4*  PLT 219 191   Recent Labs    01/21/24 1835 01/22/24 1825 01/23/24 0635  NA 139  --  135  K 3.5  --  4.1  CL 102  --  100  CO2 28  --  23  BUN 17  --  21  CREATININE 1.10* 0.96 1.41*  GLUCOSE 116*  --  108*  CALCIUM  9.1  --  8.5*   No results for input(s): "LABPT", "INR" in the last 72 hours.  Intact pulses distally Incision: dressing C/D/I No cellulitis present Compartment soft   Assessment/Plan: 2 Days Post-Op Procedure(s) (LRB): ARTHROPLASTY, HIP, TOTAL, ANTERIOR APPROACH (Right) Up with therapy Weightbearing: WBAT RLE Insicional and dressing care: Dressings left intact until follow-up Orthopedic device(s): None Showering: pod #3 with aquacel VTE prophylaxis: Lovenox  40mg  qd  x 2 weeks Pain control: norco Follow - up plan: 2 weeks Contact information:  xu MD, Flint Hummer PA      Erika [name] 01/24/2024, 7:56 AM

## 2024-01-24 NOTE — TOC Progression Note (Signed)
 Transition of Care [facility]) - Progression Note    Patient Details  Name: Erika [name] MRN: [id number] Date of Birth: 1941/01/26  [facility] CM/SW Contact  Elspeth Hals, LCSW Phone Number: 01/24/2024, 10:50 AM  Clinical Narrative:   Pt oriented x1-all information from daughter Erika [name].  Husband also present but did not participate.  Pt and husband live alone, daughter lives a few doors down and is providing care throughout the day.  No current professional services.  Discussed PT recommendation for SNF and Erika [name] is in agreement, requesting [facility].  Permission given to send out referral in hub.  Medicare choice document also provided.  Referral sent out in hub to [facility], CSW reached out to [place] to review.      Expected Discharge Plan: Skilled Nursing Facility Barriers to Discharge: Continued Medical Work up, SNF Pending bed offer  Expected Discharge Plan and Services In-house Referral: Clinical Social Work   Post Acute Care Choice: Skilled Nursing Facility Living arrangements for the past 2 months: Single Family Home                                       Social Determinants of Health (SDOH) Interventions SDOH Screenings   Food Insecurity: Patient Unable To Answer (01/23/2024)  Housing: Patient Unable To Answer (01/23/2024)  Transportation Needs: Patient Unable To Answer (01/23/2024)  Utilities: Patient Unable To Answer (01/23/2024)  Social Connections: Unknown (01/23/2024)  Tobacco Use: Low Risk  (01/22/2024)    Readmission Risk Interventions     No data to display

## 2024-01-24 NOTE — TOC Progression Note (Addendum)
 Transition of Care [facility]) - Progression Note    Patient Details  Name: Terianna Dizdarevic Mark MRN: [id number] Date of Birth: 12-05-1940  Transition of Care [facility]) CM/SW Contact  Elspeth Hals, LCSW Phone Number: 01/24/2024, 1:18 PM  Clinical Narrative:   Message with [facility] regarding pt admitting under medicare waiver.  She needs verification that pt is eligible.  Message sent to [facility] email.  1215: message from [place]: pt is eligible for waiver.  1300: CSW spoke with [facility]: Informed that pt is eligible for waiver, she has to discuss with her admin before can accept pt today.  CSW spoke with daughter [name]: she is not in agreement with DC today, was told DC Friday.  MD informed.   1330: TC [facility].  They are not able to accept pt.  Daughter informed, agreeable to referral being sent to other SNF, asking for [facility], [facility], [facility].  Referral sent and CSW reached out to those facilities.  [facility]: no beds available.      Expected Discharge Plan: Skilled Nursing Facility Barriers to Discharge: Continued Medical Work up, SNF Pending bed offer  Expected Discharge Plan and Services In-house Referral: Clinical Social Work   Post Acute Care Choice: Skilled Nursing Facility Living arrangements for the past 2 months: Single Family Home Expected Discharge Date: 01/24/24                                     Social Determinants of Health (SDOH) Interventions SDOH Screenings   Food Insecurity: Patient Unable To Answer (01/23/2024)  Housing: Patient Unable To Answer (01/23/2024)  Transportation Needs: Patient Unable To Answer (01/23/2024)  Utilities: Patient Unable To Answer (01/23/2024)  Social Connections: Unknown (01/23/2024)  Tobacco Use: Low Risk  (01/22/2024)    Readmission Risk Interventions     No data to display

## 2024-01-24 NOTE — Progress Notes (Signed)
 Patient was unable to be oriented while attempting to sit on the [facility].  Bladder scan revealed 388 ml in bladder.  MD advised.  MD asked to not in/out at this time.  To re-bladder scan at 1230 and if still unable to void then attempt an in/out.

## 2024-01-24 NOTE — Discharge Summary (Signed)
 Physician Discharge Summary  Erika [name] VZD:[id number] DOB: 1941-09-07 DOA: 01/21/2024  PCP: Tena Feeling, MD  Admit date: 01/21/2024 Discharge date: 01/24/2024 30 Day Unplanned Readmission Risk Score    Flowsheet Row ED to Hosp-Admission (Current) from 01/21/2024 in [facility]  30 Day Unplanned Readmission Risk Score (%) 13.63 Filed at 01/24/2024 0801       This score is the patient's risk of an unplanned readmission within 30 days of being discharged (0 -100%). The score is based on dignosis, age, lab data, medications, orders, and past utilization.   Low:  0-14.9   Medium: 15-21.9   High: 22-29.9   Extreme: 30 and above          Admitted From: Home Disposition: SNF  Recommendations for Outpatient Follow-up:  Follow up with PCP in 1-2 weeks Please obtain BMP/CBC in one week Follow-up with orthopedics in 2 weeks Please follow up with your PCP on the following pending results: Unresulted Labs (From admission, onward)     Start     Ordered   01/29/24 0500  Creatinine, serum  (enoxaparin  (LOVENOX )  CrCl >/= 30 mL/min  )  Weekly,   R     Comments: while on enoxaparin  therapy.    01/22/24 1618   01/23/24 0500  CBC  Daily,   R     Comments: For 3 days.    01/22/24 1618              Home Health: None Equipment/Devices: None  Discharge Condition: Stable CODE STATUS: DNR Diet recommendation: Cardiac  Subjective: Seen and examined.  She has no complaints.  She is alert and oriented to self only which is her baseline.  Brief/Interim Summary: Erika [name] is a 83 y.o. female with medical history significant of complete heart block status post pacemaker 2002, GERD, hypertension, hyperlipidemia, dementia, severe osteoporosis who presented to emergency department with an unwitnessed fall in her driveway.  She was brought into the ED by EMS. On arrival she is afebrile hemodynamically stable.  X-ray of hip showed acute displaced right femoral  neck fracture.  CT head showed no acute abnormalities.  Orthopedics was consulted and patient was admitted to [facility] and underwent,  total hip replacement 01/22/2024.  Seen by PT OT, they recommended SNF.  Patient has been cleared for discharge from orthopedics with DVT prophylaxis with Lovenox  for 2 weeks and pain medications.  She is going to be discharged to SNF in stable condition.   Severe dementia/cognitive impairment: Continue memantine  and lamotrigine .  Due to high risk of delirium, we treated her prophylactically with Seroquel, she did not have any delirium.   Chronic anemia: At baseline, hemoglobin appears to be anywhere from 10.5-12.  Currently at baseline.  Monitor closely.   Mild AKI on CKD stage IIIa: Patient's baseline creatinine between 0.9-2 1.1.  jumped to 1.41 meeting criteria for AKI, gently hydrated, creatinine down to 0.98.   GERD: Continue Pepcid.   Essential hypertension: Resume PTA medications.   Depression: Continue Celexa.  Discharge plan was discussed with patient and/or family member and they verbalized understanding and agreed with it.  Discharge Diagnoses:  Principal Problem:   Closed subcapital fracture of neck of right femur, initial encounter [facility])    Discharge Instructions   Allergies as of 01/24/2024       Reactions   Donepezil Other (See Comments)   Feb 2021 -Continuous headache while on donepezil that resolved when stopped taking it  Xanax  [alprazolam ] Other (See Comments)   Because not like herself and wild        Medication List     TAKE these medications    acetaminophen  325 MG tablet Commonly known as: TYLENOL  Take 2 tablets (650 mg total) by mouth every 4 (four) hours as needed for headache or mild pain. What changed: when to take this   amLODipine 5 MG tablet Commonly known as: NORVASC Take 5 mg by mouth at bedtime.   Calcium  Carb-Cholecalciferol 600-800 MG-UNIT Tabs Take 1 tablet by mouth every morning.   citalopram  10 MG tablet Commonly known as: CELEXA Take 10 mg by mouth at bedtime.   enoxaparin  40 MG/0.4ML injection Commonly known as: LOVENOX  Inject 0.4 mLs (40 mg total) into the skin daily for 14 days.   famotidine 40 MG tablet Commonly known as: PEPCID Take 40 mg by mouth in the morning.   gabapentin  100 MG capsule Commonly known as: NEURONTIN  Take 200 mg by mouth at bedtime.   HYDROcodone-acetaminophen  5-325 MG tablet Commonly known as: NORCO/VICODIN Take 1 tablet by mouth every 6 (six) hours as needed for moderate pain (pain score 4-6).   ibuprofen 200 MG tablet Commonly known as: ADVIL Take 400 mg by mouth every 6 (six) hours as needed for mild pain (pain score 1-3) or moderate pain (pain score 4-6).   lamoTRIgine  100 MG tablet Commonly known as: LaMICtal  Take 0.5 tablets (50 mg total) by mouth at bedtime.   memantine  10 MG tablet Commonly known as: Namenda  Take 1 tablet (10 mg total) by mouth 2 (two) times daily.   metoprolol tartrate 25 MG tablet Commonly known as: LOPRESSOR Take 21.5-25 mg by mouth 2 (two) times daily. Take 1 tablet by mouth every morning and 0.5 tablet every night   PROBIOTIC PO Take 1 capsule by mouth daily.   VITAMIN B 12 PO Take 1,000 mcg by mouth in the morning.   VITAMIN D  PO Take 2,000 Units by mouth daily.        Follow-up Information     Sarde, Lene, PA-C. Schedule an appointment as soon as possible for a visit in 2 week(s).   Specialty: Orthopedic Surgery Contact information: [address] [place] [address] [phone]         Tena Feeling, MD Follow up in 1 week(s).   Specialty: Internal Medicine Contact information: [address] [place] [address] [phone]                Allergies  Allergen Reactions   Donepezil Other (See Comments)    Feb 2021 -Continuous headache while on donepezil that resolved when stopped taking it    Xanax  [Alprazolam ] Other (See Comments)     Because not like herself and wild    Consultations: Orthopedics   Procedures/Studies: DG HIP UNILAT WITH PELVIS 1V RIGHT Result Date: 01/22/2024 CLINICAL DATA:  Elective surgery. EXAM: DG HIP (WITH OR WITHOUT PELVIS) 1V RIGHT COMPARISON:  Preoperative imaging FINDINGS: Seven fluoroscopic spot views of the pelvis and right hip obtained in the operating room. Sequential images during hip arthroplasty. Fluoroscopy time 29 seconds. Dose 2.73 mGy. IMPRESSION: Intraoperative fluoroscopy during right hip arthroplasty. Electronically Signed   By: Chadwick Colonel M.D.   On: 01/22/2024 16:16   DG C-Arm 1-60 Min-No Report Result Date: 01/22/2024 Fluoroscopy was utilized by the requesting physician.  No radiographic interpretation.   DG C-Arm 1-60 Min-No Report Result Date: 01/22/2024 Fluoroscopy was utilized by the requesting  physician.  No radiographic interpretation.   CT HEAD WO CONTRAST ( ) Addendum Date: 01/21/2024 ADDENDUM REPORT: 01/21/2024 20:26 ADDENDUM: Bilateral biapical pulmonary micronodules-no further follow-up indicated. Electronically Signed   By: Morgane  Naveau M.D.   On: 01/21/2024 20:26   Result Date: 01/21/2024 CLINICAL DATA:  Head trauma, minor, normal mental status (Age 75-62y) Unwitnessed fall found outside by a neighbor. She is unsure of what happened or how she ended up on the ground outside. Shortening and rotation of the right leg. Complaining of pain in the right hip. EXAM: CT HEAD WITHOUT CONTRAST CT CERVICAL SPINE WITHOUT CONTRAST TECHNIQUE: Multidetector CT imaging of the head and cervical spine was performed following the standard protocol without intravenous contrast. Multiplanar CT image reconstructions of the cervical spine were also generated. RADIATION DOSE REDUCTION: This exam was performed according to the departmental dose-optimization program which includes automated exposure control, adjustment of the mA and/or kV according to patient size and/or use of iterative  reconstruction technique. COMPARISON:  CT head 01/19/2023, CT head 02/02/2020 FINDINGS: CT HEAD FINDINGS Brain: Cerebral ventricle sizes are concordant with the degree of cerebral volume loss. Patchy and confluent areas of decreased attenuation are noted throughout the deep and periventricular white matter of the cerebral hemispheres bilaterally, compatible with chronic microvascular ischemic disease. No evidence of large-territorial acute infarction. No parenchymal hemorrhage. No mass lesion. No extra-axial collection. No mass effect or midline shift. No hydrocephalus. Basilar cisterns are patent. Vascular: No hyperdense vessel. Atherosclerotic calcifications are present within the cavernous internal carotid and vertebral arteries. Skull: No acute fracture or focal lesion. Sinuses/Orbits: Paranasal sinuses and mastoid air cells are clear. The orbits are unremarkable. Other: None. CT CERVICAL SPINE FINDINGS Alignment: Normal. Skull base and vertebrae: Multilevel moderate severe degenerative changes of the spine. No severe osseous foraminal or central canal stenosis. No acute fracture. No aggressive appearing focal osseous lesion or focal pathologic process. Soft tissues and spinal canal: No prevertebral fluid or swelling. No visible canal hematoma. Upper chest: Biapical pleural/pulmonary scarring. Other: No genius thyroid  glands with 1.4 cm right thyroid  hypodense nodule-no further follow-up indicated. Atherosclerotic plaque of the carotid arteries within the neck. IMPRESSION: 1. No acute intracranial abnormality. 2. No acute displaced fracture or traumatic listhesis of the cervical spine. Electronically Signed: By: Morgane  Naveau M.D. On: 01/21/2024 20:21   CT Cervical Spine Wo Contrast Result Date: 01/21/2024 CLINICAL DATA:  CLINICAL DATA: Head trauma, minor, normal mental status (Age 29-64y) Unwitnessed fall found outside by a neighbor. She is unsure of what happened or how she ended up on the ground outside.  Shortening and rotation of the right leg. Complaining of pain in the right hip. EXAM: CT HEAD WITHOUT CONTRAST CT CERVICAL SPINE WITHOUT CONTRAST TECHNIQUE: Multidetector CT imaging of the head and cervical spine was performed following the standard protocol without intravenous contrast. Multiplanar CT image reconstructions of the cervical spine were also generated. RADIATION DOSE REDUCTION: This exam was performed according to the departmental dose-optimization program which includes automated exposure control, adjustment of the mA and/or kV according to patient size and/or use of iterative reconstruction technique. COMPARISON: CT head 01/19/2023, CT head 02/02/2020 FINDINGS: CT HEAD FINDINGS Brain: Cerebral ventricle sizes are concordant with the degree of cerebral volume loss. Patchy and confluent areas of decreased attenuation are noted throughout the deep and periventricular white matter of the cerebral hemispheres bilaterally, compatible with chronic microvascular ischemic disease. No evidence of large-territorial acute infarction. No parenchymal hemorrhage. No mass lesion. No extra-axial collection. No mass effect or midline  shift. No hydrocephalus. Basilar cisterns are patent. Vascular: No hyperdense vessel. Atherosclerotic calcifications are present within the cavernous internal carotid and vertebral arteries. Skull: No acute fracture or focal lesion. Sinuses/Orbits: Paranasal sinuses and mastoid air cells are clear. The orbits are unremarkable. Other: None. CT CERVICAL SPINE FINDINGS Alignment: Normal. Skull base and vertebrae: Multilevel moderate severe degenerative changes of the spine. No severe osseous foraminal or central canal stenosis. No acute fracture. No aggressive appearing focal osseous lesion or focal pathologic process. Soft tissues and spinal canal: No prevertebral fluid or swelling. No visible canal hematoma. Upper chest: Biapical pleural/pulmonary scarring. Bilateral biapical pulmonary  micronodules-no further follow-up indicated. Other: No genius thyroid  glands with 1.4 cm right thyroid  hypodense nodule-no further follow-up indicated. Atherosclerotic plaque of the carotid arteries within the neck. IMPRESSION: 1. No acute intracranial abnormality. 2. No acute displaced fracture or traumatic listhesis of the cervical spine. Electronically Signed   By: Morgane  Naveau M.D.   On: 01/21/2024 20:26   DG Chest 1 View Result Date: 01/21/2024 CLINICAL DATA:  pre op EXAM: CHEST  1 VIEW COMPARISON:  Chest x-ray 02/04/2020 FINDINGS: Left chest 2 lead pacemaker. The heart and mediastinal contours are within normal limits. Atherosclerotic plaque. No focal consolidation. No pulmonary edema. No pleural effusion. No pneumothorax. No acute osseous abnormality. IMPRESSION: 1. No active disease. 2.  Aortic Atherosclerosis (ICD10-I70.0). Electronically Signed   By: Morgane  Naveau M.D.   On: 01/21/2024 20:25   DG HIP UNILAT WITH PELVIS 2-3 VIEWS RIGHT Result Date: 01/21/2024 CLINICAL DATA:  [id number] Fall [id number] EXAM: DG HIP (WITH OR WITHOUT PELVIS) 2-3V RIGHT COMPARISON:  None Available. FINDINGS: Limited evaluation due to overlapping osseous structures and overlying soft tissues. Acute displaced poorly visualized right subcapital femoral neck fracture. No right hip dislocation. No left hip dislocation or acute displaced fracture. No acute displaced fracture or diastasis of the bones of the pelvis. There is no evidence of severe arthropathy or other focal bone abnormality. Densities overlying the pelvis and right proximal thigh on frontal view appear external to patient on lateral view. IMPRESSION: Acute displaced poorly visualized right subcapital femoral neck fracture. Electronically Signed   By: Morgane  Naveau M.D.   On: 01/21/2024 20:24     Discharge Exam: Vitals:   01/24/24 0529 01/24/24 0745  BP: 131/84 129/65  Pulse: 71 70  Resp: 16 19  Temp: (!) 97.5 F (36.4 C) 98.8 F (37.1 C)  SpO2: 94%  91%   Vitals:   01/23/24 1518 01/23/24 2009 01/24/24 0529 01/24/24 0745  BP: 107/61 (!) 141/58 131/84 129/65  Pulse: 63 73 71 70  Resp: 16 16 16 19   Temp: 98.1 F (36.7 C) 98.3 F (36.8 C) (!) 97.5 F (36.4 C) 98.8 F (37.1 C)  TempSrc:      SpO2: 98% 96% 94% 91%  Weight:      Height:        General: Pt is alert, awake, not in acute distress Cardiovascular: RRR, S1/S2 +, no rubs, no gallops Respiratory: CTA bilaterally, no wheezing, no rhonchi Abdominal: Soft, NT, ND, bowel sounds + Extremities: no edema, no cyanosis    The results of significant diagnostics from this hospitalization (including imaging, microbiology, ancillary and laboratory) are listed below for reference.     Microbiology: Recent Results (from the past 240 hours)  Surgical PCR screen     Status: None   Collection Time: 01/22/24  1:22 AM   Specimen: Nasal Mucosa; Nasal Swab  Result Value Ref Range Status  MRSA, PCR NEGATIVE NEGATIVE Final   Staphylococcus aureus NEGATIVE NEGATIVE Final    Comment: (NOTE) The Xpert SA Assay (FDA approved for NASAL specimens in patients 45 years of age and older), is one component of a comprehensive surveillance program. It is not intended to diagnose infection nor to guide or monitor treatment. Performed at [facility], [address], [place], [place] [address]      Labs: BNP (last 3 results) No results for input(s): "BNP" in the last 8760 hours. Basic Metabolic Panel: Recent Labs  Lab 01/21/24 1835 01/22/24 1825 01/23/24 0635 01/24/24 0634  NA 139  --  135 137  K 3.5  --  4.1 3.9  CL 102  --  100 104  CO2 28  --  23 23  GLUCOSE 116*  --  108* 87  BUN 17  --  21 21  CREATININE 1.10* 0.96 1.41* 0.98  CALCIUM  9.1  --  8.5* 8.5*   Liver Function Tests: Recent Labs  Lab 01/21/24 1835  AST 18  ALT 10  ALKPHOS 79  BILITOT 0.7  PROT 6.9  ALBUMIN 3.7   No results for input(s): "LIPASE", "AMYLASE" in the last 168 hours. No results for  input(s): "AMMONIA" in the last 168 hours. CBC: Recent Labs  Lab 01/21/24 1835 01/22/24 1825 01/23/24 0635 01/24/24 0634  WBC 10.9* 16.1* 14.0* 12.5*  NEUTROABS 8.3*  --  11.3*  --   HGB 12.1 12.0 10.8* 10.2*  HCT 39.8 37.2 33.4* 31.8*  MCV 91.3 87.5 87.9 89.1  PLT 211 219 191 185   Cardiac Enzymes: Recent Labs  Lab 01/21/24 1835  CKTOTAL 44   BNP: Invalid input(s): "POCBNP" CBG: No results for input(s): "GLUCAP" in the last 168 hours. D-Dimer No results for input(s): "DDIMER" in the last 72 hours. Hgb A1c No results for input(s): "HGBA1C" in the last 72 hours. Lipid Profile No results for input(s): "CHOL", "HDL", "LDLCALC", "TRIG", "CHOLHDL", "LDLDIRECT" in the last 72 hours. Thyroid  function studies No results for input(s): "TSH", "T4TOTAL", "T3FREE", "THYROIDAB" in the last 72 hours.  Invalid input(s): "FREET3" Anemia work up No results for input(s): "VITAMINB12", "FOLATE", "FERRITIN", "TIBC", "IRON", "RETICCTPCT" in the last 72 hours. Urinalysis    Component Value Date/Time   COLORURINE STRAW (A) 02/02/2020 1650   APPEARANCEUR CLEAR 02/02/2020 1650   LABSPEC 1.012 02/02/2020 1650   PHURINE 7.0 02/02/2020 1650   GLUCOSEU NEGATIVE 02/02/2020 1650   HGBUR NEGATIVE 02/02/2020 1650   BILIRUBINUR NEGATIVE 02/02/2020 1650   KETONESUR 5 (A) 02/02/2020 1650   PROTEINUR 100 (A) 02/02/2020 1650   UROBILINOGEN 0.2 02/02/2012 1105   NITRITE NEGATIVE 02/02/2020 1650   LEUKOCYTESUR NEGATIVE 02/02/2020 1650   Sepsis Labs Recent Labs  Lab 01/21/24 1835 01/22/24 1825 01/23/24 0635 01/24/24 0634  WBC 10.9* 16.1* 14.0* 12.5*   Microbiology Recent Results (from the past 240 hours)  Surgical PCR screen     Status: None   Collection Time: 01/22/24  1:22 AM   Specimen: Nasal Mucosa; Nasal Swab  Result Value Ref Range Status   MRSA, PCR NEGATIVE NEGATIVE Final   Staphylococcus aureus NEGATIVE NEGATIVE Final    Comment: (NOTE) The Xpert SA Assay (FDA approved for  NASAL specimens in patients 61 years of age and older), is one component of a comprehensive surveillance program. It is not intended to diagnose infection nor to guide or monitor treatment. Performed at [facility], [address], [place], [place] [address]     FURTHER  DISCHARGE INSTRUCTIONS:   Get Medicines reviewed and adjusted: Please take all your medications with you for your next visit with your Primary MD   Laboratory/radiological data: Please request your Primary MD to go over all hospital tests and procedure/radiological results at the follow up, please ask your Primary MD to get all Hospital records sent to his/her office.   In some cases, they will be blood work, cultures and biopsy results pending at the time of your discharge. Please request that your primary care M.D. goes through all the records of your hospital data and follows up on these results.   Also Note the following: If you experience worsening of your admission symptoms, develop shortness of breath, life threatening emergency, suicidal or homicidal thoughts you must seek medical attention immediately by calling 911 or calling your MD immediately  if symptoms less severe.   You must read complete instructions/literature along with all the possible adverse reactions/side effects for all the Medicines you take and that have been prescribed to you. Take any new Medicines after you have completely understood and accpet all the possible adverse reactions/side effects.    Do not drive when taking Pain medications or sleeping medications (Benzodaizepines)   Do not take more than prescribed Pain, Sleep and Anxiety Medications. It is not advisable to combine anxiety,sleep and pain medications without talking with your primary care practitioner   Special Instructions: If you have smoked or chewed Tobacco  in the last 2 yrs please stop smoking, stop any regular Alcohol  and or any Recreational drug use.   Wear Seat  belts while driving.   Please note: You were cared for by a hospitalist during your hospital stay. Once you are discharged, your primary care physician will handle any further medical issues. Please note that NO REFILLS for any discharge medications will be authorized once you are discharged, as it is imperative that you return to your primary care physician (or establish a relationship with a primary care physician if you do not have one) for your post hospital discharge needs so that they can reassess your need for medications and monitor your lab values  Time coordinating discharge: Over 30 minutes  SIGNED:   Modena Andes, MD  [facility] 01/24/2024, 11:36 AM *Please note that this is a verbal dictation therefore any spelling or grammatical errors are due to the "Dragon Medical One" system interpretation. If 7PM-7AM, please contact night-coverage [email or web address]

## 2024-01-25 DIAGNOSIS — M25551 Pain in right hip: Secondary | ICD-10-CM | POA: Diagnosis not present

## 2024-01-25 DIAGNOSIS — S79929A Unspecified injury of unspecified thigh, initial encounter: Secondary | ICD-10-CM | POA: Diagnosis not present

## 2024-01-25 DIAGNOSIS — F0280 Dementia in other diseases classified elsewhere without behavioral disturbance: Secondary | ICD-10-CM | POA: Diagnosis not present

## 2024-01-25 DIAGNOSIS — M810 Age-related osteoporosis without current pathological fracture: Secondary | ICD-10-CM | POA: Diagnosis not present

## 2024-01-25 DIAGNOSIS — F32A Depression, unspecified: Secondary | ICD-10-CM | POA: Diagnosis not present

## 2024-01-25 DIAGNOSIS — F0390 Unspecified dementia without behavioral disturbance: Secondary | ICD-10-CM | POA: Diagnosis not present

## 2024-01-25 DIAGNOSIS — S72011D Unspecified intracapsular fracture of right femur, subsequent encounter for closed fracture with routine healing: Secondary | ICD-10-CM | POA: Diagnosis not present

## 2024-01-25 DIAGNOSIS — R2689 Other abnormalities of gait and mobility: Secondary | ICD-10-CM | POA: Diagnosis not present

## 2024-01-25 DIAGNOSIS — Z96641 Presence of right artificial hip joint: Secondary | ICD-10-CM | POA: Diagnosis not present

## 2024-01-25 DIAGNOSIS — I1310 Hypertensive heart and chronic kidney disease without heart failure, with stage 1 through stage 4 chronic kidney disease, or unspecified chronic kidney disease: Secondary | ICD-10-CM | POA: Diagnosis not present

## 2024-01-25 DIAGNOSIS — R0902 Hypoxemia: Secondary | ICD-10-CM | POA: Diagnosis not present

## 2024-01-25 DIAGNOSIS — R41841 Cognitive communication deficit: Secondary | ICD-10-CM | POA: Diagnosis not present

## 2024-01-25 DIAGNOSIS — G301 Alzheimer's disease with late onset: Secondary | ICD-10-CM | POA: Diagnosis not present

## 2024-01-25 DIAGNOSIS — S72041A Displaced fracture of base of neck of right femur, initial encounter for closed fracture: Secondary | ICD-10-CM | POA: Diagnosis not present

## 2024-01-25 DIAGNOSIS — Z743 Need for continuous supervision: Secondary | ICD-10-CM | POA: Diagnosis not present

## 2024-01-25 DIAGNOSIS — M6289 Other specified disorders of muscle: Secondary | ICD-10-CM | POA: Diagnosis not present

## 2024-01-25 DIAGNOSIS — K219 Gastro-esophageal reflux disease without esophagitis: Secondary | ICD-10-CM | POA: Diagnosis not present

## 2024-01-25 DIAGNOSIS — Z7901 Long term (current) use of anticoagulants: Secondary | ICD-10-CM | POA: Diagnosis not present

## 2024-01-25 DIAGNOSIS — R262 Difficulty in walking, not elsewhere classified: Secondary | ICD-10-CM | POA: Diagnosis not present

## 2024-01-25 DIAGNOSIS — I10 Essential (primary) hypertension: Secondary | ICD-10-CM | POA: Diagnosis not present

## 2024-01-25 DIAGNOSIS — S72001A Fracture of unspecified part of neck of right femur, initial encounter for closed fracture: Secondary | ICD-10-CM | POA: Diagnosis not present

## 2024-01-25 DIAGNOSIS — D649 Anemia, unspecified: Secondary | ICD-10-CM | POA: Diagnosis not present

## 2024-01-25 DIAGNOSIS — N1831 Chronic kidney disease, stage 3a: Secondary | ICD-10-CM | POA: Diagnosis not present

## 2024-01-25 DIAGNOSIS — M6259 Muscle wasting and atrophy, not elsewhere classified, multiple sites: Secondary | ICD-10-CM | POA: Diagnosis not present

## 2024-01-25 DIAGNOSIS — Z4789 Encounter for other orthopedic aftercare: Secondary | ICD-10-CM | POA: Diagnosis not present

## 2024-01-25 DIAGNOSIS — Z96649 Presence of unspecified artificial hip joint: Secondary | ICD-10-CM | POA: Diagnosis not present

## 2024-01-25 DIAGNOSIS — S72011A Unspecified intracapsular fracture of right femur, initial encounter for closed fracture: Secondary | ICD-10-CM | POA: Diagnosis not present

## 2024-01-25 DIAGNOSIS — Z9181 History of falling: Secondary | ICD-10-CM | POA: Diagnosis not present

## 2024-01-25 DIAGNOSIS — M6281 Muscle weakness (generalized): Secondary | ICD-10-CM | POA: Diagnosis not present

## 2024-01-25 DIAGNOSIS — G4089 Other seizures: Secondary | ICD-10-CM | POA: Diagnosis not present

## 2024-01-25 DIAGNOSIS — E441 Mild protein-calorie malnutrition: Secondary | ICD-10-CM | POA: Diagnosis not present

## 2024-01-25 DIAGNOSIS — R1311 Dysphagia, oral phase: Secondary | ICD-10-CM | POA: Diagnosis not present

## 2024-01-25 DIAGNOSIS — T8484XD Pain due to internal orthopedic prosthetic devices, implants and grafts, subsequent encounter: Secondary | ICD-10-CM | POA: Diagnosis not present

## 2024-01-25 DIAGNOSIS — R488 Other symbolic dysfunctions: Secondary | ICD-10-CM | POA: Diagnosis not present

## 2024-01-25 NOTE — TOC Transition Note (Addendum)
 Transition of Care [facility]) - Discharge Note   Patient Details  Name: Erika [name] MRN: [id number] Date of Birth: May 08, 1941  [facility]) CM/SW Contact:  Elspeth Hals, LCSW Phone Number: 01/25/2024, 10:44 AM   Clinical Narrative:  Pt discharging to [facility], room 702.  RN call report to [phone].  Daughter [name] will transport and will need pt brought down to main north tower entrance with assistance getting into the vehicle.   1145: CSW spoke with PT, pt requiring stedy, has declined with mobility since last PT session, does now require ambulance transport.  PTAR called.    Final next level of care: Skilled Nursing Facility Barriers to Discharge: Barriers Resolved   Patient Goals and CMS Choice   CMS Medicare.gov Compare Post Acute Care list provided to:: Patient Represenative (must comment) (daughter karen) Choice offered to / list presented to : Adult Children      Discharge Placement              Patient chooses bed at: [facility] Patient to be transferred to facility by: daughter [name] Name of family member notified: daughter [name] in room Patient and family notified of of transfer: 01/25/24  Discharge Plan and Services Additional resources added to the After Visit Summary for   In-house Referral: Clinical Social Work   Post Acute Care Choice: Skilled Nursing Facility                               Social Drivers of Health (SDOH) Interventions SDOH Screenings   Food Insecurity: Patient Unable To Answer (01/23/2024)  Housing: Patient Unable To Answer (01/23/2024)  Transportation Needs: Patient Unable To Answer (01/23/2024)  Utilities: Patient Unable To Answer (01/23/2024)  Social Connections: Unknown (01/23/2024)  Tobacco Use: Low Risk  (01/22/2024)     Readmission Risk Interventions     No data to display

## 2024-01-25 NOTE — TOC Progression Note (Addendum)
 Transition of Care [facility]) - Progression Note    Patient Details  Name: Erika [name] MRN: [id number] Date of Birth: 1941/07/18  Transition [facility]) CM/SW Contact  Elspeth Hals, LCSW Phone Number: 01/25/2024, 9:58 AM  Clinical Narrative:   [facility].  CSW spoke with daughter, she does want to accept offer at [facility].  CSW confirmed with [facility] that they can receive pt today.  MD informed to updated DC summary.  CSW spoke with daughter regarding transportation and she can bring pt if she has assistance getting pt into and out of vehicle.   Expected Discharge Plan: Skilled Nursing Facility Barriers to Discharge: Continued Medical Work up, SNF Pending bed offer  Expected Discharge Plan and Services In-house Referral: Clinical Social Work   Post Acute Care Choice: Skilled Nursing Facility Living arrangements for the past 2 months: Single Family Home Expected Discharge Date: 01/24/24                                     Social Determinants of Health (SDOH) Interventions SDOH Screenings   Food Insecurity: Patient Unable To Answer (01/23/2024)  Housing: Patient Unable To Answer (01/23/2024)  Transportation Needs: Patient Unable To Answer (01/23/2024)  Utilities: Patient Unable To Answer (01/23/2024)  Social Connections: Unknown (01/23/2024)  Tobacco Use: Low Risk  (01/22/2024)    Readmission Risk Interventions     No data to display

## 2024-01-25 NOTE — Discharge Summary (Signed)
 Physician Discharge Summary  Erika [name] ZOX:[id number] DOB: 1941/04/17 DOA: 01/21/2024  PCP: Erika Feeling, MD  Admit date: 01/21/2024 Discharge date: 01/25/2024 30 Day Unplanned Readmission Risk Score    Flowsheet Row ED to Hosp-Admission (Current) from 01/21/2024 in [facility]  30 Day Unplanned Readmission Risk Score (%) 13.63 Filed at 01/24/2024 0801       This score is the patient's risk of an unplanned readmission within 30 days of being discharged (0 -100%). The score is based on dignosis, age, lab data, medications, orders, and past utilization.   Low:  0-14.9   Medium: 15-21.9   High: 22-29.9   Extreme: 30 and above          Admitted From: Home Disposition: SNF  Recommendations for Outpatient Follow-up:  Follow up with PCP in 1-2 weeks Please obtain BMP/CBC in one week Follow-up with orthopedics in 2 weeks Please follow up with your PCP on the following pending results: Unresulted Labs (From admission, onward)     Start     Ordered   01/29/24 0500  Creatinine, serum  (enoxaparin  (LOVENOX )  CrCl >/= 30 mL/min  )  Weekly,   R     Comments: while on enoxaparin  therapy.    01/22/24 1618   01/23/24 0500  CBC  Daily,   R     Comments: For 3 days.    01/22/24 1618              Home Health: None Equipment/Devices: None  Discharge Condition: Stable CODE STATUS: DNR Diet recommendation: Cardiac  Subjective: Seen and examined.  She has no complaints.  She is alert and oriented to self only which is her baseline.  Brief/Interim Summary: Erika [name] is a 83 y.o. female with medical history significant of complete heart block status post pacemaker 2002, GERD, hypertension, hyperlipidemia, dementia, severe osteoporosis who presented to emergency department with an unwitnessed fall in her driveway.  She was brought into the ED by EMS. On arrival she is afebrile hemodynamically stable.  X-ray of hip showed acute displaced right femoral  neck fracture.  CT head showed no acute abnormalities.  Orthopedics was consulted and patient was admitted to [facility] and underwent,  total hip replacement 01/22/2024.  Seen by PT OT, they recommended SNF.  Patient has been cleared for discharge from orthopedics with DVT prophylaxis with Lovenox  for 2 weeks and pain medications.  She is going to be discharged to SNF in stable condition.   Severe dementia/cognitive impairment: Continue memantine  and lamotrigine .  Due to high risk of delirium, we treated her prophylactically with Seroquel , she did not have any delirium.   Chronic anemia: At baseline, hemoglobin appears to be anywhere from 10.5-12.  Currently at baseline.  Monitor closely.   Mild AKI on CKD stage IIIa: Patient's baseline creatinine between 0.9-2 1.1.  jumped to 1.41 meeting criteria for AKI, gently hydrated, creatinine down to 0.98.   GERD: Continue Pepcid .   Essential hypertension: Resume PTA medications.   Depression: Continue Celexa .  Addendum 01/25/2024: Patient was discharged yesterday on 01/24/2024 as I was informed that patient has a bed at [facility] and insurance authorization has been obtained however later on, something happened and SNF took away the bed for this patient.  TOC has sent referral to other facilities.  Patient is as stable as she was yesterday.  I have had lengthy discussion with the daughter Erika [name] over the phone today and she has no concerns with  patient discharging today.  Patient will be discharged today once bed is available.  Discharge plan was discussed with patient and/or family member and they verbalized understanding and agreed with it.  Discharge Diagnoses:  Principal Problem:   Closed subcapital fracture of neck of right femur, initial encounter [facility])    Discharge Instructions   Allergies as of 01/25/2024       Reactions   Donepezil Other (See Comments)   Feb 2021 -Continuous headache while on donepezil that resolved when stopped taking it     Xanax  [alprazolam ] Other (See Comments)   Because not like herself and wild        Medication List     TAKE these medications    acetaminophen  325 MG tablet Commonly known as: TYLENOL  Take 2 tablets (650 mg total) by mouth every 4 (four) hours as needed for headache or mild pain. What changed: when to take this   amLODipine  5 MG tablet Commonly known as: NORVASC  Take 5 mg by mouth at bedtime.   Calcium  Carb-Cholecalciferol 600-800 MG-UNIT Tabs Take 1 tablet by mouth every morning.   citalopram  10 MG tablet Commonly known as: CELEXA  Take 10 mg by mouth at bedtime.   enoxaparin  40 MG/0.4ML injection Commonly known as: LOVENOX  Inject 0.4 mLs (40 mg total) into the skin daily for 14 days.   famotidine  40 MG tablet Commonly known as: PEPCID  Take 40 mg by mouth in the morning.   gabapentin  100 MG capsule Commonly known as: NEURONTIN  Take 200 mg by mouth at bedtime.   HYDROcodone -acetaminophen  5-325 MG tablet Commonly known as: NORCO/VICODIN Take 1 tablet by mouth every 6 (six) hours as needed for moderate pain (pain score 4-6).   ibuprofen 200 MG tablet Commonly known as: ADVIL Take 400 mg by mouth every 6 (six) hours as needed for mild pain (pain score 1-3) or moderate pain (pain score 4-6).   lamoTRIgine  100 MG tablet Commonly known as: LaMICtal  Take 0.5 tablets (50 mg total) by mouth at bedtime.   memantine  10 MG tablet Commonly known as: Namenda  Take 1 tablet (10 mg total) by mouth 2 (two) times daily.   metoprolol  tartrate 25 MG tablet Commonly known as: LOPRESSOR  Take 21.5-25 mg by mouth 2 (two) times daily. Take 1 tablet by mouth every morning and 0.5 tablet every night   PROBIOTIC PO Take 1 capsule by mouth daily.   VITAMIN B 12 PO Take 1,000 mcg by mouth in the morning.   VITAMIN D  PO Take 2,000 Units by mouth daily.        Follow-up Information     Erika, Albers, PA-C. Schedule an appointment as soon as possible for a visit in 2 week(s).    Specialty: Orthopedic Surgery Contact information: [address] [place] [address] [phone]         Erika Feeling, MD Follow up in 1 week(s).   Specialty: Internal Medicine Contact information: [address] [place] [address] [phone]                Allergies  Allergen Reactions   Donepezil Other (See Comments)    Feb 2021 -Continuous headache while on donepezil that resolved when stopped taking it    Xanax  [Alprazolam ] Other (See Comments)    Because not like herself and wild    Consultations: Orthopedics   Procedures/Studies: DG HIP UNILAT WITH PELVIS 1V RIGHT Result Date: 01/22/2024 CLINICAL DATA:  Elective surgery. EXAM: DG HIP (WITH OR WITHOUT PELVIS)  1V RIGHT COMPARISON:  Preoperative imaging FINDINGS: Seven fluoroscopic spot views of the pelvis and right hip obtained in the operating room. Sequential images during hip arthroplasty. Fluoroscopy time 29 seconds. Dose 2.73 mGy. IMPRESSION: Intraoperative fluoroscopy during right hip arthroplasty. Electronically Signed   By: Chadwick Colonel M.D.   On: 01/22/2024 16:16   DG C-Arm 1-60 Min-No Report Result Date: 01/22/2024 Fluoroscopy was utilized by the requesting physician.  No radiographic interpretation.   DG C-Arm 1-60 Min-No Report Result Date: 01/22/2024 Fluoroscopy was utilized by the requesting physician.  No radiographic interpretation.   CT HEAD WO CONTRAST ( ) Addendum Date: 01/21/2024 ADDENDUM REPORT: 01/21/2024 20:26 ADDENDUM: Bilateral biapical pulmonary micronodules-no further follow-up indicated. Electronically Signed   By: Morgane  Naveau M.D.   On: 01/21/2024 20:26   Result Date: 01/21/2024 CLINICAL DATA:  Head trauma, minor, normal mental status (Age [age]y) Unwitnessed fall found outside by a neighbor. She is unsure of what happened or how she ended up on the ground outside. Shortening and rotation of the right leg. Complaining of pain in the right hip.  EXAM: CT HEAD WITHOUT CONTRAST CT CERVICAL SPINE WITHOUT CONTRAST TECHNIQUE: Multidetector CT imaging of the head and cervical spine was performed following the standard protocol without intravenous contrast. Multiplanar CT image reconstructions of the cervical spine were also generated. RADIATION DOSE REDUCTION: This exam was performed according to the departmental dose-optimization program which includes automated exposure control, adjustment of the mA and/or kV according to patient size and/or use of iterative reconstruction technique. COMPARISON:  CT head 01/19/2023, CT head 02/02/2020 FINDINGS: CT HEAD FINDINGS Brain: Cerebral ventricle sizes are concordant with the degree of cerebral volume loss. Patchy and confluent areas of decreased attenuation are noted throughout the deep and periventricular white matter of the cerebral hemispheres bilaterally, compatible with chronic microvascular ischemic disease. No evidence of large-territorial acute infarction. No parenchymal hemorrhage. No mass lesion. No extra-axial collection. No mass effect or midline shift. No hydrocephalus. Basilar cisterns are patent. Vascular: No hyperdense vessel. Atherosclerotic calcifications are present within the cavernous internal carotid and vertebral arteries. Skull: No acute fracture or focal lesion. Sinuses/Orbits: Paranasal sinuses and mastoid air cells are clear. The orbits are unremarkable. Other: None. CT CERVICAL SPINE FINDINGS Alignment: Normal. Skull base and vertebrae: Multilevel moderate severe degenerative changes of the spine. No severe osseous foraminal or central canal stenosis. No acute fracture. No aggressive appearing focal osseous lesion or focal pathologic process. Soft tissues and spinal canal: No prevertebral fluid or swelling. No visible canal hematoma. Upper chest: Biapical pleural/pulmonary scarring. Other: No genius thyroid  glands with 1.4 cm right thyroid  hypodense nodule-no further follow-up indicated.  Atherosclerotic plaque of the carotid arteries within the neck. IMPRESSION: 1. No acute intracranial abnormality. 2. No acute displaced fracture or traumatic listhesis of the cervical spine. Electronically Signed: By: Morgane  Naveau M.D. On: 01/21/2024 20:21   CT Cervical Spine Wo Contrast Result Date: 01/21/2024 CLINICAL DATA:  CLINICAL DATA: Head trauma, minor, normal mental status (Age [age]y) Unwitnessed fall found outside by a neighbor. She is unsure of what happened or how she ended up on the ground outside. Shortening and rotation of the right leg. Complaining of pain in the right hip. EXAM: CT HEAD WITHOUT CONTRAST CT CERVICAL SPINE WITHOUT CONTRAST TECHNIQUE: Multidetector CT imaging of the head and cervical spine was performed following the standard protocol without intravenous contrast. Multiplanar CT image reconstructions of the cervical spine were also generated. RADIATION DOSE REDUCTION: This exam was performed according to the departmental dose-optimization program  which includes automated exposure control, adjustment of the mA and/or kV according to patient size and/or use of iterative reconstruction technique. COMPARISON: CT head 01/19/2023, CT head 02/02/2020 FINDINGS: CT HEAD FINDINGS Brain: Cerebral ventricle sizes are concordant with the degree of cerebral volume loss. Patchy and confluent areas of decreased attenuation are noted throughout the deep and periventricular white matter of the cerebral hemispheres bilaterally, compatible with chronic microvascular ischemic disease. No evidence of large-territorial acute infarction. No parenchymal hemorrhage. No mass lesion. No extra-axial collection. No mass effect or midline shift. No hydrocephalus. Basilar cisterns are patent. Vascular: No hyperdense vessel. Atherosclerotic calcifications are present within the cavernous internal carotid and vertebral arteries. Skull: No acute fracture or focal lesion. Sinuses/Orbits: Paranasal sinuses and  mastoid air cells are clear. The orbits are unremarkable. Other: None. CT CERVICAL SPINE FINDINGS Alignment: Normal. Skull base and vertebrae: Multilevel moderate severe degenerative changes of the spine. No severe osseous foraminal or central canal stenosis. No acute fracture. No aggressive appearing focal osseous lesion or focal pathologic process. Soft tissues and spinal canal: No prevertebral fluid or swelling. No visible canal hematoma. Upper chest: Biapical pleural/pulmonary scarring. Bilateral biapical pulmonary micronodules-no further follow-up indicated. Other: No genius thyroid  glands with 1.4 cm right thyroid  hypodense nodule-no further follow-up indicated. Atherosclerotic plaque of the carotid arteries within the neck. IMPRESSION: 1. No acute intracranial abnormality. 2. No acute displaced fracture or traumatic listhesis of the cervical spine. Electronically Signed   By: Morgane  Naveau M.D.   On: 01/21/2024 20:26   DG Chest 1 View Result Date: 01/21/2024 CLINICAL DATA:  pre op EXAM: CHEST  1 VIEW COMPARISON:  Chest x-ray 02/04/2020 FINDINGS: Left chest 2 lead pacemaker. The heart and mediastinal contours are within normal limits. Atherosclerotic plaque. No focal consolidation. No pulmonary edema. No pleural effusion. No pneumothorax. No acute osseous abnormality. IMPRESSION: 1. No active disease. 2.  Aortic Atherosclerosis (ICD10-I70.0). Electronically Signed   By: Morgane  Naveau M.D.   On: 01/21/2024 20:25   DG HIP UNILAT WITH PELVIS 2-3 VIEWS RIGHT Result Date: 01/21/2024 CLINICAL DATA:  [id number] Fall [id number] EXAM: DG HIP (WITH OR WITHOUT PELVIS) 2-3V RIGHT COMPARISON:  None Available. FINDINGS: Limited evaluation due to overlapping osseous structures and overlying soft tissues. Acute displaced poorly visualized right subcapital femoral neck fracture. No right hip dislocation. No left hip dislocation or acute displaced fracture. No acute displaced fracture or diastasis of the bones of the pelvis.  There is no evidence of severe arthropathy or other focal bone abnormality. Densities overlying the pelvis and right proximal thigh on frontal view appear external to patient on lateral view. IMPRESSION: Acute displaced poorly visualized right subcapital femoral neck fracture. Electronically Signed   By: Morgane  Naveau M.D.   On: 01/21/2024 20:24     Discharge Exam: Vitals:   01/25/24 0408 01/25/24 0812  BP: 128/67 (!) 133/117  Pulse: 72 70  Resp: 18 20  Temp: 98.4 F (36.9 C) 98.5 F (36.9 C)  SpO2: 96%    Vitals:   01/24/24 1625 01/24/24 2333 01/25/24 0408 01/25/24 0812  BP: 130/71 (!) 145/61 128/67 (!) 133/117  Pulse: 70 70 72 70  Resp: 17 18 18 20   Temp: 97.6 F (36.4 C) 98.2 F (36.8 C) 98.4 F (36.9 C) 98.5 F (36.9 C)  TempSrc: Axillary Oral Oral   SpO2: 90% 95% 96%   Weight:      Height:        General: Pt is alert, awake, not in acute distress Cardiovascular: RRR,  S1/S2 +, no rubs, no gallops Respiratory: CTA bilaterally, no wheezing, no rhonchi Abdominal: Soft, NT, ND, bowel sounds + Extremities: no edema, no cyanosis    The results of significant diagnostics from this hospitalization (including imaging, microbiology, ancillary and laboratory) are listed below for reference.     Microbiology: Recent Results (from the past 240 hours)  Surgical PCR screen     Status: None   Collection Time: 01/22/24  1:22 AM   Specimen: Nasal Mucosa; Nasal Swab  Result Value Ref Range Status   MRSA, PCR NEGATIVE NEGATIVE Final   Staphylococcus aureus NEGATIVE NEGATIVE Final    Comment: (NOTE) The Xpert SA Assay (FDA approved for NASAL specimens in patients 29 years of age and older), is one component of a comprehensive surveillance program. It is not intended to diagnose infection nor to guide or monitor treatment. Performed at [facility], [address], [place], [place] [address]      Labs: BNP (last 3 results) No results for input(s): "BNP" in the last  8760 hours. Basic Metabolic Panel: Recent Labs  Lab 01/21/24 1835 01/22/24 1825 01/23/24 0635 01/24/24 0634  NA 139  --  135 137  K 3.5  --  4.1 3.9  CL 102  --  100 104  CO2 28  --  23 23  GLUCOSE 116*  --  108* 87  BUN 17  --  21 21  CREATININE 1.10* 0.96 1.41* 0.98  CALCIUM  9.1  --  8.5* 8.5*   Liver Function Tests: Recent Labs  Lab 01/21/24 1835  AST 18  ALT 10  ALKPHOS 79  BILITOT 0.7  PROT 6.9  ALBUMIN 3.7   No results for input(s): "LIPASE", "AMYLASE" in the last 168 hours. No results for input(s): "AMMONIA" in the last 168 hours. CBC: Recent Labs  Lab 01/21/24 1835 01/22/24 1825 01/23/24 0635 01/24/24 0634  WBC 10.9* 16.1* 14.0* 12.5*  NEUTROABS 8.3*  --  11.3*  --   HGB 12.1 12.0 10.8* 10.2*  HCT 39.8 37.2 33.4* 31.8*  MCV 91.3 87.5 87.9 89.1  PLT 211 219 191 185   Cardiac Enzymes: Recent Labs  Lab 01/21/24 1835  CKTOTAL 44   BNP: Invalid input(s): "POCBNP" CBG: No results for input(s): "GLUCAP" in the last 168 hours. D-Dimer No results for input(s): "DDIMER" in the last 72 hours. Hgb A1c No results for input(s): "HGBA1C" in the last 72 hours. Lipid Profile No results for input(s): "CHOL", "HDL", "LDLCALC", "TRIG", "CHOLHDL", "LDLDIRECT" in the last 72 hours. Thyroid  function studies No results for input(s): "TSH", "T4TOTAL", "T3FREE", "THYROIDAB" in the last 72 hours.  Invalid input(s): "FREET3" Anemia work up No results for input(s): "VITAMINB12", "FOLATE", "FERRITIN", "TIBC", "IRON", "RETICCTPCT" in the last 72 hours. Urinalysis    Component Value Date/Time   COLORURINE STRAW (A) 02/02/2020 1650   APPEARANCEUR CLEAR 02/02/2020 1650   LABSPEC 1.012 02/02/2020 1650   PHURINE 7.0 02/02/2020 1650   GLUCOSEU NEGATIVE 02/02/2020 1650   HGBUR NEGATIVE 02/02/2020 1650   BILIRUBINUR NEGATIVE 02/02/2020 1650   KETONESUR 5 (A) 02/02/2020 1650   PROTEINUR 100 (A) 02/02/2020 1650   UROBILINOGEN 0.2 02/02/2012 1105   NITRITE NEGATIVE  02/02/2020 1650   LEUKOCYTESUR NEGATIVE 02/02/2020 1650   Sepsis Labs Recent Labs  Lab 01/21/24 1835 01/22/24 1825 01/23/24 0635 01/24/24 0634  WBC 10.9* 16.1* 14.0* 12.5*   Microbiology Recent Results (from the past 240 hours)  Surgical PCR screen     Status: None   Collection Time:  01/22/24  1:22 AM   Specimen: Nasal Mucosa; Nasal Swab  Result Value Ref Range Status   MRSA, PCR NEGATIVE NEGATIVE Final   Staphylococcus aureus NEGATIVE NEGATIVE Final    Comment: (NOTE) The Xpert SA Assay (FDA approved for NASAL specimens in patients 66 years of age and older), is one component of a comprehensive surveillance program. It is not intended to diagnose infection nor to guide or monitor treatment. Performed at [facility], [address] [place], [place] [address]     FURTHER DISCHARGE INSTRUCTIONS:   Get Medicines reviewed and adjusted: Please take all your medications with you for your next visit with your Primary MD   Laboratory/radiological data: Please request your Primary MD to go over all hospital tests and procedure/radiological results at the follow up, please ask your Primary MD to get all Hospital records sent to his/her office.   In some cases, they will be blood work, cultures and biopsy results pending at the time of your discharge. Please request that your primary care M.D. goes through all the records of your hospital data and follows up on these results.   Also Note the following: If you experience worsening of your admission symptoms, develop shortness of breath, life threatening emergency, suicidal or homicidal thoughts you must seek medical attention immediately by calling 911 or calling your MD immediately  if symptoms less severe.   You must read complete instructions/literature along with all the possible adverse reactions/side effects for all the Medicines you take and that have been prescribed to you. Take any new Medicines after you have  completely understood and accpet all the possible adverse reactions/side effects.    Do not drive when taking Pain medications or sleeping medications (Benzodaizepines)   Do not take more than prescribed Pain, Sleep and Anxiety Medications. It is not advisable to combine anxiety,sleep and pain medications without talking with your primary care practitioner   Special Instructions: If you have smoked or chewed Tobacco  in the last 2 yrs please stop smoking, stop any regular Alcohol  and or any Recreational drug use.   Wear Seat belts while driving.   Please note: You were cared for by a hospitalist during your hospital stay. Once you are discharged, your primary care physician will handle any further medical issues. Please note that NO REFILLS for any discharge medications will be authorized once you are discharged, as it is imperative that you return to your primary care physician (or establish a relationship with a primary care physician if you do not have one) for your post hospital discharge needs so that they can reassess your need for medications and monitor your lab values  Time coordinating discharge: Over 30 minutes  SIGNED:   Modena Andes, MD  [facility] 01/25/2024, 10:05 AM *Please note that this is a verbal dictation therefore any spelling or grammatical errors are due to the "[employer]agon [employer] One" system interpretation. If 7PM-7AM, please contact night-coverage [email or web address]

## 2024-01-25 NOTE — Progress Notes (Signed)
 Patient discharging via ptar to SNF [facility], iv access removed, discharge packet reviewed with family member, prescriptions in discharge packet, report given to nurse Fatmata at [facility].

## 2024-01-26 DIAGNOSIS — Z9181 History of falling: Secondary | ICD-10-CM | POA: Diagnosis not present

## 2024-01-26 DIAGNOSIS — S72011D Unspecified intracapsular fracture of right femur, subsequent encounter for closed fracture with routine healing: Secondary | ICD-10-CM | POA: Diagnosis not present

## 2024-01-26 DIAGNOSIS — S72041A Displaced fracture of base of neck of right femur, initial encounter for closed fracture: Secondary | ICD-10-CM | POA: Diagnosis not present

## 2024-01-26 DIAGNOSIS — I1310 Hypertensive heart and chronic kidney disease without heart failure, with stage 1 through stage 4 chronic kidney disease, or unspecified chronic kidney disease: Secondary | ICD-10-CM | POA: Diagnosis not present

## 2024-01-26 DIAGNOSIS — G4089 Other seizures: Secondary | ICD-10-CM | POA: Diagnosis not present

## 2024-01-26 DIAGNOSIS — F32A Depression, unspecified: Secondary | ICD-10-CM | POA: Diagnosis not present

## 2024-01-26 DIAGNOSIS — I10 Essential (primary) hypertension: Secondary | ICD-10-CM | POA: Diagnosis not present

## 2024-01-26 DIAGNOSIS — D649 Anemia, unspecified: Secondary | ICD-10-CM | POA: Diagnosis not present

## 2024-01-26 DIAGNOSIS — F0390 Unspecified dementia without behavioral disturbance: Secondary | ICD-10-CM | POA: Diagnosis not present

## 2024-01-26 DIAGNOSIS — G301 Alzheimer's disease with late onset: Secondary | ICD-10-CM | POA: Diagnosis not present

## 2024-01-26 DIAGNOSIS — Z96649 Presence of unspecified artificial hip joint: Secondary | ICD-10-CM | POA: Diagnosis not present

## 2024-01-26 DIAGNOSIS — Z4789 Encounter for other orthopedic aftercare: Secondary | ICD-10-CM | POA: Diagnosis not present

## 2024-01-26 DIAGNOSIS — R2689 Other abnormalities of gait and mobility: Secondary | ICD-10-CM | POA: Diagnosis not present

## 2024-01-26 DIAGNOSIS — M6289 Other specified disorders of muscle: Secondary | ICD-10-CM | POA: Diagnosis not present

## 2024-01-29 ENCOUNTER — Ambulatory Visit: Payer: Medicare Other

## 2024-01-29 DIAGNOSIS — Z96649 Presence of unspecified artificial hip joint: Secondary | ICD-10-CM | POA: Diagnosis not present

## 2024-01-29 DIAGNOSIS — I10 Essential (primary) hypertension: Secondary | ICD-10-CM | POA: Diagnosis not present

## 2024-01-29 DIAGNOSIS — D649 Anemia, unspecified: Secondary | ICD-10-CM | POA: Diagnosis not present

## 2024-01-29 DIAGNOSIS — I1310 Hypertensive heart and chronic kidney disease without heart failure, with stage 1 through stage 4 chronic kidney disease, or unspecified chronic kidney disease: Secondary | ICD-10-CM | POA: Diagnosis not present

## 2024-01-29 DIAGNOSIS — S72041A Displaced fracture of base of neck of right femur, initial encounter for closed fracture: Secondary | ICD-10-CM | POA: Diagnosis not present

## 2024-01-30 DIAGNOSIS — I1310 Hypertensive heart and chronic kidney disease without heart failure, with stage 1 through stage 4 chronic kidney disease, or unspecified chronic kidney disease: Secondary | ICD-10-CM | POA: Diagnosis not present

## 2024-01-30 DIAGNOSIS — F0390 Unspecified dementia without behavioral disturbance: Secondary | ICD-10-CM | POA: Diagnosis not present

## 2024-01-30 DIAGNOSIS — Z4789 Encounter for other orthopedic aftercare: Secondary | ICD-10-CM | POA: Diagnosis not present

## 2024-01-30 DIAGNOSIS — R2689 Other abnormalities of gait and mobility: Secondary | ICD-10-CM | POA: Diagnosis not present

## 2024-01-30 DIAGNOSIS — M6289 Other specified disorders of muscle: Secondary | ICD-10-CM | POA: Diagnosis not present

## 2024-01-30 DIAGNOSIS — Z96649 Presence of unspecified artificial hip joint: Secondary | ICD-10-CM | POA: Diagnosis not present

## 2024-01-30 DIAGNOSIS — I10 Essential (primary) hypertension: Secondary | ICD-10-CM | POA: Diagnosis not present

## 2024-01-30 DIAGNOSIS — G4089 Other seizures: Secondary | ICD-10-CM | POA: Diagnosis not present

## 2024-01-30 DIAGNOSIS — D649 Anemia, unspecified: Secondary | ICD-10-CM | POA: Diagnosis not present

## 2024-01-30 DIAGNOSIS — S72041A Displaced fracture of base of neck of right femur, initial encounter for closed fracture: Secondary | ICD-10-CM | POA: Diagnosis not present

## 2024-01-30 DIAGNOSIS — Z9181 History of falling: Secondary | ICD-10-CM | POA: Diagnosis not present

## 2024-01-30 DIAGNOSIS — S72011D Unspecified intracapsular fracture of right femur, subsequent encounter for closed fracture with routine healing: Secondary | ICD-10-CM | POA: Diagnosis not present

## 2024-01-30 DIAGNOSIS — F32A Depression, unspecified: Secondary | ICD-10-CM | POA: Diagnosis not present

## 2024-01-30 DIAGNOSIS — G301 Alzheimer's disease with late onset: Secondary | ICD-10-CM | POA: Diagnosis not present

## 2024-01-31 DIAGNOSIS — S72041A Displaced fracture of base of neck of right femur, initial encounter for closed fracture: Secondary | ICD-10-CM | POA: Diagnosis not present

## 2024-01-31 DIAGNOSIS — N1831 Chronic kidney disease, stage 3a: Secondary | ICD-10-CM | POA: Diagnosis not present

## 2024-01-31 DIAGNOSIS — Z96641 Presence of right artificial hip joint: Secondary | ICD-10-CM | POA: Diagnosis not present

## 2024-01-31 DIAGNOSIS — D649 Anemia, unspecified: Secondary | ICD-10-CM | POA: Diagnosis not present

## 2024-01-31 DIAGNOSIS — Z7901 Long term (current) use of anticoagulants: Secondary | ICD-10-CM | POA: Diagnosis not present

## 2024-01-31 DIAGNOSIS — I10 Essential (primary) hypertension: Secondary | ICD-10-CM | POA: Diagnosis not present

## 2024-02-02 DIAGNOSIS — R2689 Other abnormalities of gait and mobility: Secondary | ICD-10-CM | POA: Diagnosis not present

## 2024-02-02 DIAGNOSIS — F32A Depression, unspecified: Secondary | ICD-10-CM | POA: Diagnosis not present

## 2024-02-02 DIAGNOSIS — G301 Alzheimer's disease with late onset: Secondary | ICD-10-CM | POA: Diagnosis not present

## 2024-02-02 DIAGNOSIS — I10 Essential (primary) hypertension: Secondary | ICD-10-CM | POA: Diagnosis not present

## 2024-02-02 DIAGNOSIS — Z9181 History of falling: Secondary | ICD-10-CM | POA: Diagnosis not present

## 2024-02-02 DIAGNOSIS — Z7901 Long term (current) use of anticoagulants: Secondary | ICD-10-CM | POA: Diagnosis not present

## 2024-02-02 DIAGNOSIS — S72041A Displaced fracture of base of neck of right femur, initial encounter for closed fracture: Secondary | ICD-10-CM | POA: Diagnosis not present

## 2024-02-02 DIAGNOSIS — N1831 Chronic kidney disease, stage 3a: Secondary | ICD-10-CM | POA: Diagnosis not present

## 2024-02-02 DIAGNOSIS — D649 Anemia, unspecified: Secondary | ICD-10-CM | POA: Diagnosis not present

## 2024-02-02 DIAGNOSIS — F0390 Unspecified dementia without behavioral disturbance: Secondary | ICD-10-CM | POA: Diagnosis not present

## 2024-02-02 DIAGNOSIS — G4089 Other seizures: Secondary | ICD-10-CM | POA: Diagnosis not present

## 2024-02-02 DIAGNOSIS — M6289 Other specified disorders of muscle: Secondary | ICD-10-CM | POA: Diagnosis not present

## 2024-02-02 DIAGNOSIS — I1310 Hypertensive heart and chronic kidney disease without heart failure, with stage 1 through stage 4 chronic kidney disease, or unspecified chronic kidney disease: Secondary | ICD-10-CM | POA: Diagnosis not present

## 2024-02-02 DIAGNOSIS — Z4789 Encounter for other orthopedic aftercare: Secondary | ICD-10-CM | POA: Diagnosis not present

## 2024-02-02 DIAGNOSIS — E441 Mild protein-calorie malnutrition: Secondary | ICD-10-CM | POA: Diagnosis not present

## 2024-02-02 DIAGNOSIS — Z96641 Presence of right artificial hip joint: Secondary | ICD-10-CM | POA: Diagnosis not present

## 2024-02-02 DIAGNOSIS — S72011D Unspecified intracapsular fracture of right femur, subsequent encounter for closed fracture with routine healing: Secondary | ICD-10-CM | POA: Diagnosis not present

## 2024-02-02 DIAGNOSIS — Z96649 Presence of unspecified artificial hip joint: Secondary | ICD-10-CM | POA: Diagnosis not present

## 2024-02-05 DIAGNOSIS — S72001A Fracture of unspecified part of neck of right femur, initial encounter for closed fracture: Secondary | ICD-10-CM | POA: Diagnosis not present

## 2024-02-05 DIAGNOSIS — I10 Essential (primary) hypertension: Secondary | ICD-10-CM | POA: Diagnosis not present

## 2024-02-05 DIAGNOSIS — D649 Anemia, unspecified: Secondary | ICD-10-CM | POA: Diagnosis not present

## 2024-02-05 DIAGNOSIS — Z96641 Presence of right artificial hip joint: Secondary | ICD-10-CM | POA: Diagnosis not present

## 2024-02-05 DIAGNOSIS — F0390 Unspecified dementia without behavioral disturbance: Secondary | ICD-10-CM | POA: Diagnosis not present

## 2024-02-05 DIAGNOSIS — N1831 Chronic kidney disease, stage 3a: Secondary | ICD-10-CM | POA: Diagnosis not present

## 2024-02-06 DIAGNOSIS — S72011D Unspecified intracapsular fracture of right femur, subsequent encounter for closed fracture with routine healing: Secondary | ICD-10-CM | POA: Diagnosis not present

## 2024-02-06 DIAGNOSIS — E441 Mild protein-calorie malnutrition: Secondary | ICD-10-CM | POA: Diagnosis not present

## 2024-02-06 DIAGNOSIS — G4089 Other seizures: Secondary | ICD-10-CM | POA: Diagnosis not present

## 2024-02-06 DIAGNOSIS — M6289 Other specified disorders of muscle: Secondary | ICD-10-CM | POA: Diagnosis not present

## 2024-02-06 DIAGNOSIS — Z9181 History of falling: Secondary | ICD-10-CM | POA: Diagnosis not present

## 2024-02-06 DIAGNOSIS — Z4789 Encounter for other orthopedic aftercare: Secondary | ICD-10-CM | POA: Diagnosis not present

## 2024-02-06 DIAGNOSIS — F32A Depression, unspecified: Secondary | ICD-10-CM | POA: Diagnosis not present

## 2024-02-06 DIAGNOSIS — F0390 Unspecified dementia without behavioral disturbance: Secondary | ICD-10-CM | POA: Diagnosis not present

## 2024-02-06 DIAGNOSIS — R2689 Other abnormalities of gait and mobility: Secondary | ICD-10-CM | POA: Diagnosis not present

## 2024-02-06 DIAGNOSIS — G301 Alzheimer's disease with late onset: Secondary | ICD-10-CM | POA: Diagnosis not present

## 2024-02-07 ENCOUNTER — Other Ambulatory Visit (INDEPENDENT_AMBULATORY_CARE_PROVIDER_SITE_OTHER): Payer: Self-pay

## 2024-02-07 ENCOUNTER — Ambulatory Visit: Admitting: Orthopaedic Surgery

## 2024-02-07 DIAGNOSIS — M25551 Pain in right hip: Secondary | ICD-10-CM

## 2024-02-07 NOTE — Progress Notes (Signed)
 Post-Op Visit Note   Patient: Erika [name]           Date of Birth: 1941-05-29           MRN: [id number] Visit Date: 02/07/2024 PCP: Tena Feeling, MD   Assessment & Plan:  Chief Complaint:  Chief Complaint  Patient presents with   Right Hip - Routine Post Op   Visit Diagnoses:  1. Pain in right hip     Plan: History of Present Illness Erika [name] is an 83 year old female who presents for follow-up after hip replacement surgery.  She ambulates with a walker with some assistance and experiences no significant pain. Her hip flexion is slightly sore and weak. The incision site is managed with strips following the removal of stitches.  Physical Exam SKIN: Incision site appears well-healed with strips in place.  Results RADIOLOGY Hip X-ray: Right total hip replacement in place without complication (02/07/2024)  Assessment and Plan Postoperative care following hip replacement Mild soreness and weakness with hip flexion expected post-surgery. Incision well-healed. X-rays confirm proper component placement. Recovery progressing well with walker. - Leave steri-strips for one week, then remove. - Continue rehabilitation exercises. - Schedule follow-up with lindsey stanbery in four weeks.  Follow-Up Instructions: Return in about 4 weeks (around 03/06/2024) for with lindsey .   Orders:  Orders Placed This Encounter  Procedures   XR HIP UNILAT W OR W/O PELVIS 2-3 VIEWS RIGHT   No orders of the defined types were placed in this encounter.   Imaging: XR HIP UNILAT W OR W/O PELVIS 2-3 VIEWS RIGHT Result Date: 02/07/2024 X-rays of the pelvis show stable right total hip replacement without complication.   PMFS History: Patient Active Problem List   Diagnosis Date Noted   Closed subcapital fracture of neck of right femur, initial encounter (HCC) 01/22/2024   Late onset Alzheimer's dementia without behavioral disturbance, psychotic disturbance, mood disturbance, or anxiety (HCC)  07/29/2021   S/P placement of cardiac pacemaker 07/29/2021   Hypertension 08/13/2020   Pacemaker 07/27/2020   Complete heart block (HCC) 02/02/2020   Spells of decreased attentiveness 01/23/2020   Dementia without behavioral disturbance (HCC) 01/23/2020   Hiatal hernia with obstruction but no gangrene 10/29/2018   Exertional dyspnea    Chest pain    Second degree AV block, Mobitz type II 09/10/2018   Pre-operative cardiovascular examination 09/10/2018   Neck pain on right side 05/13/2013   Right shoulder pain 04/23/2013   Hyperlipidemia 08/28/2012   Irritable bowel syndrome 08/28/2012   Past Medical History:  Diagnosis Date   Absence of appendix, congenital    Arthritis    Complete heart block (HCC) 02/02/2020   GERD (gastroesophageal reflux disease)    Hiatal hernia    History of colon polyps    History of gastric polyp    Hypercholesterolemia    Hypertension    PER PT NO MEDICINE   Irritable bowel syndrome    resolved last few years   Mobitz type 2 second degree heart block    intermittant,  cardiac work-up done;  Cardiac cath 10-02-2018,  echo 09-24-2018, event monitor 09-24-2018 (all results in epic)   Neck pain    right sided neck pain   OSA (obstructive sleep apnea)    does not use c-pap did not like   Osteoporosis 06/2017   T score -2.8   PSVT (paroxysmal supraventricular tachycardia) (HCC)    short run on event monitor 09-24-2018   Wears glasses  Family History  Problem Relation Age of Onset   Heart disease Father        MI   Kidney failure Mother        pregnant with twins   Heart disease Paternal Uncle        MI   Heart disease Paternal Uncle        MI   Heart disease Paternal Uncle        MI   Heart disease Paternal Uncle        MI   Colon cancer Neg Hx    Liver cancer Neg Hx    Esophageal cancer Neg Hx    Rectal cancer Neg Hx    Stomach cancer Neg Hx     Past Surgical History:  Procedure Laterality Date   CATARACT EXTRACTION W/ INTRAOCULAR  LENS  IMPLANT, BILATERAL     COLONOSCOPY     cortisone shots in back     ESOPHAGOGASTRODUODENOSCOPY     ESOPHAGOGASTRODUODENOSCOPY N/A 08/14/2013   Procedure: ESOPHAGOGASTRODUODENOSCOPY (EGD);  Surgeon: Mathew Solomon, MD;  Location: [facility];  Service: Endoscopy;  Laterality: N/A;   ESOPHAGOGASTRODUODENOSCOPY (EGD) WITH PROPOFOL  N/A 09/29/2015   Procedure: ESOPHAGOGASTRODUODENOSCOPY (EGD) WITH PROPOFOL ;  Surgeon: Ozell Blunt, MD;  Location: [facility];  Service: Endoscopy;  Laterality: N/A;   FOOT SURGERY Right 06-30-2005   dr duda   osteotomy first and second toe's   HIATAL HERNIA REPAIR N/A 10/29/2018   Procedure: LAPAROSCOPIC REPAIR OF HIATAL HERNIA WITH NISSEN FUNDOPLICATION, WITH MESH;  Surgeon: Ayesha Lente, MD;  Location: [facility];  Service: General;  Laterality: N/A;   HOT HEMOSTASIS N/A 08/14/2013   Procedure: HOT HEMOSTASIS (ARGON PLASMA COAGULATION/BICAP);  Surgeon: Mathew Solomon, MD;  Location: [facility];  Service: Endoscopy;  Laterality: N/A;   HOT HEMOSTASIS N/A 09/29/2015   Procedure: HOT HEMOSTASIS (ARGON PLASMA COAGULATION/BICAP);  Surgeon: Ozell Blunt, MD;  Location: [facility];  Service: Endoscopy;  Laterality: N/A;   PACEMAKER IMPLANT N/A 02/03/2020   Procedure: PACEMAKER IMPLANT;  Surgeon: Tammie Fall, MD;  Location: [facility];  Service: Cardiovascular;  Laterality: N/A;   RIGHT/LEFT HEART CATH AND CORONARY ANGIOGRAPHY N/A 10/02/2018   Procedure: RIGHT/LEFT HEART CATH AND CORONARY ANGIOGRAPHY;  Surgeon: Millicent Ally, MD;  Location: [facility];  Service: Cardiovascular;  Laterality: N/A;   TOTAL HIP ARTHROPLASTY Right 01/22/2024   Procedure: ARTHROPLASTY, HIP, TOTAL, ANTERIOR APPROACH;  Surgeon: Wes Hamman, MD;  Location: [facility];  Service: Orthopedics;  Laterality: Right;   TUBAL LIGATION  yrs ago   Social History   Occupational History    Employer: RETIRED    Comment: Retired  Tobacco Use   Smoking status: Never   Smokeless tobacco: Never   Vaping Use   Vaping status: Never Used  Substance and Sexual Activity   Alcohol use: No   Drug use: No   Sexual activity: Never    Comment: 1st intercourse 51 yo-1 partner

## 2024-02-09 DIAGNOSIS — R2689 Other abnormalities of gait and mobility: Secondary | ICD-10-CM | POA: Diagnosis not present

## 2024-02-09 DIAGNOSIS — Z4789 Encounter for other orthopedic aftercare: Secondary | ICD-10-CM | POA: Diagnosis not present

## 2024-02-09 DIAGNOSIS — S72011D Unspecified intracapsular fracture of right femur, subsequent encounter for closed fracture with routine healing: Secondary | ICD-10-CM | POA: Diagnosis not present

## 2024-02-09 DIAGNOSIS — I10 Essential (primary) hypertension: Secondary | ICD-10-CM | POA: Diagnosis not present

## 2024-02-09 DIAGNOSIS — Z96641 Presence of right artificial hip joint: Secondary | ICD-10-CM | POA: Diagnosis not present

## 2024-02-09 DIAGNOSIS — S72001A Fracture of unspecified part of neck of right femur, initial encounter for closed fracture: Secondary | ICD-10-CM | POA: Diagnosis not present

## 2024-02-09 DIAGNOSIS — E441 Mild protein-calorie malnutrition: Secondary | ICD-10-CM | POA: Diagnosis not present

## 2024-02-09 DIAGNOSIS — N1831 Chronic kidney disease, stage 3a: Secondary | ICD-10-CM | POA: Diagnosis not present

## 2024-02-09 DIAGNOSIS — F32A Depression, unspecified: Secondary | ICD-10-CM | POA: Diagnosis not present

## 2024-02-09 DIAGNOSIS — D649 Anemia, unspecified: Secondary | ICD-10-CM | POA: Diagnosis not present

## 2024-02-09 DIAGNOSIS — G4089 Other seizures: Secondary | ICD-10-CM | POA: Diagnosis not present

## 2024-02-09 DIAGNOSIS — G301 Alzheimer's disease with late onset: Secondary | ICD-10-CM | POA: Diagnosis not present

## 2024-02-09 DIAGNOSIS — F0390 Unspecified dementia without behavioral disturbance: Secondary | ICD-10-CM | POA: Diagnosis not present

## 2024-02-09 DIAGNOSIS — M6289 Other specified disorders of muscle: Secondary | ICD-10-CM | POA: Diagnosis not present

## 2024-02-09 DIAGNOSIS — Z9181 History of falling: Secondary | ICD-10-CM | POA: Diagnosis not present

## 2024-02-16 DIAGNOSIS — Z556 Problems related to health literacy: Secondary | ICD-10-CM | POA: Diagnosis not present

## 2024-02-16 DIAGNOSIS — I442 Atrioventricular block, complete: Secondary | ICD-10-CM | POA: Diagnosis not present

## 2024-02-16 DIAGNOSIS — R32 Unspecified urinary incontinence: Secondary | ICD-10-CM | POA: Diagnosis not present

## 2024-02-16 DIAGNOSIS — G301 Alzheimer's disease with late onset: Secondary | ICD-10-CM | POA: Diagnosis not present

## 2024-02-16 DIAGNOSIS — F32A Depression, unspecified: Secondary | ICD-10-CM | POA: Diagnosis not present

## 2024-02-16 DIAGNOSIS — D631 Anemia in chronic kidney disease: Secondary | ICD-10-CM | POA: Diagnosis not present

## 2024-02-16 DIAGNOSIS — K449 Diaphragmatic hernia without obstruction or gangrene: Secondary | ICD-10-CM | POA: Diagnosis not present

## 2024-02-16 DIAGNOSIS — I1310 Hypertensive heart and chronic kidney disease without heart failure, with stage 1 through stage 4 chronic kidney disease, or unspecified chronic kidney disease: Secondary | ICD-10-CM | POA: Diagnosis not present

## 2024-02-16 DIAGNOSIS — Z9181 History of falling: Secondary | ICD-10-CM | POA: Diagnosis not present

## 2024-02-16 DIAGNOSIS — K589 Irritable bowel syndrome without diarrhea: Secondary | ICD-10-CM | POA: Diagnosis not present

## 2024-02-16 DIAGNOSIS — S72011D Unspecified intracapsular fracture of right femur, subsequent encounter for closed fracture with routine healing: Secondary | ICD-10-CM | POA: Diagnosis not present

## 2024-02-16 DIAGNOSIS — N1831 Chronic kidney disease, stage 3a: Secondary | ICD-10-CM | POA: Diagnosis not present

## 2024-02-16 DIAGNOSIS — F0283 Dementia in other diseases classified elsewhere, unspecified severity, with mood disturbance: Secondary | ICD-10-CM | POA: Diagnosis not present

## 2024-02-16 DIAGNOSIS — E785 Hyperlipidemia, unspecified: Secondary | ICD-10-CM | POA: Diagnosis not present

## 2024-02-16 DIAGNOSIS — Z96641 Presence of right artificial hip joint: Secondary | ICD-10-CM | POA: Diagnosis not present

## 2024-02-16 DIAGNOSIS — Z950 Presence of cardiac pacemaker: Secondary | ICD-10-CM | POA: Diagnosis not present

## 2024-02-16 DIAGNOSIS — S72001D Fracture of unspecified part of neck of right femur, subsequent encounter for closed fracture with routine healing: Secondary | ICD-10-CM | POA: Diagnosis not present

## 2024-02-19 ENCOUNTER — Ambulatory Visit (INDEPENDENT_AMBULATORY_CARE_PROVIDER_SITE_OTHER)

## 2024-02-19 DIAGNOSIS — I442 Atrioventricular block, complete: Secondary | ICD-10-CM

## 2024-02-20 DIAGNOSIS — G301 Alzheimer's disease with late onset: Secondary | ICD-10-CM | POA: Diagnosis not present

## 2024-02-20 DIAGNOSIS — I442 Atrioventricular block, complete: Secondary | ICD-10-CM | POA: Diagnosis not present

## 2024-02-20 DIAGNOSIS — M810 Age-related osteoporosis without current pathological fracture: Secondary | ICD-10-CM | POA: Diagnosis not present

## 2024-02-20 DIAGNOSIS — I1310 Hypertensive heart and chronic kidney disease without heart failure, with stage 1 through stage 4 chronic kidney disease, or unspecified chronic kidney disease: Secondary | ICD-10-CM | POA: Diagnosis not present

## 2024-02-20 DIAGNOSIS — S72001D Fracture of unspecified part of neck of right femur, subsequent encounter for closed fracture with routine healing: Secondary | ICD-10-CM | POA: Diagnosis not present

## 2024-02-20 DIAGNOSIS — N1831 Chronic kidney disease, stage 3a: Secondary | ICD-10-CM | POA: Diagnosis not present

## 2024-02-20 DIAGNOSIS — D631 Anemia in chronic kidney disease: Secondary | ICD-10-CM | POA: Diagnosis not present

## 2024-02-20 DIAGNOSIS — Z8781 Personal history of (healed) traumatic fracture: Secondary | ICD-10-CM | POA: Diagnosis not present

## 2024-02-20 DIAGNOSIS — Z9181 History of falling: Secondary | ICD-10-CM | POA: Diagnosis not present

## 2024-02-20 DIAGNOSIS — D649 Anemia, unspecified: Secondary | ICD-10-CM | POA: Diagnosis not present

## 2024-02-20 LAB — CUP PACEART REMOTE DEVICE CHECK
Battery Remaining Longevity: 55 mo
Battery Remaining Percentage: 52 %
Battery Voltage: 2.99 V
Brady Statistic AP VP Percent: 56 %
Brady Statistic AP VS Percent: 1 %
Brady Statistic AS VP Percent: 43 %
Brady Statistic AS VS Percent: 1 %
Brady Statistic RA Percent Paced: 56 %
Brady Statistic RV Percent Paced: 99 %
Date Time Interrogation Session: 20250524094137
Implantable Lead Connection Status: 753985
Implantable Lead Connection Status: 753985
Implantable Lead Implant Date: 20210510
Implantable Lead Implant Date: 20210510
Implantable Lead Location: 753859
Implantable Lead Location: 753860
Implantable Pulse Generator Implant Date: 20210510
Lead Channel Impedance Value: 550 Ohm
Lead Channel Impedance Value: 560 Ohm
Lead Channel Pacing Threshold Amplitude: 0.5 V
Lead Channel Pacing Threshold Amplitude: 0.75 V
Lead Channel Pacing Threshold Pulse Width: 0.5 ms
Lead Channel Pacing Threshold Pulse Width: 0.5 ms
Lead Channel Sensing Intrinsic Amplitude: 12 mV
Lead Channel Sensing Intrinsic Amplitude: 3.1 mV
Lead Channel Setting Pacing Amplitude: 2 V
Lead Channel Setting Pacing Amplitude: 2.5 V
Lead Channel Setting Pacing Pulse Width: 0.5 ms
Lead Channel Setting Sensing Sensitivity: 4 mV
Pulse Gen Model: 2272
Pulse Gen Serial Number: 3829878

## 2024-02-21 ENCOUNTER — Ambulatory Visit: Payer: Self-pay | Admitting: Internal Medicine

## 2024-02-23 DIAGNOSIS — I1310 Hypertensive heart and chronic kidney disease without heart failure, with stage 1 through stage 4 chronic kidney disease, or unspecified chronic kidney disease: Secondary | ICD-10-CM | POA: Diagnosis not present

## 2024-02-23 DIAGNOSIS — I442 Atrioventricular block, complete: Secondary | ICD-10-CM | POA: Diagnosis not present

## 2024-02-23 DIAGNOSIS — S72001D Fracture of unspecified part of neck of right femur, subsequent encounter for closed fracture with routine healing: Secondary | ICD-10-CM | POA: Diagnosis not present

## 2024-02-23 DIAGNOSIS — N1831 Chronic kidney disease, stage 3a: Secondary | ICD-10-CM | POA: Diagnosis not present

## 2024-02-23 DIAGNOSIS — G301 Alzheimer's disease with late onset: Secondary | ICD-10-CM | POA: Diagnosis not present

## 2024-02-23 DIAGNOSIS — D631 Anemia in chronic kidney disease: Secondary | ICD-10-CM | POA: Diagnosis not present

## 2024-02-27 DIAGNOSIS — D631 Anemia in chronic kidney disease: Secondary | ICD-10-CM | POA: Diagnosis not present

## 2024-02-27 DIAGNOSIS — S72001D Fracture of unspecified part of neck of right femur, subsequent encounter for closed fracture with routine healing: Secondary | ICD-10-CM | POA: Diagnosis not present

## 2024-02-27 DIAGNOSIS — I442 Atrioventricular block, complete: Secondary | ICD-10-CM | POA: Diagnosis not present

## 2024-02-27 DIAGNOSIS — N1831 Chronic kidney disease, stage 3a: Secondary | ICD-10-CM | POA: Diagnosis not present

## 2024-02-27 DIAGNOSIS — I1310 Hypertensive heart and chronic kidney disease without heart failure, with stage 1 through stage 4 chronic kidney disease, or unspecified chronic kidney disease: Secondary | ICD-10-CM | POA: Diagnosis not present

## 2024-02-27 DIAGNOSIS — G301 Alzheimer's disease with late onset: Secondary | ICD-10-CM | POA: Diagnosis not present

## 2024-02-28 DIAGNOSIS — I1310 Hypertensive heart and chronic kidney disease without heart failure, with stage 1 through stage 4 chronic kidney disease, or unspecified chronic kidney disease: Secondary | ICD-10-CM | POA: Diagnosis not present

## 2024-02-28 DIAGNOSIS — G301 Alzheimer's disease with late onset: Secondary | ICD-10-CM | POA: Diagnosis not present

## 2024-02-28 DIAGNOSIS — I442 Atrioventricular block, complete: Secondary | ICD-10-CM | POA: Diagnosis not present

## 2024-02-28 DIAGNOSIS — D631 Anemia in chronic kidney disease: Secondary | ICD-10-CM | POA: Diagnosis not present

## 2024-02-28 DIAGNOSIS — S72001D Fracture of unspecified part of neck of right femur, subsequent encounter for closed fracture with routine healing: Secondary | ICD-10-CM | POA: Diagnosis not present

## 2024-02-28 DIAGNOSIS — N1831 Chronic kidney disease, stage 3a: Secondary | ICD-10-CM | POA: Diagnosis not present

## 2024-03-05 DIAGNOSIS — S72001D Fracture of unspecified part of neck of right femur, subsequent encounter for closed fracture with routine healing: Secondary | ICD-10-CM | POA: Diagnosis not present

## 2024-03-05 DIAGNOSIS — G301 Alzheimer's disease with late onset: Secondary | ICD-10-CM | POA: Diagnosis not present

## 2024-03-05 DIAGNOSIS — N1831 Chronic kidney disease, stage 3a: Secondary | ICD-10-CM | POA: Diagnosis not present

## 2024-03-05 DIAGNOSIS — I1310 Hypertensive heart and chronic kidney disease without heart failure, with stage 1 through stage 4 chronic kidney disease, or unspecified chronic kidney disease: Secondary | ICD-10-CM | POA: Diagnosis not present

## 2024-03-05 DIAGNOSIS — D631 Anemia in chronic kidney disease: Secondary | ICD-10-CM | POA: Diagnosis not present

## 2024-03-05 DIAGNOSIS — I442 Atrioventricular block, complete: Secondary | ICD-10-CM | POA: Diagnosis not present

## 2024-03-06 DIAGNOSIS — I1310 Hypertensive heart and chronic kidney disease without heart failure, with stage 1 through stage 4 chronic kidney disease, or unspecified chronic kidney disease: Secondary | ICD-10-CM | POA: Diagnosis not present

## 2024-03-06 DIAGNOSIS — I442 Atrioventricular block, complete: Secondary | ICD-10-CM | POA: Diagnosis not present

## 2024-03-06 DIAGNOSIS — N1831 Chronic kidney disease, stage 3a: Secondary | ICD-10-CM | POA: Diagnosis not present

## 2024-03-06 DIAGNOSIS — S72001D Fracture of unspecified part of neck of right femur, subsequent encounter for closed fracture with routine healing: Secondary | ICD-10-CM | POA: Diagnosis not present

## 2024-03-06 DIAGNOSIS — D631 Anemia in chronic kidney disease: Secondary | ICD-10-CM | POA: Diagnosis not present

## 2024-03-06 DIAGNOSIS — G301 Alzheimer's disease with late onset: Secondary | ICD-10-CM | POA: Diagnosis not present

## 2024-03-08 ENCOUNTER — Ambulatory Visit (INDEPENDENT_AMBULATORY_CARE_PROVIDER_SITE_OTHER): Admitting: Physician Assistant

## 2024-03-08 ENCOUNTER — Encounter: Payer: Self-pay | Admitting: Physician Assistant

## 2024-03-08 ENCOUNTER — Other Ambulatory Visit (INDEPENDENT_AMBULATORY_CARE_PROVIDER_SITE_OTHER): Payer: Self-pay

## 2024-03-08 DIAGNOSIS — M25551 Pain in right hip: Secondary | ICD-10-CM

## 2024-03-08 NOTE — Progress Notes (Signed)
 Post-Op Visit Note   Patient: Erika [name]           Date of Birth: 26-Apr-1941           MRN: [id number] Visit Date: 03/08/2024 PCP: Tena Feeling, MD   Assessment & Plan:  Chief Complaint:  Chief Complaint  Patient presents with   Right Hip - Follow-up    Right total hip arthroplasty 01/22/2024   Visit Diagnoses:  1. Pain in right hip     Plan: Patient is a pleasant 83 year old female who comes in today 6 weeks status post right total hip replacement from a femoral neck fracture, date of surgery 01/22/2024.  She is doing better.  She is getting therapy once weekly and working on a home exercise program.  Currently ambulating with a walker.  Prior to her fall, she was ambulating unassisted.  She notes some sensitivity to the incision but nothing to the actual hip joint.  Examination of the right hip reveals painless hip flexion.  She is neurovascularly intact distally.  At this point, she Stotler use lotion to desensitize the incision.  Continue with her exercises.  She will follow-up in 6 weeks for repeat evaluation and AP pelvis x-rays.  Call with concerns or questions.  Follow-Up Instructions: Return in about 6 weeks (around 04/19/2024).   Orders:  Orders Placed This Encounter  Procedures   XR HIP UNILAT W OR W/O PELVIS 2-3 VIEWS RIGHT   No orders of the defined types were placed in this encounter.   Imaging: XR HIP UNILAT W OR W/O PELVIS 2-3 VIEWS RIGHT Result Date: 03/08/2024 Well-seated prosthesis without complication   PMFS History: Patient Active Problem List   Diagnosis Date Noted   Closed subcapital fracture of neck of right femur, initial encounter (HCC) 01/22/2024   Late onset Alzheimer's dementia without behavioral disturbance, psychotic disturbance, mood disturbance, or anxiety (HCC) 07/29/2021   S/P placement of cardiac pacemaker 07/29/2021   Hypertension 08/13/2020   Pacemaker 07/27/2020   Complete heart block (HCC) 02/02/2020   Spells of decreased  attentiveness 01/23/2020   Dementia without behavioral disturbance (HCC) 01/23/2020   Hiatal hernia with obstruction but no gangrene 10/29/2018   Exertional dyspnea    Chest pain    Second degree AV block, Mobitz type II 09/10/2018   Pre-operative cardiovascular examination 09/10/2018   Neck pain on right side 05/13/2013   Right shoulder pain 04/23/2013   Hyperlipidemia 08/28/2012   Irritable bowel syndrome 08/28/2012   Past Medical History:  Diagnosis Date   Absence of appendix, congenital    Arthritis    Complete heart block (HCC) 02/02/2020   GERD (gastroesophageal reflux disease)    Hiatal hernia    History of colon polyps    History of gastric polyp    Hypercholesterolemia    Hypertension    PER PT NO MEDICINE   Irritable bowel syndrome    resolved last few years   Mobitz type 2 second degree heart block    intermittant,  cardiac work-up done;  Cardiac cath 10-02-2018,  echo 09-24-2018, event monitor 09-24-2018 (all results in epic)   Neck pain    right sided neck pain   OSA (obstructive sleep apnea)    does not use c-pap did not like   Osteoporosis 06/2017   T score -2.8   PSVT (paroxysmal supraventricular tachycardia) (HCC)    short run on event monitor 09-24-2018   Wears glasses     Family History  Problem Relation Age  of Onset   Heart disease Father        MI   Kidney failure Mother        pregnant with twins   Heart disease Paternal Uncle        MI   Heart disease Paternal Uncle        MI   Heart disease Paternal Uncle        MI   Heart disease Paternal Uncle        MI   Colon cancer Neg Hx    Liver cancer Neg Hx    Esophageal cancer Neg Hx    Rectal cancer Neg Hx    Stomach cancer Neg Hx     Past Surgical History:  Procedure Laterality Date   CATARACT EXTRACTION W/ INTRAOCULAR LENS  IMPLANT, BILATERAL     COLONOSCOPY     cortisone shots in back     ESOPHAGOGASTRODUODENOSCOPY     ESOPHAGOGASTRODUODENOSCOPY N/A 08/14/2013   Procedure:  ESOPHAGOGASTRODUODENOSCOPY (EGD);  Surgeon: Mathew Solomon, MD;  Location: [facility];  Service: Endoscopy;  Laterality: N/A;   ESOPHAGOGASTRODUODENOSCOPY (EGD) WITH PROPOFOL  N/A 09/29/2015   Procedure: ESOPHAGOGASTRODUODENOSCOPY (EGD) WITH PROPOFOL ;  Surgeon: Ozell Blunt, MD;  Location: [facility];  Service: Endoscopy;  Laterality: N/A;   FOOT SURGERY Right 06-30-2005   dr duda   osteotomy first and second toe's   HIATAL HERNIA REPAIR N/A 10/29/2018   Procedure: LAPAROSCOPIC REPAIR OF HIATAL HERNIA WITH NISSEN FUNDOPLICATION, WITH MESH;  Surgeon: Ayesha Lente, MD;  Location: [facility];  Service: General;  Laterality: N/A;   HOT HEMOSTASIS N/A 08/14/2013   Procedure: HOT HEMOSTASIS (ARGON PLASMA COAGULATION/BICAP);  Surgeon: Mathew Solomon, MD;  Location: [facility];  Service: Endoscopy;  Laterality: N/A;   HOT HEMOSTASIS N/A 09/29/2015   Procedure: HOT HEMOSTASIS (ARGON PLASMA COAGULATION/BICAP);  Surgeon: Ozell Blunt, MD;  Location: [facility];  Service: Endoscopy;  Laterality: N/A;   PACEMAKER IMPLANT N/A 02/03/2020   Procedure: PACEMAKER IMPLANT;  Surgeon: Tammie Fall, MD;  Location: [facility];  Service: Cardiovascular;  Laterality: N/A;   RIGHT/LEFT HEART CATH AND CORONARY ANGIOGRAPHY N/A 10/02/2018   Procedure: RIGHT/LEFT HEART CATH AND CORONARY ANGIOGRAPHY;  Surgeon: Millicent Ally, MD;  Location: [facility];  Service: Cardiovascular;  Laterality: N/A;   TOTAL HIP ARTHROPLASTY Right 01/22/2024   Procedure: ARTHROPLASTY, HIP, TOTAL, ANTERIOR APPROACH;  Surgeon: Wes Hamman, MD;  Location: [facility];  Service: Orthopedics;  Laterality: Right;   TUBAL LIGATION  yrs ago   Social History   Occupational History    Employer: RETIRED    Comment: Retired  Tobacco Use   Smoking status: Never   Smokeless tobacco: Never  Vaping Use   Vaping status: Never Used  Substance and Sexual Activity   Alcohol use: No   Drug use: No   Sexual activity: Never    Comment: 1st intercourse  1 yo-1 partner

## 2024-03-12 DIAGNOSIS — G301 Alzheimer's disease with late onset: Secondary | ICD-10-CM | POA: Diagnosis not present

## 2024-03-12 DIAGNOSIS — S72001D Fracture of unspecified part of neck of right femur, subsequent encounter for closed fracture with routine healing: Secondary | ICD-10-CM | POA: Diagnosis not present

## 2024-03-12 DIAGNOSIS — N1831 Chronic kidney disease, stage 3a: Secondary | ICD-10-CM | POA: Diagnosis not present

## 2024-03-12 DIAGNOSIS — I1310 Hypertensive heart and chronic kidney disease without heart failure, with stage 1 through stage 4 chronic kidney disease, or unspecified chronic kidney disease: Secondary | ICD-10-CM | POA: Diagnosis not present

## 2024-03-12 DIAGNOSIS — D631 Anemia in chronic kidney disease: Secondary | ICD-10-CM | POA: Diagnosis not present

## 2024-03-12 DIAGNOSIS — I442 Atrioventricular block, complete: Secondary | ICD-10-CM | POA: Diagnosis not present

## 2024-03-13 DIAGNOSIS — S72001D Fracture of unspecified part of neck of right femur, subsequent encounter for closed fracture with routine healing: Secondary | ICD-10-CM | POA: Diagnosis not present

## 2024-03-13 DIAGNOSIS — I442 Atrioventricular block, complete: Secondary | ICD-10-CM | POA: Diagnosis not present

## 2024-03-13 DIAGNOSIS — G301 Alzheimer's disease with late onset: Secondary | ICD-10-CM | POA: Diagnosis not present

## 2024-03-13 DIAGNOSIS — I1310 Hypertensive heart and chronic kidney disease without heart failure, with stage 1 through stage 4 chronic kidney disease, or unspecified chronic kidney disease: Secondary | ICD-10-CM | POA: Diagnosis not present

## 2024-03-13 DIAGNOSIS — N1831 Chronic kidney disease, stage 3a: Secondary | ICD-10-CM | POA: Diagnosis not present

## 2024-03-13 DIAGNOSIS — D631 Anemia in chronic kidney disease: Secondary | ICD-10-CM | POA: Diagnosis not present

## 2024-03-17 DIAGNOSIS — I1310 Hypertensive heart and chronic kidney disease without heart failure, with stage 1 through stage 4 chronic kidney disease, or unspecified chronic kidney disease: Secondary | ICD-10-CM | POA: Diagnosis not present

## 2024-03-17 DIAGNOSIS — I442 Atrioventricular block, complete: Secondary | ICD-10-CM | POA: Diagnosis not present

## 2024-03-17 DIAGNOSIS — Z9181 History of falling: Secondary | ICD-10-CM | POA: Diagnosis not present

## 2024-03-17 DIAGNOSIS — K589 Irritable bowel syndrome without diarrhea: Secondary | ICD-10-CM | POA: Diagnosis not present

## 2024-03-17 DIAGNOSIS — S72011D Unspecified intracapsular fracture of right femur, subsequent encounter for closed fracture with routine healing: Secondary | ICD-10-CM | POA: Diagnosis not present

## 2024-03-17 DIAGNOSIS — D631 Anemia in chronic kidney disease: Secondary | ICD-10-CM | POA: Diagnosis not present

## 2024-03-17 DIAGNOSIS — Z96641 Presence of right artificial hip joint: Secondary | ICD-10-CM | POA: Diagnosis not present

## 2024-03-17 DIAGNOSIS — F0283 Dementia in other diseases classified elsewhere, unspecified severity, with mood disturbance: Secondary | ICD-10-CM | POA: Diagnosis not present

## 2024-03-17 DIAGNOSIS — E785 Hyperlipidemia, unspecified: Secondary | ICD-10-CM | POA: Diagnosis not present

## 2024-03-17 DIAGNOSIS — N1831 Chronic kidney disease, stage 3a: Secondary | ICD-10-CM | POA: Diagnosis not present

## 2024-03-17 DIAGNOSIS — K449 Diaphragmatic hernia without obstruction or gangrene: Secondary | ICD-10-CM | POA: Diagnosis not present

## 2024-03-17 DIAGNOSIS — Z950 Presence of cardiac pacemaker: Secondary | ICD-10-CM | POA: Diagnosis not present

## 2024-03-17 DIAGNOSIS — F32A Depression, unspecified: Secondary | ICD-10-CM | POA: Diagnosis not present

## 2024-03-17 DIAGNOSIS — R32 Unspecified urinary incontinence: Secondary | ICD-10-CM | POA: Diagnosis not present

## 2024-03-17 DIAGNOSIS — G301 Alzheimer's disease with late onset: Secondary | ICD-10-CM | POA: Diagnosis not present

## 2024-03-17 DIAGNOSIS — Z556 Problems related to health literacy: Secondary | ICD-10-CM | POA: Diagnosis not present

## 2024-03-17 DIAGNOSIS — S72001D Fracture of unspecified part of neck of right femur, subsequent encounter for closed fracture with routine healing: Secondary | ICD-10-CM | POA: Diagnosis not present

## 2024-03-19 DIAGNOSIS — S72001D Fracture of unspecified part of neck of right femur, subsequent encounter for closed fracture with routine healing: Secondary | ICD-10-CM | POA: Diagnosis not present

## 2024-03-19 DIAGNOSIS — I442 Atrioventricular block, complete: Secondary | ICD-10-CM | POA: Diagnosis not present

## 2024-03-19 DIAGNOSIS — G301 Alzheimer's disease with late onset: Secondary | ICD-10-CM | POA: Diagnosis not present

## 2024-03-19 DIAGNOSIS — N1831 Chronic kidney disease, stage 3a: Secondary | ICD-10-CM | POA: Diagnosis not present

## 2024-03-19 DIAGNOSIS — D631 Anemia in chronic kidney disease: Secondary | ICD-10-CM | POA: Diagnosis not present

## 2024-03-19 DIAGNOSIS — I1310 Hypertensive heart and chronic kidney disease without heart failure, with stage 1 through stage 4 chronic kidney disease, or unspecified chronic kidney disease: Secondary | ICD-10-CM | POA: Diagnosis not present

## 2024-03-26 DIAGNOSIS — I442 Atrioventricular block, complete: Secondary | ICD-10-CM | POA: Diagnosis not present

## 2024-03-26 DIAGNOSIS — D631 Anemia in chronic kidney disease: Secondary | ICD-10-CM | POA: Diagnosis not present

## 2024-03-26 DIAGNOSIS — I1310 Hypertensive heart and chronic kidney disease without heart failure, with stage 1 through stage 4 chronic kidney disease, or unspecified chronic kidney disease: Secondary | ICD-10-CM | POA: Diagnosis not present

## 2024-03-26 DIAGNOSIS — S72001D Fracture of unspecified part of neck of right femur, subsequent encounter for closed fracture with routine healing: Secondary | ICD-10-CM | POA: Diagnosis not present

## 2024-03-26 DIAGNOSIS — N1831 Chronic kidney disease, stage 3a: Secondary | ICD-10-CM | POA: Diagnosis not present

## 2024-03-26 DIAGNOSIS — G301 Alzheimer's disease with late onset: Secondary | ICD-10-CM | POA: Diagnosis not present

## 2024-04-02 DIAGNOSIS — N1831 Chronic kidney disease, stage 3a: Secondary | ICD-10-CM | POA: Diagnosis not present

## 2024-04-02 DIAGNOSIS — G301 Alzheimer's disease with late onset: Secondary | ICD-10-CM | POA: Diagnosis not present

## 2024-04-02 DIAGNOSIS — I1310 Hypertensive heart and chronic kidney disease without heart failure, with stage 1 through stage 4 chronic kidney disease, or unspecified chronic kidney disease: Secondary | ICD-10-CM | POA: Diagnosis not present

## 2024-04-02 DIAGNOSIS — I442 Atrioventricular block, complete: Secondary | ICD-10-CM | POA: Diagnosis not present

## 2024-04-02 DIAGNOSIS — D631 Anemia in chronic kidney disease: Secondary | ICD-10-CM | POA: Diagnosis not present

## 2024-04-02 DIAGNOSIS — S72001D Fracture of unspecified part of neck of right femur, subsequent encounter for closed fracture with routine healing: Secondary | ICD-10-CM | POA: Diagnosis not present

## 2024-04-05 NOTE — Addendum Note (Signed)
 Addended by: TAWNI DRILLING D on: 04/05/2024 12:15 PM   Modules accepted: Orders

## 2024-04-05 NOTE — Progress Notes (Signed)
 Remote pacemaker transmission.

## 2024-04-09 DIAGNOSIS — D631 Anemia in chronic kidney disease: Secondary | ICD-10-CM | POA: Diagnosis not present

## 2024-04-09 DIAGNOSIS — G301 Alzheimer's disease with late onset: Secondary | ICD-10-CM | POA: Diagnosis not present

## 2024-04-09 DIAGNOSIS — N1831 Chronic kidney disease, stage 3a: Secondary | ICD-10-CM | POA: Diagnosis not present

## 2024-04-09 DIAGNOSIS — I1310 Hypertensive heart and chronic kidney disease without heart failure, with stage 1 through stage 4 chronic kidney disease, or unspecified chronic kidney disease: Secondary | ICD-10-CM | POA: Diagnosis not present

## 2024-04-09 DIAGNOSIS — S72001D Fracture of unspecified part of neck of right femur, subsequent encounter for closed fracture with routine healing: Secondary | ICD-10-CM | POA: Diagnosis not present

## 2024-04-09 DIAGNOSIS — I442 Atrioventricular block, complete: Secondary | ICD-10-CM | POA: Diagnosis not present

## 2024-04-23 ENCOUNTER — Encounter: Admitting: Orthopaedic Surgery

## 2024-04-29 ENCOUNTER — Ambulatory Visit: Payer: Medicare Other

## 2024-05-02 ENCOUNTER — Other Ambulatory Visit (INDEPENDENT_AMBULATORY_CARE_PROVIDER_SITE_OTHER): Payer: Self-pay

## 2024-05-02 ENCOUNTER — Ambulatory Visit (INDEPENDENT_AMBULATORY_CARE_PROVIDER_SITE_OTHER): Admitting: Orthopaedic Surgery

## 2024-05-02 DIAGNOSIS — M25551 Pain in right hip: Secondary | ICD-10-CM | POA: Diagnosis not present

## 2024-05-02 DIAGNOSIS — S72011A Unspecified intracapsular fracture of right femur, initial encounter for closed fracture: Secondary | ICD-10-CM

## 2024-05-02 NOTE — Progress Notes (Signed)
 Post-Op Visit Note   Patient: Erika [name]           Date of Birth: 28-May-1941           MRN: [id number] Visit Date: 05/02/2024 PCP: Dwight Trula SQUIBB, MD   Assessment & Plan:  Chief Complaint:  Chief Complaint  Patient presents with   Right Hip - Follow-up    Right total hip arthroplasty 01/22/2024   Visit Diagnoses:  1. Closed subcapital fracture of neck of right femur, initial encounter (HCC)   2. Pain in right hip     Plan: History of Present Illness Erika [name] is an 83 year old female who presents for follow-up after hip replacement surgery. She is accompanied by her daughter.  She is three months and ten days post-hip replacement surgery due to a hip fracture from a fall. She uses a walker for ambulation, primarily for security and stability, especially on uneven ground. At home, she does not use the walker as she is familiar with her surroundings. When outside, she uses a rollator with a seat and larger wheels for better support on uneven surfaces. She experiences no significant pain, although there is some mild morning stiffness after lying down for a while.  Physical Exam MUSCULOSKELETAL: Tenderness over hip incision.  Surgical scar is fully healed.  Fluid painless range of motion.  Assessment and Plan Status post right hip replacement for prior right femoral neck fracture Reporting mild morning stiffness and incision site tenderness. X-rays confirm proper implant positioning. - Continue rehabilitation and strengthening exercises. - No regular follow-up unless issues arise. - Provided contact information for future concerns.  Follow-Up Instructions: Return if symptoms worsen or fail to improve.   Orders:  Orders Placed This Encounter  Procedures   XR Pelvis 1-2 Views   No orders of the defined types were placed in this encounter.   Imaging: XR Pelvis 1-2 Views Result Date: 05/02/2024 Stable total hip replacement without complications   PMFS History: Patient  Active Problem List   Diagnosis Date Noted   Closed subcapital fracture of neck of right femur, initial encounter (HCC) 01/22/2024   Late onset Alzheimer's dementia without behavioral disturbance, psychotic disturbance, mood disturbance, or anxiety (HCC) 07/29/2021   S/P placement of cardiac pacemaker 07/29/2021   Hypertension 08/13/2020   Pacemaker 07/27/2020   Complete heart block (HCC) 02/02/2020   Spells of decreased attentiveness 01/23/2020   Dementia without behavioral disturbance (HCC) 01/23/2020   Hiatal hernia with obstruction but no gangrene 10/29/2018   Exertional dyspnea    Chest pain    Second degree AV block, Mobitz type II 09/10/2018   Pre-operative cardiovascular examination 09/10/2018   Neck pain on right side 05/13/2013   Right shoulder pain 04/23/2013   Hyperlipidemia 08/28/2012   Irritable bowel syndrome 08/28/2012   Past Medical History:  Diagnosis Date   Absence of appendix, congenital    Arthritis    Complete heart block (HCC) 02/02/2020   GERD (gastroesophageal reflux disease)    Hiatal hernia    History of colon polyps    History of gastric polyp    Hypercholesterolemia    Hypertension    PER PT NO MEDICINE   Irritable bowel syndrome    resolved last few years   Mobitz type 2 second degree heart block    intermittant,  cardiac work-up done;  Cardiac cath 10-02-2018,  echo 09-24-2018, event monitor 09-24-2018 (all results in epic)   Neck pain    right sided neck  pain   OSA (obstructive sleep apnea)    does not use c-pap did not like   Osteoporosis 06/2017   T score -2.8   PSVT (paroxysmal supraventricular tachycardia) (HCC)    short run on event monitor 09-24-2018   Wears glasses     Family History  Problem Relation Age of Onset   Heart disease Father        MI   Kidney failure Mother        pregnant with twins   Heart disease Paternal Uncle        MI   Heart disease Paternal Uncle        MI   Heart disease Paternal Uncle        MI    Heart disease Paternal Uncle        MI   Colon cancer Neg Hx    Liver cancer Neg Hx    Esophageal cancer Neg Hx    Rectal cancer Neg Hx    Stomach cancer Neg Hx     Past Surgical History:  Procedure Laterality Date   CATARACT EXTRACTION W/ INTRAOCULAR LENS  IMPLANT, BILATERAL     COLONOSCOPY     cortisone shots in back     ESOPHAGOGASTRODUODENOSCOPY     ESOPHAGOGASTRODUODENOSCOPY N/A 08/14/2013   Procedure: ESOPHAGOGASTRODUODENOSCOPY (EGD);  Surgeon: Oliva FORBES Boots, MD;  Location: [facility];  Service: Endoscopy;  Laterality: N/A;   ESOPHAGOGASTRODUODENOSCOPY (EGD) WITH PROPOFOL  N/A 09/29/2015   Procedure: ESOPHAGOGASTRODUODENOSCOPY (EGD) WITH PROPOFOL ;  Surgeon: Oliva Boots, MD;  Location: [facility];  Service: Endoscopy;  Laterality: N/A;   FOOT SURGERY Right 06-30-2005   dr duda   osteotomy first and second toe's   HIATAL HERNIA REPAIR N/A 10/29/2018   Procedure: LAPAROSCOPIC REPAIR OF HIATAL HERNIA WITH NISSEN FUNDOPLICATION, WITH MESH;  Surgeon: Mikell Katz, MD;  Location: [facility];  Service: General;  Laterality: N/A;   HOT HEMOSTASIS N/A 08/14/2013   Procedure: HOT HEMOSTASIS (ARGON PLASMA COAGULATION/BICAP);  Surgeon: Oliva FORBES Boots, MD;  Location: [facility];  Service: Endoscopy;  Laterality: N/A;   HOT HEMOSTASIS N/A 09/29/2015   Procedure: HOT HEMOSTASIS (ARGON PLASMA COAGULATION/BICAP);  Surgeon: Oliva Boots, MD;  Location: [facility];  Service: Endoscopy;  Laterality: N/A;   PACEMAKER IMPLANT N/A 02/03/2020   Procedure: PACEMAKER IMPLANT;  Surgeon: Waddell Danelle ORN, MD;  Location: [facility];  Service: Cardiovascular;  Laterality: N/A;   RIGHT/LEFT HEART CATH AND CORONARY ANGIOGRAPHY N/A 10/02/2018   Procedure: RIGHT/LEFT HEART CATH AND CORONARY ANGIOGRAPHY;  Surgeon: Burnard Debby LABOR, MD;  Location: [facility];  Service: Cardiovascular;  Laterality: N/A;   TOTAL HIP ARTHROPLASTY Right 01/22/2024   Procedure: ARTHROPLASTY, HIP, TOTAL, ANTERIOR APPROACH;   Surgeon: Jerri Kay HERO, MD;  Location: [facility];  Service: Orthopedics;  Laterality: Right;   TUBAL LIGATION  yrs ago   Social History   Occupational History    Employer: RETIRED    Comment: Retired  Tobacco Use   Smoking status: Never   Smokeless tobacco: Never  Vaping Use   Vaping status: Never Used  Substance and Sexual Activity   Alcohol use: No   Drug use: No   Sexual activity: Never    Comment: 1st intercourse 29 yo-1 partner

## 2024-05-20 ENCOUNTER — Ambulatory Visit

## 2024-06-10 ENCOUNTER — Ambulatory Visit (INDEPENDENT_AMBULATORY_CARE_PROVIDER_SITE_OTHER)

## 2024-06-10 DIAGNOSIS — I442 Atrioventricular block, complete: Secondary | ICD-10-CM

## 2024-06-11 LAB — CUP PACEART REMOTE DEVICE CHECK
Battery Remaining Longevity: 50 mo
Battery Remaining Percentage: 49 %
Battery Voltage: 2.99 V
Brady Statistic AP VP Percent: 56 %
Brady Statistic AP VS Percent: 1 %
Brady Statistic AS VP Percent: 43 %
Brady Statistic AS VS Percent: 1 %
Brady Statistic RA Percent Paced: 56 %
Brady Statistic RV Percent Paced: 99 %
Date Time Interrogation Session: 20250913163356
Implantable Lead Connection Status: 753985
Implantable Lead Connection Status: 753985
Implantable Lead Implant Date: 20210510
Implantable Lead Implant Date: 20210510
Implantable Lead Location: 753859
Implantable Lead Location: 753860
Implantable Pulse Generator Implant Date: 20210510
Lead Channel Impedance Value: 490 Ohm
Lead Channel Impedance Value: 540 Ohm
Lead Channel Pacing Threshold Amplitude: 0.5 V
Lead Channel Pacing Threshold Amplitude: 0.75 V
Lead Channel Pacing Threshold Pulse Width: 0.5 ms
Lead Channel Pacing Threshold Pulse Width: 0.5 ms
Lead Channel Sensing Intrinsic Amplitude: 12 mV
Lead Channel Sensing Intrinsic Amplitude: 3.1 mV
Lead Channel Setting Pacing Amplitude: 2 V
Lead Channel Setting Pacing Amplitude: 2.5 V
Lead Channel Setting Pacing Pulse Width: 0.5 ms
Lead Channel Setting Sensing Sensitivity: 4 mV
Pulse Gen Model: 2272
Pulse Gen Serial Number: 3829878

## 2024-06-12 DIAGNOSIS — Z23 Encounter for immunization: Secondary | ICD-10-CM | POA: Diagnosis not present

## 2024-06-14 ENCOUNTER — Ambulatory Visit: Payer: Self-pay | Admitting: Internal Medicine

## 2024-06-17 NOTE — Progress Notes (Signed)
 Remote PPM Transmission

## 2024-07-15 DIAGNOSIS — Z23 Encounter for immunization: Secondary | ICD-10-CM | POA: Diagnosis not present

## 2024-07-15 DIAGNOSIS — N1831 Chronic kidney disease, stage 3a: Secondary | ICD-10-CM | POA: Diagnosis not present

## 2024-07-15 DIAGNOSIS — R21 Rash and other nonspecific skin eruption: Secondary | ICD-10-CM | POA: Diagnosis not present

## 2024-07-15 DIAGNOSIS — I129 Hypertensive chronic kidney disease with stage 1 through stage 4 chronic kidney disease, or unspecified chronic kidney disease: Secondary | ICD-10-CM | POA: Diagnosis not present

## 2024-07-15 DIAGNOSIS — R600 Localized edema: Secondary | ICD-10-CM | POA: Diagnosis not present

## 2024-07-29 ENCOUNTER — Encounter: Payer: Self-pay | Admitting: Radiology

## 2024-07-29 ENCOUNTER — Ambulatory Visit: Payer: Medicare Other

## 2024-08-01 DIAGNOSIS — H6123 Impacted cerumen, bilateral: Secondary | ICD-10-CM | POA: Diagnosis not present

## 2024-08-01 DIAGNOSIS — H9193 Unspecified hearing loss, bilateral: Secondary | ICD-10-CM | POA: Diagnosis not present

## 2024-08-19 ENCOUNTER — Ambulatory Visit

## 2024-08-29 ENCOUNTER — Ambulatory Visit: Admitting: Internal Medicine

## 2024-09-09 ENCOUNTER — Ambulatory Visit

## 2024-09-09 DIAGNOSIS — I442 Atrioventricular block, complete: Secondary | ICD-10-CM

## 2024-09-11 LAB — CUP PACEART REMOTE DEVICE CHECK
Battery Remaining Longevity: 48 mo
Battery Remaining Percentage: 46 %
Battery Voltage: 2.99 V
Brady Statistic AP VP Percent: 57 %
Brady Statistic AP VS Percent: 1 %
Brady Statistic AS VP Percent: 43 %
Brady Statistic AS VS Percent: 1 %
Brady Statistic RA Percent Paced: 56 %
Brady Statistic RV Percent Paced: 99 %
Date Time Interrogation Session: 20251215121250
Implantable Lead Connection Status: 753985
Implantable Lead Connection Status: 753985
Implantable Lead Implant Date: 20210510
Implantable Lead Implant Date: 20210510
Implantable Lead Location: 753859
Implantable Lead Location: 753860
Implantable Pulse Generator Implant Date: 20210510
Lead Channel Impedance Value: 460 Ohm
Lead Channel Impedance Value: 510 Ohm
Lead Channel Pacing Threshold Amplitude: 0.5 V
Lead Channel Pacing Threshold Amplitude: 0.75 V
Lead Channel Pacing Threshold Pulse Width: 0.5 ms
Lead Channel Pacing Threshold Pulse Width: 0.5 ms
Lead Channel Sensing Intrinsic Amplitude: 2.6 mV
Lead Channel Sensing Intrinsic Amplitude: 6.9 mV
Lead Channel Setting Pacing Amplitude: 2 V
Lead Channel Setting Pacing Amplitude: 2.5 V
Lead Channel Setting Pacing Pulse Width: 0.5 ms
Lead Channel Setting Sensing Sensitivity: 4 mV
Pulse Gen Model: 2272
Pulse Gen Serial Number: 3829878

## 2024-09-12 NOTE — Progress Notes (Unsigned)
°  Electrophysiology Office Note:   Date:  09/12/2024  ID:  Erika [name], DOB [birth year], MRN [id number]  Primary Cardiologist: Vinie JAYSON Maxcy, MD Primary Heart Failure: None Electrophysiologist: Donnice DELENA Primus, MD   {Click to update primary MD,subspecialty MD or APP then REFRESH:1}    History of Present Illness:   Erika [name] is a 83 y.o. female with h/o CHB s/p PPM, HTN, HLD, IBS, Dementia seen today for routine electrophysiology followup.   Since last being seen in our clinic the patient reports doing ***.    She ***denies chest pain, palpitations, dyspnea, PND, orthopnea, nausea, vomiting, dizziness, syncope, edema, weight gain, or early satiety.   Review of systems complete and found to be negative unless listed in HPI.    EP Information / Studies Reviewed:    EKG is not ordered today. EKG from 01/24/24 reviewed which showed AV paced at 60 bpm      PPM Interrogation-  reviewed in detail today,  See PACEART report.  Device History: Abbott Dual Chamber PPM implanted 02/03/2020 for CHB  Risk Assessment/Calculations:     No BP recorded.  {Refresh Note OR Click here to enter BP  :1}***        Physical Exam:   VS:  There were no vitals taken for this visit.   Wt Readings from Last 3 Encounters:  01/22/24 (P) 130 lb (59 kg)  08/29/23 130 lb 3.2 oz (59.1 kg)  08/11/22 127 lb (57.6 kg)     GEN: Well nourished, well developed in no acute distress NECK: No JVD; No carotid bruits CARDIAC: {EPRHYTHM:28826}, no murmurs, rubs, gallops RESPIRATORY:  Clear to auscultation without rales, wheezing or rhonchi  ABDOMEN: Soft, non-tender, non-distended EXTREMITIES:  No edema; No deformity   ASSESSMENT AND PLAN:    CHB s/p Abbott PPM  -Normal PPM function -See Pace Art report -No changes today  Hypertension  -well controlled on current regimen ***  Dementia  -per primary / neurology   Disposition:   Follow up with Dr. Primus {EPFOLLOW LE:71826}  Signed, Daphne Barrack,  NP-C, AGACNP-BC [facility] - Electrophysiology  09/12/2024, 1:09 PM

## 2024-09-13 ENCOUNTER — Encounter: Payer: Self-pay | Admitting: Pulmonary Disease

## 2024-09-13 ENCOUNTER — Ambulatory Visit: Attending: Pulmonary Disease | Admitting: Pulmonary Disease

## 2024-09-13 VITALS — BP 148/74 | HR 60 | Ht 61.0 in | Wt 133.0 lb

## 2024-09-13 DIAGNOSIS — Z950 Presence of cardiac pacemaker: Secondary | ICD-10-CM | POA: Diagnosis not present

## 2024-09-13 DIAGNOSIS — I442 Atrioventricular block, complete: Secondary | ICD-10-CM | POA: Diagnosis not present

## 2024-09-13 LAB — CUP PACEART INCLINIC DEVICE CHECK
Battery Remaining Longevity: 49 mo
Battery Voltage: 2.98 V
Brady Statistic RA Percent Paced: 57 %
Brady Statistic RV Percent Paced: 99.81 %
Date Time Interrogation Session: 20251219124903
Implantable Lead Connection Status: 753985
Implantable Lead Connection Status: 753985
Implantable Lead Implant Date: 20210510
Implantable Lead Implant Date: 20210510
Implantable Lead Location: 753859
Implantable Lead Location: 753860
Implantable Pulse Generator Implant Date: 20210510
Lead Channel Impedance Value: 512.5 Ohm
Lead Channel Impedance Value: 587.5 Ohm
Lead Channel Pacing Threshold Amplitude: 0.5 V
Lead Channel Pacing Threshold Amplitude: 0.5 V
Lead Channel Pacing Threshold Amplitude: 0.75 V
Lead Channel Pacing Threshold Amplitude: 0.75 V
Lead Channel Pacing Threshold Pulse Width: 0.5 ms
Lead Channel Pacing Threshold Pulse Width: 0.5 ms
Lead Channel Pacing Threshold Pulse Width: 0.5 ms
Lead Channel Pacing Threshold Pulse Width: 0.5 ms
Lead Channel Sensing Intrinsic Amplitude: 3.1 mV
Lead Channel Setting Pacing Amplitude: 2 V
Lead Channel Setting Pacing Amplitude: 2.5 V
Lead Channel Setting Pacing Pulse Width: 0.5 ms
Lead Channel Setting Sensing Sensitivity: 4 mV
Pulse Gen Model: 2272
Pulse Gen Serial Number: 3829878

## 2024-09-13 NOTE — Progress Notes (Signed)
 Remote PPM Transmission

## 2024-09-13 NOTE — Patient Instructions (Signed)
 Medication Instructions:  Your physician recommends that you continue on your current medications as directed. Please refer to the Current Medication list given to you today.  *If you need a refill on your cardiac medications before your next appointment, please call your pharmacy*  Lab Work: None ordered If you have labs (blood work) drawn today and your tests are completely normal, you will receive your results only by: MyChart Message (if you have MyChart) OR A paper copy in the mail If you have any lab test that is abnormal or we need to change your treatment, we will call you to review the results.  Follow-Up: At [facility], you and your health needs are our priority.  As part of our continuing mission to provide you with exceptional heart care, our providers are all part of one team.  This team includes your primary Cardiologist (physician) and Advanced Practice Providers or APPs (Physician Assistants and Nurse Practitioners) who all work together to provide you with the care you need, when you need it.  Your next appointment:   1 year(s)  Provider:   Donnice Primus, MD or Daphne Barrack, NP

## 2024-09-20 ENCOUNTER — Ambulatory Visit: Payer: Self-pay | Admitting: Student in an Organized Health Care Education/Training Program

## 2024-09-22 ENCOUNTER — Ambulatory Visit: Payer: Self-pay | Admitting: Internal Medicine

## 2024-10-28 ENCOUNTER — Ambulatory Visit: Payer: Medicare Other

## 2024-11-18 ENCOUNTER — Ambulatory Visit

## 2024-12-09 ENCOUNTER — Ambulatory Visit

## 2025-02-18 ENCOUNTER — Ambulatory Visit

## 2025-03-10 ENCOUNTER — Ambulatory Visit

## 2025-05-19 ENCOUNTER — Ambulatory Visit

## 2025-06-09 ENCOUNTER — Ambulatory Visit

## 2025-08-18 ENCOUNTER — Ambulatory Visit

## 2025-11-17 ENCOUNTER — Ambulatory Visit

## 2026-02-16 ENCOUNTER — Ambulatory Visit

## 2026-05-18 ENCOUNTER — Ambulatory Visit

## 2026-08-17 ENCOUNTER — Ambulatory Visit
